# Patient Record
Sex: Female | Born: 1980
Health system: Southern US, Community
[De-identification: ages and names within clinical notes are randomized; demographics above are authoritative.]

## PROBLEM LIST (undated history)

## (undated) DIAGNOSIS — R600 Localized edema: Secondary | ICD-10-CM

## (undated) DIAGNOSIS — Z0000 Encounter for general adult medical examination without abnormal findings: Secondary | ICD-10-CM

## (undated) DIAGNOSIS — Z8759 Personal history of other complications of pregnancy, childbirth and the puerperium: Secondary | ICD-10-CM

## (undated) DIAGNOSIS — E669 Obesity, unspecified: Secondary | ICD-10-CM

## (undated) HISTORY — DX: Localized edema: R60.0

## (undated) HISTORY — DX: Personal history of other complications of pregnancy, childbirth and the puerperium: Z87.59

## (undated) HISTORY — DX: Obesity, unspecified: E66.9

## (undated) HISTORY — PX: DILATION AND CURETTAGE OF UTERUS: SHX78

## (undated) HISTORY — DX: Encounter for general adult medical examination without abnormal findings: Z00.00

---

## 2014-08-12 ENCOUNTER — Emergency Department (HOSPITAL_BASED_OUTPATIENT_CLINIC_OR_DEPARTMENT_OTHER)
Admission: EM | Admit: 2014-08-12 | Discharge: 2014-08-12 | Disposition: A | Discharge status: Home / Self Care | Payer: 59 | Attending: Emergency Medicine | Admitting: Emergency Medicine

## 2014-08-12 ENCOUNTER — Encounter (HOSPITAL_BASED_OUTPATIENT_CLINIC_OR_DEPARTMENT_OTHER): Payer: Self-pay

## 2014-08-12 DIAGNOSIS — Z3202 Encounter for pregnancy test, result negative: Secondary | ICD-10-CM | POA: Insufficient documentation

## 2014-08-12 DIAGNOSIS — R1909 Other intra-abdominal and pelvic swelling, mass and lump: Secondary | ICD-10-CM | POA: Diagnosis not present

## 2014-08-12 LAB — URINALYSIS, ROUTINE W REFLEX MICROSCOPIC
BILIRUBIN URINE: NEGATIVE
GLUCOSE, UA: NEGATIVE mg/dL
HGB URINE DIPSTICK: NEGATIVE
KETONES UR: NEGATIVE mg/dL
Leukocytes, UA: NEGATIVE
Nitrite: NEGATIVE
PROTEIN: NEGATIVE mg/dL
Specific Gravity, Urine: 1.03 (ref 1.005–1.030)
Urobilinogen, UA: 0.2 mg/dL (ref 0.0–1.0)
pH: 6.5 (ref 5.0–8.0)

## 2014-08-12 LAB — WET PREP, GENITAL
Trich, Wet Prep: NONE SEEN
Yeast Wet Prep HPF POC: NONE SEEN

## 2014-08-12 LAB — PREGNANCY, URINE: Preg Test, Ur: NEGATIVE

## 2014-08-12 NOTE — ED Provider Notes (Signed)
CSN: [id number]     Arrival date & time 08/12/14  1406 History   First MD Initiated Contact with Patient 08/12/14 1507     Chief Complaint  Patient presents with  . Groin Swelling     (Consider location/radiation/quality/duration/timing/severity/associated sxs/prior Treatment) HPI  Pt presenting with c/o area of swelling in her vaginal area.  She states that when she was showering she noted a swollen area.  No pain.  No drainage.  Sates she first noted this today.  No vaginal discharge.  No dysuria.  No fever/chills.  No abdominal pain.  Symptoms are continuous.  Discharged with strict return precautions.  Pt agreeable with plan.  History reviewed. No pertinent past medical history. History reviewed. No pertinent past surgical history. No family history on file. History  Substance Use Topics  . Smoking status: Never Smoker   . Smokeless tobacco: Not on file  . Alcohol Use: No   OB History    No data available     Review of Systems  ROS reviewed and all otherwise negative except for mentioned in HPI    Allergies  Review of patient's allergies indicates no known allergies.  Home Medications   Prior to Admission medications   Not on File   BP 152/88 mmHg  Pulse 95  Temp(Src) 97.1 F (36.2 C) (Oral)  Resp 16  Ht 5\' 9"  (1.753 m)  Wt 283 lb (128.368 kg)  BMI 41.77 kg/m2  SpO2 100%  LMP 07/25/2014 (Exact Date)  Vitals reviewed Physical Exam  Physical Examination: General appearance - alert, well appearing, and in no distress Mental status - alert, oriented to person, place, and time Eyes - no conjunctival injection, no scleral icterus Mouth - mucous membranes moist, pharynx normal without lesions Chest - clear to auscultation, no wheezes, rales or rhonchi, symmetric air entry Heart - normal rate, regular rhythm, normal S1, S2, no murmurs, rubs, clicks or gallops Abdomen - soft, nontender, nondistended, no masses or organomegaly Pelvic - left labia minora with  approx 0.5cm are of swelling- no abrasion or break in skin, nontender to palpation, no overlying erythema or rash, normal vagina, cervix, uterus and adnexa Skin - normal coloration and turgor, no rashes  ED Course  Procedures (including critical care time) Labs Review Labs Reviewed  WET PREP, GENITAL - Abnormal; Notable for the following:    Clue Cells Wet Prep HPF POC FEW (*)    WBC, Wet Prep HPF POC FEW (*)    All other components within normal limits  GC/CHLAMYDIA PROBE AMP  URINALYSIS, ROUTINE W REFLEX MICROSCOPIC  PREGNANCY, URINE    Imaging Review No results found.   EKG Interpretation None      MDM   Final diagnoses:  Groin swelling  labia minora swelling  Pt presenting with c/o area of swelling on labia.  On exam the labia minora has a focal area of swelling- nontender, no erythema, no rash or break in skin.  Based on this exam doubt abscess, herpes, no bartholin's cyst.  Pt advised ice pack to area, and given information for gynecology if symptoms persist.  Discharged with strict return precautions.  Pt agreeable with plan.    Ethelda ChickMartha K Linker, MD 08/12/14 [phone]

## 2014-08-12 NOTE — ED Notes (Signed)
Pt c/o woke with swelling to vaginal area-denies pain, vaginal d/c, injury

## 2014-08-12 NOTE — Discharge Instructions (Signed)
Return to the ED with any concerns including fever/chills, drainage from area, increased swelling or pain, or any other alarming symptoms

## 2014-08-13 LAB — GC/CHLAMYDIA PROBE AMP
CT Probe RNA: POSITIVE — AB
GC Probe RNA: NEGATIVE

## 2014-08-16 ENCOUNTER — Telehealth (HOSPITAL_BASED_OUTPATIENT_CLINIC_OR_DEPARTMENT_OTHER): Payer: Self-pay | Admitting: Emergency Medicine

## 2014-08-17 ENCOUNTER — Telehealth (HOSPITAL_BASED_OUTPATIENT_CLINIC_OR_DEPARTMENT_OTHER): Payer: Self-pay | Admitting: Emergency Medicine

## 2014-08-17 ENCOUNTER — Telehealth (HOSPITAL_BASED_OUTPATIENT_CLINIC_OR_DEPARTMENT_OTHER): Payer: Self-pay | Admitting: *Deleted

## 2014-08-17 NOTE — Telephone Encounter (Signed)
Post ED Visit - Positive Culture Follow-up: Successful Patient Follow-Up  Culture assessed and recommendations reviewed by: []  Wes Dulaney, Pharm.D., BCPS []  Celedonio MiyamotoJeremy Frens, Pharm.D., BCPS []  Georgina PillionElizabeth Martin, Pharm.D., BCPS []  LilburnMinh Pham, [address] Rainbow BoulevardPharm.D., BCPS, AAHIVP []  Estella HuskMichelle Turner, Pharm.D., BCPS, AAHIVP []  Red ChristiansSamson Lee, Pharm.D. []  Tennis Mustassie Stewart, Pharm.D.  Positive chlamydia culture  [x]  Patient discharged without antimicrobial prescription and treatment is now indicated []  Organism is resistant to prescribed ED discharge antimicrobial []  Patient with positive blood cultures  Changes discussed with ED provider: Arthor CaptainAbigail Harris PA New antibiotic prescription Azithromycin 1 gram po x 1 [facility] [address]  Contacted patient, date 08/17/14 1400   Berle MullMiller, Bethenny Losee 08/17/2014, 3:18 PM

## 2014-09-13 ENCOUNTER — Emergency Department (HOSPITAL_BASED_OUTPATIENT_CLINIC_OR_DEPARTMENT_OTHER)
Admission: EM | Admit: 2014-09-13 | Discharge: 2014-09-13 | Disposition: A | Discharge status: Home / Self Care | Payer: 59 | Attending: Emergency Medicine | Admitting: Emergency Medicine

## 2014-09-13 ENCOUNTER — Emergency Department (HOSPITAL_BASED_OUTPATIENT_CLINIC_OR_DEPARTMENT_OTHER): Payer: 59

## 2014-09-13 ENCOUNTER — Encounter (HOSPITAL_BASED_OUTPATIENT_CLINIC_OR_DEPARTMENT_OTHER): Payer: Self-pay | Admitting: *Deleted

## 2014-09-13 DIAGNOSIS — Z79899 Other long term (current) drug therapy: Secondary | ICD-10-CM | POA: Diagnosis not present

## 2014-09-13 DIAGNOSIS — Z3A00 Weeks of gestation of pregnancy not specified: Secondary | ICD-10-CM | POA: Insufficient documentation

## 2014-09-13 DIAGNOSIS — Z90710 Acquired absence of both cervix and uterus: Secondary | ICD-10-CM | POA: Insufficient documentation

## 2014-09-13 DIAGNOSIS — Z87891 Personal history of nicotine dependence: Secondary | ICD-10-CM | POA: Diagnosis not present

## 2014-09-13 DIAGNOSIS — O200 Threatened abortion: Secondary | ICD-10-CM | POA: Insufficient documentation

## 2014-09-13 DIAGNOSIS — O209 Hemorrhage in early pregnancy, unspecified: Secondary | ICD-10-CM | POA: Diagnosis present

## 2014-09-13 DIAGNOSIS — O039 Complete or unspecified spontaneous abortion without complication: Secondary | ICD-10-CM

## 2014-09-13 LAB — CBC WITH DIFFERENTIAL/PLATELET
BASOS ABS: 0 10*3/uL (ref 0.0–0.1)
BASOS PCT: 0 % (ref 0–1)
EOS ABS: 0.1 10*3/uL (ref 0.0–0.7)
EOS PCT: 2 % (ref 0–5)
HCT: 36.9 % (ref 36.0–46.0)
HEMOGLOBIN: 12.1 g/dL (ref 12.0–15.0)
LYMPHS ABS: 1.3 10*3/uL (ref 0.7–4.0)
Lymphocytes Relative: 27 % (ref 12–46)
MCH: 28.5 pg (ref 26.0–34.0)
MCHC: 32.8 g/dL (ref 30.0–36.0)
MCV: 87 fL (ref 78.0–100.0)
MONO ABS: 0.5 10*3/uL (ref 0.1–1.0)
MONOS PCT: 10 % (ref 3–12)
NEUTROS ABS: 3 10*3/uL (ref 1.7–7.7)
Neutrophils Relative %: 61 % (ref 43–77)
Platelets: 219 10*3/uL (ref 150–400)
RBC: 4.24 MIL/uL (ref 3.87–5.11)
RDW: 13.8 % (ref 11.5–15.5)
WBC: 4.8 10*3/uL (ref 4.0–10.5)

## 2014-09-13 LAB — RH IG WORKUP (INCLUDES ABO/RH)
ABO/RH(D): O POS
Gestational Age(Wks): 6

## 2014-09-13 LAB — URINALYSIS, ROUTINE W REFLEX MICROSCOPIC
Glucose, UA: NEGATIVE mg/dL
Ketones, ur: >80 mg/dL — AB
LEUKOCYTES UA: NEGATIVE
Nitrite: NEGATIVE
Protein, ur: 30 mg/dL — AB
Specific Gravity, Urine: 1.035 — ABNORMAL HIGH (ref 1.005–1.030)
Urobilinogen, UA: 0.2 mg/dL (ref 0.0–1.0)
pH: 6 (ref 5.0–8.0)

## 2014-09-13 LAB — URINE MICROSCOPIC-ADD ON

## 2014-09-13 LAB — BASIC METABOLIC PANEL
Anion gap: 5 (ref 5–15)
BUN: 10 mg/dL (ref 6–23)
CHLORIDE: 105 mmol/L (ref 96–112)
CO2: 24 mmol/L (ref 19–32)
Calcium: 8.5 mg/dL (ref 8.4–10.5)
Creatinine, Ser: 0.9 mg/dL (ref 0.50–1.10)
GFR, EST NON AFRICAN AMERICAN: 84 mL/min — AB (ref >90)
GLUCOSE: 100 mg/dL — AB (ref 70–99)
Potassium: 3.8 mmol/L (ref 3.5–5.1)
SODIUM: 134 mmol/L — AB (ref 135–145)

## 2014-09-13 LAB — PREGNANCY, URINE: Preg Test, Ur: POSITIVE — AB

## 2014-09-13 LAB — HCG, QUANTITATIVE, PREGNANCY: HCG, BETA CHAIN, QUANT, S: 17648 m[IU]/mL — AB (ref <5)

## 2014-09-13 MED ORDER — OXYMETAZOLINE HCL 0.05 % NA SOLN: 1.0000 | Freq: Once | NASAL | Status: DC

## 2014-09-13 NOTE — ED Notes (Signed)
Pos preg test- vaginal bleeding x 3 days

## 2014-09-13 NOTE — Discharge Instructions (Signed)
As discussed follow up with the ob in 3 days as planned. If you start to bleed more then one pad per hour follow up with your doctor or [facility] Miscarriage A miscarriage is the sudden loss of an unborn baby (fetus) before the 20th week of pregnancy. Most miscarriages happen in the first 3 months of pregnancy. Sometimes, it happens before a woman even knows she is pregnant. A miscarriage is also called a "spontaneous miscarriage" or "early pregnancy loss." Having a miscarriage can be an emotional experience. Talk with your caregiver about any questions you may have about miscarrying, the grieving process, and your future pregnancy plans. CAUSES   Problems with the fetal chromosomes that make it impossible for the baby to develop normally. Problems with the baby's genes or chromosomes are most often the result of errors that occur, by chance, as the embryo divides and grows. The problems are not inherited from the parents.  Infection of the cervix or uterus.   Hormone problems.   Problems with the cervix, such as having an incompetent cervix. This is when the tissue in the cervix is not strong enough to hold the pregnancy.   Problems with the uterus, such as an abnormally shaped uterus, uterine fibroids, or congenital abnormalities.   Certain medical conditions.   Smoking, drinking alcohol, or taking illegal drugs.   Trauma.  Often, the cause of a miscarriage is unknown.  SYMPTOMS   Vaginal bleeding or spotting, with or without cramps or pain.  Pain or cramping in the abdomen or lower back.  Passing fluid, tissue, or blood clots from the vagina. DIAGNOSIS  Your caregiver will perform a physical exam. You may also have an ultrasound to confirm the miscarriage. Blood or urine tests may also be ordered. TREATMENT   Sometimes, treatment is not necessary if you naturally pass all the fetal tissue that was in the uterus. If some of the fetus or placenta remains in the  body (incomplete miscarriage), tissue left behind may become infected and must be removed. Usually, a dilation and curettage (D and C) procedure is performed. During a D and C procedure, the cervix is widened (dilated) and any remaining fetal or placental tissue is gently removed from the uterus.  Antibiotic medicines are prescribed if there is an infection. Other medicines may be given to reduce the size of the uterus (contract) if there is a lot of bleeding.  If you have Rh negative blood and your baby was Rh positive, you will need a Rh immunoglobulin shot. This shot will protect any future baby from having Rh blood problems in future pregnancies. HOME CARE INSTRUCTIONS   Your caregiver may order bed rest or may allow you to continue light activity. Resume activity as directed by your caregiver.  Have someone help with home and family responsibilities during this time.   Keep track of the number of sanitary pads you use each day and how soaked (saturated) they are. Write down this information.   Do not use tampons. Do not douche or have sexual intercourse until approved by your caregiver.   Only take over-the-counter or prescription medicines for pain or discomfort as directed by your caregiver.   Do not take aspirin. Aspirin can cause bleeding.   Keep all follow-up appointments with your caregiver.   If you or your partner have problems with grieving, talk to your caregiver or seek counseling to help cope with the pregnancy loss. Allow enough time to grieve before trying to get  pregnant again.  SEEK IMMEDIATE MEDICAL CARE IF:   You have severe cramps or pain in your back or abdomen.  You have a fever.  You pass large blood clots (walnut-sized or larger) ortissue from your vagina. Save any tissue for your caregiver to inspect.   Your bleeding increases.   You have a thick, bad-smelling vaginal discharge.  You become lightheaded, weak, or you faint.   You have chills.   MAKE SURE YOU:  Understand these instructions.  Will watch your condition.  Will get help right away if you are not doing well or get worse. Document Released: 01/30/2001 Document Revised: 12/01/2012 Document Reviewed: 09/25/2011 [facility] Patient Information 2015 [place], [place]. This information is not intended to replace advice given to you by your health care provider. Make sure you discuss any questions you have with your health care provider.

## 2014-09-13 NOTE — ED Notes (Signed)
D/c home with sig other

## 2014-09-13 NOTE — ED Provider Notes (Signed)
CSN: [id number]     Arrival date & time 09/13/14  4098 History   First MD Initiated Contact with Patient 09/13/14 [id number]     Chief Complaint  Patient presents with  . Vaginal Bleeding     (Consider location/radiation/quality/duration/timing/severity/associated sxs/prior Treatment) HPI Comments: Pt was seen by ob last week and confirmed pregnancy. Then stated bleeding 5 days ago and was seen again had a normal ultrasound and was started on progesterone. No pain. G4p0. Pt is unsure of blood type  Patient is a 34 y.o. female presenting with vaginal bleeding. The history is provided by the patient. No language interpreter was used.  Vaginal Bleeding Quality:  Passed tissue and dark red Severity:  Mild Onset quality:  Gradual Duration:  5 days Timing:  Constant Progression:  Unchanged Chronicity:  New Associated symptoms: no abdominal pain and no dysuria     History reviewed. No pertinent past medical history. Past Surgical History  Procedure Laterality Date  . Dilation and curettage of uterus     No family history on file. History  Substance Use Topics  . Smoking status: Former Smoker    Types: Cigars    Quit date: 07/29/2014  . Smokeless tobacco: Never Used  . Alcohol Use: No   OB History    Gravida Para Term Preterm AB TAB SAB Ectopic Multiple Living   Review of Systems  Gastrointestinal: Negative for abdominal pain.  Genitourinary: Positive for vaginal bleeding. Negative for dysuria.  All other systems reviewed and are negative.     Allergies  Review of patient's allergies indicates no known allergies.  Home Medications   Prior to Admission medications   Medication Sig Start Date End Date Taking? Authorizing Provider  Prenatal Vit-Fe Fumarate-FA (PRENATAL MULTIVITAMIN) TABS tablet Take 1 tablet by mouth daily at 12 noon.   Yes Historical Provider, MD  Progesterone Micronized (PROGESTERONE PO) Take by mouth.   Yes Historical Provider, MD   BP  144/91 mmHg  Pulse 103  Temp(Src) 99.5 F (37.5 C) (Oral)  Resp 18  Ht  (1.753 m)  Wt 293 lb (132.904 kg)  BMI 43.25 kg/m2  SpO2 98%  LMP 07/22/2014 Physical Exam  Constitutional: She is oriented to person, place, and time. She appears well-developed and well-nourished.  Cardiovascular: Normal rate and regular rhythm.   Pulmonary/Chest: Effort normal and breath sounds normal.  Abdominal: Soft. Bowel sounds are normal. There is no tenderness.  Genitourinary:  Small clots noted in vaginal vault os is closed  Musculoskeletal: Normal range of motion.  Neurological: She is alert and oriented to person, place, and time.  Skin: Skin is warm and dry.  Psychiatric: She has a normal mood and affect.  Nursing note and vitals reviewed.   ED Course  Procedures (including critical care time) Labs Review Labs Reviewed  PREGNANCY, URINE - Abnormal; Notable for the following:    Preg Test, Ur POSITIVE (*)    All other components within normal limits  URINALYSIS, ROUTINE W REFLEX MICROSCOPIC - Abnormal; Notable for the following:    Color, Urine AMBER (*)    APPearance CLOUDY (*)    Specific Gravity, Urine 1.035 (*)    Hgb urine dipstick LARGE (*)    Bilirubin Urine SMALL (*)    Ketones, ur >80 (*)    Protein, ur 30 (*)    All other components within normal limits  BASIC METABOLIC PANEL - Abnormal; Notable for  the following:    Sodium 134 (*)    Glucose, Bld 100 (*)    GFR calc non Af Amer 84 (*)    All other components within normal limits  HCG, QUANTITATIVE, PREGNANCY - Abnormal; Notable for the following:    hCG, Beta Chain, Quant, S [id number] (*)    All other components within normal limits  URINE MICROSCOPIC-ADD ON - Abnormal; Notable for the following:    Squamous Epithelial / LPF MANY (*)    Bacteria, UA MANY (*)    All other components within normal limits  CBC WITH DIFFERENTIAL/PLATELET  RH IG WORKUP (INCLUDES ABO/RH)    Imaging Review [place] Ob Comp Less 14  Wks  09/13/2014   CLINICAL DATA:  Vaginal bleeding with clots  EXAM: OBSTETRIC <14 WK US AND TRANSVAGINAL OB US  TECHNIQUE: Both transabdominal and transvaginal ultrasound examinations were performed for complete evaluation of the gestation as well as the maternal uterus, adnexal regions, and pelvic cul-de-sac. Transvaginal technique was performed to assess early pregnancy.  COMPARISON:  None.  FINDINGS: Intrauterine gestational sac: Visualized/normal in shape.  Yolk sac:  Not visualized.  Embryo:  Present.  Cardiac Activity: None.  Heart Rate:   bpm  CRL:   8.2  mm   6 w 5d                  US EDC: 05/04/2015  Maternal uterus/adnexae: 2.2 x 1.8 x 1.6 cm hypoechoic right ovarian mass likely representing a corpus luteum cyst of pregnancy. Left ovary is normal in size measuring 2.9 x 1.9 x 2.4 cm transabdominally. There are 2 hypoechoic uterine masses measuring 1.4 x 1.2 x 1 cm in the posterior body and a 1.2 x 1.3 x 0.9 cm mass in the fundus most consistent with uterine fibroids.  IMPRESSION: 1. Intrauterine gestational sac without fetal cardiac activity. By crown-rump length, of the pregnancy dates 6 weeks 5 days. Findings are suspicious but not yet definitive for failed pregnancy. Recommend follow-up US in 10-14 days for definitive diagnosis. This recommendation follows SRU consensus guidelines: Diagnostic Criteria for Nonviable Pregnancy Early in the First Trimester. Malva Limes Engl J Med 2013; 604:5409-81; 369:1443-51.   Electronically Signed   By: Elige KoHetal  Patel   On: 09/13/2014 10:23   [place] Ob Transvaginal  09/13/2014   CLINICAL DATA:  Vaginal bleeding with clots  EXAM: OBSTETRIC <14 WK US AND TRANSVAGINAL OB US  TECHNIQUE: Both transabdominal and transvaginal ultrasound examinations were performed for complete evaluation of the gestation as well as the maternal uterus, adnexal regions, and pelvic cul-de-sac. Transvaginal technique was performed to assess early pregnancy.  COMPARISON:  None.  FINDINGS: Intrauterine gestational sac:  Visualized/normal in shape.  Yolk sac:  Not visualized.  Embryo:  Present.  Cardiac Activity: None.  Heart Rate:   bpm  CRL:   8.2  mm   6 w 5d                  US EDC: 05/04/2015  Maternal uterus/adnexae: 2.2 x 1.8 x 1.6 cm hypoechoic right ovarian mass likely representing a corpus luteum cyst of pregnancy. Left ovary is normal in size measuring 2.9 x 1.9 x 2.4 cm transabdominally. There are 2 hypoechoic uterine masses measuring 1.4 x 1.2 x 1 cm in the posterior body and a 1.2 x 1.3 x 0.9 cm mass in the fundus most consistent with uterine fibroids.  IMPRESSION: 1. Intrauterine gestational sac without fetal cardiac activity. By crown-rump length, of the pregnancy dates 6 weeks  5 days. Findings are suspicious but not yet definitive for failed pregnancy. Recommend follow-up US in 10-14 days for definitive diagnosis. This recommendation follows SRU consensus guidelines: Diagnostic Criteria for Nonviable Pregnancy Early in the First Trimester. Malva Limes Med 2013; 161:0960-45.   Electronically Signed   By: Elige Ko   On: 09/13/2014 10:23     EKG Interpretation None      MDM   Final diagnoses:  Bleeding in early pregnancy  Miscarriage    Baby had hr last week. Non today. Discussed miscarriage and return precautions with pt . Pt has follow up with ov in 3 days. Pt is o pos so no rhogam needed.    Teressa Lower, NP 09/13/14 1254  Gwyneth Sprout, MD 09/13/14 1319

## 2014-09-13 NOTE — ED Notes (Signed)
NP at bedside.

## 2014-09-13 NOTE — ED Notes (Signed)
Patient transported to Ultrasound 

## 2014-09-20 ENCOUNTER — Encounter: Payer: Self-pay | Admitting: Family Medicine

## 2014-09-20 ENCOUNTER — Ambulatory Visit (INDEPENDENT_AMBULATORY_CARE_PROVIDER_SITE_OTHER): Payer: 59 | Admitting: Family Medicine

## 2014-09-20 ENCOUNTER — Telehealth: Payer: Self-pay

## 2014-09-20 VITALS — BP 120/82 | HR 87 | Temp 98.0°F | Ht 68.5 in | Wt 295.6 lb

## 2014-09-20 DIAGNOSIS — Z8759 Personal history of other complications of pregnancy, childbirth and the puerperium: Secondary | ICD-10-CM

## 2014-09-20 DIAGNOSIS — O039 Complete or unspecified spontaneous abortion without complication: Secondary | ICD-10-CM

## 2014-09-20 DIAGNOSIS — E669 Obesity, unspecified: Secondary | ICD-10-CM

## 2014-09-20 HISTORY — DX: Obesity, unspecified: E66.9

## 2014-09-20 HISTORY — DX: Personal history of other complications of pregnancy, childbirth and the puerperium: Z87.59

## 2014-09-20 LAB — HCG, SERUM, QUALITATIVE: Preg, Serum: POSITIVE

## 2014-09-20 LAB — CBC
HEMATOCRIT: 35.7 % — AB (ref 36.0–46.0)
HEMOGLOBIN: 12 g/dL (ref 12.0–15.0)
MCHC: 33.6 g/dL (ref 30.0–36.0)
MCV: 85.7 fl (ref 78.0–100.0)
Platelets: 223 10*3/uL (ref 150.0–400.0)
RBC: 4.17 Mil/uL (ref 3.87–5.11)
RDW: 15.2 % (ref 11.5–15.5)
WBC: 5.5 10*3/uL (ref 4.0–10.5)

## 2014-09-20 LAB — HCG, QUANTITATIVE, PREGNANCY: Quantitative HCG: 10859 m[IU]/mL

## 2014-09-20 NOTE — Progress Notes (Signed)
Pre visit review using our clinic review tool, if applicable. No additional management support is needed unless otherwise documented below in the visit note. 

## 2014-09-20 NOTE — Progress Notes (Signed)
Sandy Love  [id number] May 18, 1981 09/20/2014      Progress Note-Follow Up  Subjective  Chief Complaint  Chief Complaint  Patient presents with  . Abdominal Pain    started this am and alot of discharge of the tissue    HPI  Patient is a 34 y.o. female in today for routine medical care. Was seen by her GYN for initial pregnancy evaluation on 1/21 was having some spotting but did have a heart beat. Then over next few days had more clotting, cramping. Trip to ED confirmed no heart beat on 1/25. Since then she has felt well except continues to pass some blood and clots intermittently. Had some intense cramping this am then passed more tissue. Feels well now. Not sexually active these 2 weeks. No f/c/malaise/anorexia/GU or GU c/o. No CP/palp/sob. This is her 3 rd miscarriage without any pregnancies carried to term. Otherwise feels well  Past Medical History  Diagnosis Date  . Obesity 09/20/2014    Past Surgical History  Procedure Laterality Date  . Dilation and curettage of uterus      History reviewed. No pertinent family history.  History   Social History  . Marital Status: Single    Spouse Name: N/A    Number of Children: N/A  . Years of Education: N/A   Occupational History  . Not on file.   Social History Main Topics  . Smoking status: Former Smoker    Types: Cigars    Quit date: 07/29/2014  . Smokeless tobacco: Never Used  . Alcohol Use: No  . Drug Use: No  . Sexual Activity: Yes    Birth Control/ Protection: None   Other Topics Concern  . Not on file   Social History Narrative    Current Outpatient Prescriptions on File Prior to Visit  Medication Sig Dispense Refill  . Prenatal Vit-Fe Fumarate-FA (PRENATAL MULTIVITAMIN) TABS tablet Take 1 tablet by mouth daily at 12 noon.    . Progesterone Micronized (PROGESTERONE PO) Take by mouth.     No current facility-administered medications on file prior to visit.    No Known Allergies  Review of  Systems  Review of Systems  Constitutional: Negative for fever and malaise/fatigue.  HENT: Negative for congestion.   Eyes: Negative for discharge.  Respiratory: Negative for shortness of breath.   Cardiovascular: Negative for chest pain, palpitations and leg swelling.  Gastrointestinal: Positive for abdominal pain. Negative for nausea and diarrhea.  Genitourinary: Negative for dysuria.  Musculoskeletal: Negative for falls.  Skin: Negative for rash.  Neurological: Negative for loss of consciousness and headaches.  Endo/Heme/Allergies: Negative for polydipsia.  Psychiatric/Behavioral: Negative for depression and suicidal ideas. The patient is not nervous/anxious and does not have insomnia.     Objective  BP 120/82 mmHg  Pulse 87  Temp(Src) 98 F (36.7 C) (Oral)  Ht 5' 8.5" (1.74 m)  Wt 295 lb 9.6 oz (134.083 kg)  BMI 44.29 kg/m2  SpO2 100%  LMP 07/22/2014  Breastfeeding? Unknown  Physical Exam  Physical Exam  Constitutional: She is oriented to person, place, and time and well-developed, well-nourished, and in no distress. No distress.  HENT:  Head: Normocephalic and atraumatic.  Eyes: Conjunctivae are normal.  Neck: Neck supple. No thyromegaly present.  Cardiovascular: Normal rate, regular rhythm and normal heart sounds.   No murmur heard. Pulmonary/Chest: Effort normal and breath sounds normal. She has no wheezes.  Abdominal: Soft. Bowel sounds are normal. She exhibits no distension and no mass. There is  no tenderness. There is no rebound and no guarding.  Musculoskeletal: She exhibits no edema.  Lymphadenopathy:    She has no cervical adenopathy.  Neurological: She is alert and oriented to person, place, and time.  Skin: Skin is warm and dry. No rash noted. She is not diaphoretic.  Psychiatric: Memory, affect and judgment normal.    No results found for: TSH Lab Results  Component Value Date   WBC 4.8 09/13/2014   HGB 12.1 09/13/2014   HCT 36.9 09/13/2014    MCV 87.0 09/13/2014   PLT 219 09/13/2014   Lab Results  Component Value Date   CREATININE 0.90 09/13/2014   BUN 10 09/13/2014   NA 134* 09/13/2014   K 3.8 09/13/2014   CL 105 09/13/2014   CO2 24 09/13/2014   No results found for: ALT, AST, GGT, ALKPHOS, BILITOT No results found for: CHOL No results found for: HDL No results found for: LDLCALC No results found for: TRIG No results found for: CHOLHDL   Assessment & Plan  Miscarriage Has been spotting then passing tissue and clots off and on x 11 days. Not bleeding profusely but did have some intense cramps this am and then passed a significant amount of tissue. Feels well at this time. Has an appt with her GYN Dr Cherly Hensenousins later today. Offered reassurance that exam and vitals all reassuring. Ordered stat CBC and quant and Qual pregnancy tests for evaluation by GYN this afternoon. Seek further care as clinically indicated   Obesity Encouraged DASH diet, decrease po intake and increase exercise as tolerated. Eat small, frequent meals every 4-5 hours with lean proteins, complex carbs and healthy fats. Minimize simple carbs

## 2014-09-20 NOTE — Assessment & Plan Note (Signed)
Has been spotting then passing tissue and clots off and on x 11 days. Not bleeding profusely but did have some intense cramps this am and then passed a significant amount of tissue. Feels well at this time. Has an appt with her GYN Dr Cherly Hensenousins later today. Offered reassurance that exam and vitals all reassuring. Ordered stat CBC and quant and Qual pregnancy tests for evaluation by GYN this afternoon. Seek further care as clinically indicated

## 2014-09-20 NOTE — Telephone Encounter (Signed)
Robin from [facility] called with STAT pregnancy results.  Results:  Pregnancy test-positive.

## 2014-09-20 NOTE — Patient Instructions (Signed)
Miscarriage °A miscarriage is the loss of an unborn baby (fetus) before the 20th week of pregnancy. The cause is often unknown.  °HOME CARE °· You may need to stay in bed (bed rest), or you may be able to do light activity. Go about activity as told by your doctor. °· Have help at home. °· Write down how many pads you use each day. Write down how soaked they are. °· Do not use tampons. Do not wash out your vagina (douche) or have sex (intercourse) until your doctor approves. °· Only take medicine as told by your doctor. °· Do not take aspirin. °· Keep all doctor visits as told. °· If you or your partner have problems with grieving, talk to your doctor. You can also try counseling. Give yourself time to grieve before trying to get pregnant again. °GET HELP RIGHT AWAY IF: °· You have bad cramps or pain in your back or belly (abdomen). °· You have a fever. °· You pass large clumps of blood (clots) from your vagina that are walnut-sized or larger. Save the clumps for your doctor to see. °· You pass large amounts of tissue from your vagina. Save the tissue for your doctor to see. °· You have more bleeding. °· You have thick, bad-smelling fluid (discharge) coming from the vagina. °· You get lightheaded, weak, or you pass out (faint). °· You have chills. °MAKE SURE YOU: °· Understand these instructions. °· Will watch your condition. °· Will get help right away if you are not doing well or get worse. °Document Released: 10/29/2011 Document Reviewed: 10/29/2011 °[employer]® Patient Information ©2015 [employer]. This information is not intended to replace advice given to you by your health care provider. Make sure you discuss any questions you have with your health care provider. ° °

## 2014-09-20 NOTE — Assessment & Plan Note (Addendum)
Encouraged DASH diet, decrease po intake and increase exercise as tolerated. Eat small, frequent meals every 4-5 hours with lean proteins, complex carbs and healthy fats. Minimize simple carbs

## 2014-09-20 NOTE — Telephone Encounter (Signed)
Notify very mild anemia keep taking th PNV

## 2014-09-21 NOTE — Telephone Encounter (Signed)
Pt notified and made aware.  She agrees to comply with recommendation.

## 2014-10-19 LAB — HM PAP SMEAR: HM Pap smear: NORMAL

## 2014-11-02 ENCOUNTER — Telehealth: Payer: Self-pay | Admitting: Family Medicine

## 2014-11-02 NOTE — Telephone Encounter (Signed)
Lab test to be ordered along with diagnosis are on PCP's desk.

## 2014-11-02 NOTE — Telephone Encounter (Signed)
Patient informed and will provide order/diagnosis for PCP to enter.

## 2014-11-02 NOTE — Telephone Encounter (Signed)
Caller name:Wickes, Almeda Relation to ZO:XWRUpt:self Call back [phone] Pharmacy:  Reason for call: pt has an appt to est new pt with Dr. Abner GreenspanBlyth, appt is not until 04/26/15, however pt needs labs done per her OBGYN, pt would like to know if she can have the labs done here and the OBGYN doctor will fax over the rx for dr. Abner GreenspanBlyth.

## 2014-11-02 NOTE — Telephone Encounter (Signed)
I have seen her urgently already so she is in the system. I am fine to order her labs. Just have her GYN fax us the labs they want and for what diagnosis and I can enter.

## 2014-11-03 NOTE — Telephone Encounter (Signed)
Pt is following up on the lab orders

## 2014-11-04 ENCOUNTER — Other Ambulatory Visit: Payer: Self-pay | Admitting: *Deleted

## 2014-11-04 DIAGNOSIS — Z130 Encounter for screening for diseases of the blood and blood-forming organs and certain disorders involving the immune mechanism: Secondary | ICD-10-CM

## 2014-11-04 NOTE — Telephone Encounter (Signed)
Labs have been orderd by [facility]

## 2014-11-05 ENCOUNTER — Ambulatory Visit (INDEPENDENT_AMBULATORY_CARE_PROVIDER_SITE_OTHER): Payer: 59 | Admitting: Family Medicine

## 2014-11-05 ENCOUNTER — Encounter: Payer: Self-pay | Admitting: Family Medicine

## 2014-11-05 VITALS — BP 122/82 | HR 94 | Temp 98.4°F | Ht 69.0 in | Wt 297.4 lb

## 2014-11-05 DIAGNOSIS — Z8742 Personal history of other diseases of the female genital tract: Secondary | ICD-10-CM | POA: Diagnosis not present

## 2014-11-05 DIAGNOSIS — E669 Obesity, unspecified: Secondary | ICD-10-CM

## 2014-11-05 DIAGNOSIS — Z8759 Personal history of other complications of pregnancy, childbirth and the puerperium: Secondary | ICD-10-CM

## 2014-11-05 DIAGNOSIS — Z0000 Encounter for general adult medical examination without abnormal findings: Secondary | ICD-10-CM | POA: Diagnosis not present

## 2014-11-05 NOTE — Patient Instructions (Signed)
Preventive Care for Adults A healthy lifestyle and preventive care can promote health and wellness. Preventive health guidelines for women include the following key practices.  A routine yearly physical is a good way to check with your health care provider about your health and preventive screening. It is a chance to share any concerns and updates on your health and to receive a thorough exam.  Visit your dentist for a routine exam and preventive care every 6 months. Brush your teeth twice a day and floss once a day. Good oral hygiene prevents tooth decay and gum disease.  The frequency of eye exams is based on your age, health, family medical history, use of contact lenses, and other factors. Follow your health care provider's recommendations for frequency of eye exams.  Eat a healthy diet. Foods like vegetables, fruits, whole grains, low-fat dairy products, and lean protein foods contain the nutrients you need without too many calories. Decrease your intake of foods high in solid fats, added sugars, and salt. Eat the right amount of calories for you.Get information about a proper diet from your health care provider, if necessary.  Regular physical exercise is one of the most important things you can do for your health. Most adults should get at least 150 minutes of moderate-intensity exercise (any activity that increases your heart rate and causes you to sweat) each week. In addition, most adults need muscle-strengthening exercises on 2 or more days a week.  Maintain a healthy weight. The body mass index (BMI) is a screening tool to identify possible weight problems. It provides an estimate of body fat based on height and weight. Your health care provider can find your BMI and can help you achieve or maintain a healthy weight.For adults 20 years and older:  A BMI below 18.5 is considered underweight.  A BMI of 18.5 to 24.9 is normal.  A BMI of 25 to 29.9 is considered overweight.  A BMI of  30 and above is considered obese.  Maintain normal blood lipids and cholesterol levels by exercising and minimizing your intake of saturated fat. Eat a balanced diet with plenty of fruit and vegetables. Blood tests for lipids and cholesterol should begin at age 76 and be repeated every 5 years. If your lipid or cholesterol levels are high, you are over 50, or you are at high risk for heart disease, you may need your cholesterol levels checked more frequently.Ongoing high lipid and cholesterol levels should be treated with medicines if diet and exercise are not working.  If you smoke, find out from your health care provider how to quit. If you do not use tobacco, do not start.  Lung cancer screening is recommended for adults aged 22-80 years who are at high risk for developing lung cancer because of a history of smoking. A yearly low-dose CT scan of the lungs is recommended for people who have at least a 30-pack-year history of smoking and are a current smoker or have quit within the past 15 years. A pack year of smoking is smoking an average of 1 pack of cigarettes a day for 1 year (for example: 1 pack a day for 30 years or 2 packs a day for 15 years). Yearly screening should continue until the smoker has stopped smoking for at least 15 years. Yearly screening should be stopped for people who develop a health problem that would prevent them from having lung cancer treatment.  If you are pregnant, do not drink alcohol. If you are breastfeeding,  be very cautious about drinking alcohol. If you are not pregnant and choose to drink alcohol, do not have more than 1 drink per day. One drink is considered to be 12 ounces (355 mL) of beer, 5 ounces (148 mL) of wine, or 1.5 ounces (44 mL) of liquor.  Avoid use of street drugs. Do not share needles with anyone. Ask for help if you need support or instructions about stopping the use of drugs.  High blood pressure causes heart disease and increases the risk of  stroke. Your blood pressure should be checked at least every 1 to 2 years. Ongoing high blood pressure should be treated with medicines if weight loss and exercise do not work.  If you are 75-52 years old, ask your health care provider if you should take aspirin to prevent strokes.  Diabetes screening involves taking a blood sample to check your fasting blood sugar level. This should be done once every 3 years, after age 15, if you are within normal weight and without risk factors for diabetes. Testing should be considered at a younger age or be carried out more frequently if you are overweight and have at least 1 risk factor for diabetes.  Breast cancer screening is essential preventive care for women. You should practice "breast self-awareness." This means understanding the normal appearance and feel of your breasts and may include breast self-examination. Any changes detected, no matter how small, should be reported to a health care provider. Women in their 58s and 30s should have a clinical breast exam (CBE) by a health care provider as part of a regular health exam every 1 to 3 years. After age 16, women should have a CBE every year. Starting at age 53, women should consider having a mammogram (breast X-ray test) every year. Women who have a family history of breast cancer should talk to their health care provider about genetic screening. Women at a high risk of breast cancer should talk to their health care providers about having an MRI and a mammogram every year.  Breast cancer gene (BRCA)-related cancer risk assessment is recommended for women who have family members with BRCA-related cancers. BRCA-related cancers include breast, ovarian, tubal, and peritoneal cancers. Having family members with these cancers may be associated with an increased risk for harmful changes (mutations) in the breast cancer genes BRCA1 and BRCA2. Results of the assessment will determine the need for genetic counseling and  BRCA1 and BRCA2 testing.  Routine pelvic exams to screen for cancer are no longer recommended for nonpregnant women who are considered low risk for cancer of the pelvic organs (ovaries, uterus, and vagina) and who do not have symptoms. Ask your health care provider if a screening pelvic exam is right for you.  If you have had past treatment for cervical cancer or a condition that could lead to cancer, you need Pap tests and screening for cancer for at least 20 years after your treatment. If Pap tests have been discontinued, your risk factors (such as having a new sexual partner) need to be reassessed to determine if screening should be resumed. Some women have medical problems that increase the chance of getting cervical cancer. In these cases, your health care provider may recommend more frequent screening and Pap tests.  The HPV test is an additional test that may be used for cervical cancer screening. The HPV test looks for the virus that can cause the cell changes on the cervix. The cells collected during the Pap test can be  tested for HPV. The HPV test could be used to screen women aged 30 years and older, and should be used in women of any age who have unclear Pap test results. After the age of 30, women should have HPV testing at the same frequency as a Pap test.  Colorectal cancer can be detected and often prevented. Most routine colorectal cancer screening begins at the age of 50 years and continues through age 75 years. However, your health care provider may recommend screening at an earlier age if you have risk factors for colon cancer. On a yearly basis, your health care provider may provide home test kits to check for hidden blood in the stool. Use of a small camera at the end of a tube, to directly examine the colon (sigmoidoscopy or colonoscopy), can detect the earliest forms of colorectal cancer. Talk to your health care provider about this at age 50, when routine screening begins. Direct  exam of the colon should be repeated every 5-10 years through age 75 years, unless early forms of pre-cancerous polyps or small growths are found.  People who are at an increased risk for hepatitis B should be screened for this virus. You are considered at high risk for hepatitis B if:  You were born in a country where hepatitis B occurs often. Talk with your health care provider about which countries are considered high risk.  Your parents were born in a high-risk country and you have not received a shot to protect against hepatitis B (hepatitis B vaccine).  You have HIV or AIDS.  You use needles to inject street drugs.  You live with, or have sex with, someone who has hepatitis B.  You get hemodialysis treatment.  You take certain medicines for conditions like cancer, organ transplantation, and autoimmune conditions.  Hepatitis C blood testing is recommended for all people born from 1945 through 1965 and any individual with known risks for hepatitis C.  Practice safe sex. Use condoms and avoid high-risk sexual practices to reduce the spread of sexually transmitted infections (STIs). STIs include gonorrhea, chlamydia, syphilis, trichomonas, herpes, HPV, and human immunodeficiency virus (HIV). Herpes, HIV, and HPV are viral illnesses that have no cure. They can result in disability, cancer, and death.  You should be screened for sexually transmitted illnesses (STIs) including gonorrhea and chlamydia if:  You are sexually active and are younger than 24 years.  You are older than 24 years and your health care provider tells you that you are at risk for this type of infection.  Your sexual activity has changed since you were last screened and you are at an increased risk for chlamydia or gonorrhea. Ask your health care provider if you are at risk.  If you are at risk of being infected with HIV, it is recommended that you take a prescription medicine daily to prevent HIV infection. This is  called preexposure prophylaxis (PrEP). You are considered at risk if:  You are a heterosexual woman, are sexually active, and are at increased risk for HIV infection.  You take drugs by injection.  You are sexually active with a partner who has HIV.  Talk with your health care provider about whether you are at high risk of being infected with HIV. If you choose to begin PrEP, you should first be tested for HIV. You should then be tested every 3 months for as long as you are taking PrEP.  Osteoporosis is a disease in which the bones lose minerals and strength   with aging. This can result in serious bone fractures or breaks. The risk of osteoporosis can be identified using a bone density scan. Women ages 65 years and over and women at risk for fractures or osteoporosis should discuss screening with their health care providers. Ask your health care provider whether you should take a calcium supplement or vitamin D to reduce the rate of osteoporosis.  Menopause can be associated with physical symptoms and risks. Hormone replacement therapy is available to decrease symptoms and risks. You should talk to your health care provider about whether hormone replacement therapy is right for you.  Use sunscreen. Apply sunscreen liberally and repeatedly throughout the day. You should seek shade when your shadow is shorter than you. Protect yourself by wearing long sleeves, pants, a wide-brimmed hat, and sunglasses year round, whenever you are outdoors.  Once a month, do a whole body skin exam, using a mirror to look at the skin on your back. Tell your health care provider of new moles, moles that have irregular borders, moles that are larger than a pencil eraser, or moles that have changed in shape or color.  Stay current with required vaccines (immunizations).  Influenza vaccine. All adults should be immunized every year.  Tetanus, diphtheria, and acellular pertussis (Td, Tdap) vaccine. Pregnant women should  receive 1 dose of Tdap vaccine during each pregnancy. The dose should be obtained regardless of the length of time since the last dose. Immunization is preferred during the 27th-36th week of gestation. An adult who has not previously received Tdap or who does not know her vaccine status should receive 1 dose of Tdap. This initial dose should be followed by tetanus and diphtheria toxoids (Td) booster doses every 10 years. Adults with an unknown or incomplete history of completing a 3-dose immunization series with Td-containing vaccines should begin or complete a primary immunization series including a Tdap dose. Adults should receive a Td booster every 10 years.  Varicella vaccine. An adult without evidence of immunity to varicella should receive 2 doses or a second dose if she has previously received 1 dose. Pregnant females who do not have evidence of immunity should receive the first dose after pregnancy. This first dose should be obtained before leaving the health care facility. The second dose should be obtained 4-8 weeks after the first dose.  Human papillomavirus (HPV) vaccine. Females aged 13-26 years who have not received the vaccine previously should obtain the 3-dose series. The vaccine is not recommended for use in pregnant females. However, pregnancy testing is not needed before receiving a dose. If a female is found to be pregnant after receiving a dose, no treatment is needed. In that case, the remaining doses should be delayed until after the pregnancy. Immunization is recommended for any person with an immunocompromised condition through the age of 26 years if she did not get any or all doses earlier. During the 3-dose series, the second dose should be obtained 4-8 weeks after the first dose. The third dose should be obtained 24 weeks after the first dose and 16 weeks after the second dose.  Zoster vaccine. One dose is recommended for adults aged 60 years or older unless certain conditions are  present.  Measles, mumps, and rubella (MMR) vaccine. Adults born before 1957 generally are considered immune to measles and mumps. Adults born in 1957 or later should have 1 or more doses of MMR vaccine unless there is a contraindication to the vaccine or there is laboratory evidence of immunity to   each of the three diseases. A routine second dose of MMR vaccine should be obtained at least 28 days after the first dose for students attending postsecondary schools, health care workers, or international travelers. People who received inactivated measles vaccine or an unknown type of measles vaccine during 1963-1967 should receive 2 doses of MMR vaccine. People who received inactivated mumps vaccine or an unknown type of mumps vaccine before 1979 and are at high risk for mumps infection should consider immunization with 2 doses of MMR vaccine. For females of childbearing age, rubella immunity should be determined. If there is no evidence of immunity, females who are not pregnant should be vaccinated. If there is no evidence of immunity, females who are pregnant should delay immunization until after pregnancy. Unvaccinated health care workers born before 1957 who lack laboratory evidence of measles, mumps, or rubella immunity or laboratory confirmation of disease should consider measles and mumps immunization with 2 doses of MMR vaccine or rubella immunization with 1 dose of MMR vaccine.  Pneumococcal 13-valent conjugate (PCV13) vaccine. When indicated, a person who is uncertain of her immunization history and has no record of immunization should receive the PCV13 vaccine. An adult aged 19 years or older who has certain medical conditions and has not been previously immunized should receive 1 dose of PCV13 vaccine. This PCV13 should be followed with a dose of pneumococcal polysaccharide (PPSV23) vaccine. The PPSV23 vaccine dose should be obtained at least 8 weeks after the dose of PCV13 vaccine. An adult aged 19  years or older who has certain medical conditions and previously received 1 or more doses of PPSV23 vaccine should receive 1 dose of PCV13. The PCV13 vaccine dose should be obtained 1 or more years after the last PPSV23 vaccine dose.  Pneumococcal polysaccharide (PPSV23) vaccine. When PCV13 is also indicated, PCV13 should be obtained first. All adults aged 65 years and older should be immunized. An adult younger than age 65 years who has certain medical conditions should be immunized. Any person who resides in a nursing home or long-term care facility should be immunized. An adult smoker should be immunized. People with an immunocompromised condition and certain other conditions should receive both PCV13 and PPSV23 vaccines. People with human immunodeficiency virus (HIV) infection should be immunized as soon as possible after diagnosis. Immunization during chemotherapy or radiation therapy should be avoided. Routine use of PPSV23 vaccine is not recommended for American Indians, [place] Natives, or people younger than 65 years unless there are medical conditions that require PPSV23 vaccine. When indicated, people who have unknown immunization and have no record of immunization should receive PPSV23 vaccine. One-time revaccination 5 years after the first dose of PPSV23 is recommended for people aged 19-64 years who have chronic kidney failure, nephrotic syndrome, asplenia, or immunocompromised conditions. People who received 1-2 doses of PPSV23 before age 65 years should receive another dose of PPSV23 vaccine at age 65 years or later if at least 5 years have passed since the previous dose. Doses of PPSV23 are not needed for people immunized with PPSV23 at or after age 65 years.  Meningococcal vaccine. Adults with asplenia or persistent complement component deficiencies should receive 2 doses of quadrivalent meningococcal conjugate (MenACWY-D) vaccine. The doses should be obtained at least 2 months apart.  Microbiologists working with certain meningococcal bacteria, military recruits, people at risk during an outbreak, and people who travel to or live in countries with a high rate of meningitis should be immunized. A first-year college student up through age   21 years who is living in a residence hall should receive a dose if she did not receive a dose on or after her 16th birthday. Adults who have certain high-risk conditions should receive one or more doses of vaccine.  Hepatitis A vaccine. Adults who wish to be protected from this disease, have certain high-risk conditions, work with hepatitis A-infected animals, work in hepatitis A research labs, or travel to or work in countries with a high rate of hepatitis A should be immunized. Adults who were previously unvaccinated and who anticipate close contact with an international adoptee during the first 60 days after arrival in the [place] from a country with a high rate of hepatitis A should be immunized.  Hepatitis B vaccine. Adults who wish to be protected from this disease, have certain high-risk conditions, may be exposed to blood or other infectious body fluids, are household contacts or sex partners of hepatitis B positive people, are clients or workers in certain care facilities, or travel to or work in countries with a high rate of hepatitis B should be immunized.  Haemophilus influenzae type b (Hib) vaccine. A previously unvaccinated person with asplenia or sickle cell disease or having a scheduled splenectomy should receive 1 dose of Hib vaccine. Regardless of previous immunization, a recipient of a hematopoietic stem cell transplant should receive a 3-dose series 6-12 months after her successful transplant. Hib vaccine is not recommended for adults with HIV infection. Preventive Services / Frequency Ages 64 to 68 years  Blood pressure check.** / Every 1 to 2 years.  Lipid and cholesterol check.** / Every 5 years beginning at age  22.  Clinical breast exam.** / Every 3 years for women in their 88s and 53s.  BRCA-related cancer risk assessment.** / For women who have family members with a BRCA-related cancer (breast, ovarian, tubal, or peritoneal cancers).  Pap test.** / Every 2 years from ages 90 through 51. Every 3 years starting at age 21 through age 56 or 3 with a history of 3 consecutive normal Pap tests.  HPV screening.** / Every 3 years from ages 24 through ages 1 to 46 with a history of 3 consecutive normal Pap tests.  Hepatitis C blood test.** / For any individual with known risks for hepatitis C.  Skin self-exam. / Monthly.  Influenza vaccine. / Every year.  Tetanus, diphtheria, and acellular pertussis (Tdap, Td) vaccine.** / Consult your health care provider. Pregnant women should receive 1 dose of Tdap vaccine during each pregnancy. 1 dose of Td every 10 years.  Varicella vaccine.** / Consult your health care provider. Pregnant females who do not have evidence of immunity should receive the first dose after pregnancy.  HPV vaccine. / 3 doses over 6 months, if 72 and younger. The vaccine is not recommended for use in pregnant females. However, pregnancy testing is not needed before receiving a dose.  Measles, mumps, rubella (MMR) vaccine.** / You need at least 1 dose of MMR if you were born in 1957 or later. You may also need a 2nd dose. For females of childbearing age, rubella immunity should be determined. If there is no evidence of immunity, females who are not pregnant should be vaccinated. If there is no evidence of immunity, females who are pregnant should delay immunization until after pregnancy.  Pneumococcal 13-valent conjugate (PCV13) vaccine.** / Consult your health care provider.  Pneumococcal polysaccharide (PPSV23) vaccine.** / 1 to 2 doses if you smoke cigarettes or if you have certain conditions.  Meningococcal vaccine.** /  1 dose if you are age 19 to 21 years and a first-year college  student living in a residence hall, or have one of several medical conditions, you need to get vaccinated against meningococcal disease. You may also need additional booster doses.  Hepatitis A vaccine.** / Consult your health care provider.  Hepatitis B vaccine.** / Consult your health care provider.  Haemophilus influenzae type b (Hib) vaccine.** / Consult your health care provider. Ages 40 to 64 years  Blood pressure check.** / Every 1 to 2 years.  Lipid and cholesterol check.** / Every 5 years beginning at age 20 years.  Lung cancer screening. / Every year if you are aged 55-80 years and have a 30-pack-year history of smoking and currently smoke or have quit within the past 15 years. Yearly screening is stopped once you have quit smoking for at least 15 years or develop a health problem that would prevent you from having lung cancer treatment.  Clinical breast exam.** / Every year after age 40 years.  BRCA-related cancer risk assessment.** / For women who have family members with a BRCA-related cancer (breast, ovarian, tubal, or peritoneal cancers).  Mammogram.** / Every year beginning at age 40 years and continuing for as long as you are in good health. Consult with your health care provider.  Pap test.** / Every 3 years starting at age 30 years through age 65 or 70 years with a history of 3 consecutive normal Pap tests.  HPV screening.** / Every 3 years from ages 30 years through ages 65 to 70 years with a history of 3 consecutive normal Pap tests.  Fecal occult blood test (FOBT) of stool. / Every year beginning at age 50 years and continuing until age 75 years. You may not need to do this test if you get a colonoscopy every 10 years.  Flexible sigmoidoscopy or colonoscopy.** / Every 5 years for a flexible sigmoidoscopy or every 10 years for a colonoscopy beginning at age 50 years and continuing until age 75 years.  Hepatitis C blood test.** / For all people born from 1945 through  1965 and any individual with known risks for hepatitis C.  Skin self-exam. / Monthly.  Influenza vaccine. / Every year.  Tetanus, diphtheria, and acellular pertussis (Tdap/Td) vaccine.** / Consult your health care provider. Pregnant women should receive 1 dose of Tdap vaccine during each pregnancy. 1 dose of Td every 10 years.  Varicella vaccine.** / Consult your health care provider. Pregnant females who do not have evidence of immunity should receive the first dose after pregnancy.  Zoster vaccine.** / 1 dose for adults aged 60 years or older.  Measles, mumps, rubella (MMR) vaccine.** / You need at least 1 dose of MMR if you were born in 1957 or later. You may also need a 2nd dose. For females of childbearing age, rubella immunity should be determined. If there is no evidence of immunity, females who are not pregnant should be vaccinated. If there is no evidence of immunity, females who are pregnant should delay immunization until after pregnancy.  Pneumococcal 13-valent conjugate (PCV13) vaccine.** / Consult your health care provider.  Pneumococcal polysaccharide (PPSV23) vaccine.** / 1 to 2 doses if you smoke cigarettes or if you have certain conditions.  Meningococcal vaccine.** / Consult your health care provider.  Hepatitis A vaccine.** / Consult your health care provider.  Hepatitis B vaccine.** / Consult your health care provider.  Haemophilus influenzae type b (Hib) vaccine.** / Consult your health care provider. Ages 65   years and over  Blood pressure check.** / Every 1 to 2 years.  Lipid and cholesterol check.** / Every 5 years beginning at age 22 years.  Lung cancer screening. / Every year if you are aged 73-80 years and have a 30-pack-year history of smoking and currently smoke or have quit within the past 15 years. Yearly screening is stopped once you have quit smoking for at least 15 years or develop a health problem that would prevent you from having lung cancer  treatment.  Clinical breast exam.** / Every year after age 4 years.  BRCA-related cancer risk assessment.** / For women who have family members with a BRCA-related cancer (breast, ovarian, tubal, or peritoneal cancers).  Mammogram.** / Every year beginning at age 40 years and continuing for as long as you are in good health. Consult with your health care provider.  Pap test.** / Every 3 years starting at age 9 years through age 34 or 91 years with 3 consecutive normal Pap tests. Testing can be stopped between 65 and 70 years with 3 consecutive normal Pap tests and no abnormal Pap or HPV tests in the past 10 years.  HPV screening.** / Every 3 years from ages 57 years through ages 64 or 45 years with a history of 3 consecutive normal Pap tests. Testing can be stopped between 65 and 70 years with 3 consecutive normal Pap tests and no abnormal Pap or HPV tests in the past 10 years.  Fecal occult blood test (FOBT) of stool. / Every year beginning at age 15 years and continuing until age 17 years. You may not need to do this test if you get a colonoscopy every 10 years.  Flexible sigmoidoscopy or colonoscopy.** / Every 5 years for a flexible sigmoidoscopy or every 10 years for a colonoscopy beginning at age 86 years and continuing until age 71 years.  Hepatitis C blood test.** / For all people born from [birth year] through 1964 and any individual with known risks for hepatitis C.  Osteoporosis screening.** / A one-time screening for women ages 83 years and over and women at risk for fractures or osteoporosis.  Skin self-exam. / Monthly.  Influenza vaccine. / Every year.  Tetanus, diphtheria, and acellular pertussis (Tdap/Td) vaccine.** / 1 dose of Td every 10 years.  Varicella vaccine.** / Consult your health care provider.  Zoster vaccine.** / 1 dose for adults aged 61 years or older.  Pneumococcal 13-valent conjugate (PCV13) vaccine.** / Consult your health care provider.  Pneumococcal  polysaccharide (PPSV23) vaccine.** / 1 dose for all adults aged 28 years and older.  Meningococcal vaccine.** / Consult your health care provider.  Hepatitis A vaccine.** / Consult your health care provider.  Hepatitis B vaccine.** / Consult your health care provider.  Haemophilus influenzae type b (Hib) vaccine.** / Consult your health care provider. ** Family history and personal history of risk and conditions may change your health care provider's recommendations. Document Released: 10/02/2001 Document Revised: 12/21/2013 Document Reviewed: 01/01/2011 [facility] Patient Information 2015 [place], [place]. This information is not intended to replace advice given to you by your health care provider. Make sure you discuss any questions you have with your health care provider.

## 2014-11-18 ENCOUNTER — Encounter: Payer: Self-pay | Admitting: Family Medicine

## 2014-11-18 DIAGNOSIS — Z0000 Encounter for general adult medical examination without abnormal findings: Secondary | ICD-10-CM

## 2014-11-18 HISTORY — DX: Encounter for general adult medical examination without abnormal findings: Z00.00

## 2014-11-18 NOTE — Progress Notes (Signed)
Sandy Love  [id number] 01/11/1981 11/18/2014      Progress Note-Follow Up  Subjective  Chief Complaint  Chief Complaint  Patient presents with  . Establish Care    HPI  Patient is a 34 y.o. female in today for routine medical care. Patient is in today to establish care. Is in her usual state of health. 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 She feels well. No complaints of abdominal pain, fevers, malaise or other concerns. Denies CP/palp/SOB/HA/congestion/fevers/GI or GU c/o. Taking meds as prescribed  Past Medical History  Diagnosis Date  . Obesity 09/20/2014  . Preventative health care 11/18/2014  . History of miscarriage 09/20/2014    X 3      Past Surgical History  Procedure Laterality Date  . Dilation and curettage of uterus      Family History  Problem Relation Age of Onset  . Diabetes Maternal Grandmother   . Diabetes Paternal Grandmother   . Obesity Sister   . Renal Disease Brother     single kidney  . Dementia Paternal Grandfather   . Obesity Brother     History   Social History  . Marital Status: Single    Spouse Name: N/A  . Number of Children: N/A  . Years of Education: N/A   Occupational History  . Not on file.   Social History Main Topics  . Smoking status: Former Smoker    Types: Cigars    Quit date: 07/29/2014  . Smokeless tobacco: Never Used  . Alcohol Use: No  . Drug Use: No  . Sexual Activity: Yes    Birth Control/ Protection: None     Comment: lives with boyfriend, works with [employer], no dietary restrictions   Other Topics Concern  . Not on file   Social History Narrative    Current Outpatient Prescriptions on File Prior to Visit  Medication Sig Dispense Refill  . Prenatal Vit-Fe Fumarate-FA (PRENATAL MULTIVITAMIN) TABS tablet Take 1 tablet by mouth daily at 12 noon.    . Progesterone Micronized (PROGESTERONE PO) Take by mouth.     No current  facility-administered medications on file prior to visit.    No Known Allergies  Review of Systems  Review of Systems  Constitutional: Negative for fever, chills and malaise/fatigue.  HENT: Negative for congestion, hearing loss and nosebleeds.   Eyes: Negative for discharge.  Respiratory: Negative for cough, sputum production, shortness of breath and wheezing.   Cardiovascular: Negative for chest pain, palpitations and leg swelling.  Gastrointestinal: Negative for heartburn, nausea, vomiting, abdominal pain, diarrhea, constipation and blood in stool.  Genitourinary: Negative for dysuria, urgency, frequency and hematuria.  Musculoskeletal: Negative for myalgias, back pain and falls.  Skin: Negative for rash.  Neurological: Negative for dizziness, tremors, sensory change, focal weakness, loss of consciousness, weakness and headaches.  Endo/Heme/Allergies: Negative for polydipsia. Does not bruise/bleed easily.  Psychiatric/Behavioral: Negative for depression and suicidal ideas. The patient is nervous/anxious. The patient does not have insomnia.     Objective  BP 122/82 mmHg  Pulse 94  Temp(Src) 98.4 F (36.9 C) (Oral)  Ht 5\' 9"  (1.753 m)  Wt 297 lb 6 oz (134.888 kg)  BMI 43.89 kg/m2  SpO2 99%  LMP 10/28/2014  Physical Exam  Physical Exam  Constitutional: She is oriented to person, place, and time and well-developed, well-nourished, and in no distress. No distress.  HENT:  Head: Normocephalic and atraumatic.  Right Ear: External ear normal.  Left Ear: External ear normal.  Nose: Nose normal.  Mouth/Throat: Oropharynx is clear and moist. No oropharyngeal exudate.  Eyes: Conjunctivae are normal. Pupils are equal, round, and reactive to light. Right eye exhibits no discharge. Left eye exhibits no discharge. No scleral icterus.  Neck: Normal range of motion. Neck supple. No thyromegaly present.  Cardiovascular: Normal rate, regular rhythm, normal heart sounds and intact distal  pulses.   No murmur heard. Pulmonary/Chest: Breath sounds normal. She is in respiratory distress. She has no wheezes. She has no rales.  Abdominal: Soft. Bowel sounds are normal. She exhibits no distension and no mass. There is no tenderness.  Musculoskeletal: Normal range of motion. She exhibits no edema or tenderness.  Lymphadenopathy:    She has no cervical adenopathy.  Neurological: She is alert and oriented to person, place, and time. She has normal reflexes. No cranial nerve deficit. Coordination normal.  Skin: Skin is warm and dry. No rash noted. She is not diaphoretic.  Psychiatric: Mood, memory and affect normal.    No results found for: TSH Lab Results  Component Value Date   WBC 5.5 09/20/2014   HGB 12.0 09/20/2014   HCT 35.7* 09/20/2014   MCV 85.7 09/20/2014   PLT 223.0 09/20/2014   Lab Results  Component Value Date   CREATININE 0.90 09/13/2014   BUN 10 09/13/2014   NA 134* 09/13/2014   K 3.8 09/13/2014   CL 105 09/13/2014   CO2 24 09/13/2014   No results found for: ALT, AST, GGT, ALKPHOS, BILITOT No results found for: CHOL No results found for: HDL No results found for: LDLCALC No results found for: TRIG No results found for: CHOLHDL   Assessment & Plan  History of miscarriage Recently completed, is presently working with gynecology on work up for infertility and recurrent miscarriage   Obesity Encouraged DASH diet, decrease po intake and increase exercise as tolerated. Needs 7-8 hours of sleep nightly. Avoid trans fats, eat small, frequent meals every 4-5 hours with lean proteins, complex carbs and healthy fats. Minimize simple carbs, GMO foods.   Preventative health care Patient encouraged to maintain heart healthy diet, regular exercise, adequate sleep. Consider daily probiotics. Return annually or as needed

## 2014-11-18 NOTE — Assessment & Plan Note (Signed)
Recently completed, is presently working with gynecology on work up for infertility and recurrent miscarriage

## 2014-11-18 NOTE — Assessment & Plan Note (Signed)
Encouraged DASH diet, decrease po intake and increase exercise as tolerated. Needs 7-8 hours of sleep nightly. Avoid trans fats, eat small, frequent meals every 4-5 hours with lean proteins, complex carbs and healthy fats. Minimize simple carbs, GMO foods. 

## 2014-11-18 NOTE — Assessment & Plan Note (Signed)
Patient encouraged to maintain heart healthy diet, regular exercise, adequate sleep. Consider daily probiotics. Return annually or as needed

## 2014-12-16 ENCOUNTER — Telehealth: Payer: Self-pay | Admitting: Family Medicine

## 2014-12-16 NOTE — Telephone Encounter (Signed)
Opened in error

## 2015-01-03 ENCOUNTER — Telehealth: Payer: Self-pay | Admitting: Family Medicine

## 2015-01-03 MED ORDER — NITROFURANTOIN MONOHYD MACRO 100 MG PO CAPS: 100.0000 mg | ORAL_CAPSULE | Freq: Two times a day (BID) | ORAL | Status: DC

## 2015-01-03 NOTE — Telephone Encounter (Signed)
Discussed symptoms with patient -- seem consistent with uncomplicated cystitis.  She will give urine sample ASAP. Could not void at time of discussion here in office.  Will start Macrobid while waiting on sample. Will send for UA and culture.

## 2015-01-03 NOTE — Telephone Encounter (Signed)
This patient called to inform she is urinating more frequently and having a lot of pressure.. Advise if appt/or check urine.

## 2015-01-04 ENCOUNTER — Other Ambulatory Visit (INDEPENDENT_AMBULATORY_CARE_PROVIDER_SITE_OTHER): Payer: 59

## 2015-01-04 DIAGNOSIS — R350 Frequency of micturition: Secondary | ICD-10-CM | POA: Diagnosis not present

## 2015-01-04 LAB — URINALYSIS, ROUTINE W REFLEX MICROSCOPIC
BILIRUBIN URINE: NEGATIVE
KETONES UR: NEGATIVE
Nitrite: POSITIVE — AB
TOTAL PROTEIN, URINE-UPE24: 100 — AB
URINE GLUCOSE: NEGATIVE
Urobilinogen, UA: 0.2 (ref 0.0–1.0)
pH: 6 (ref 5.0–8.0)

## 2015-01-06 ENCOUNTER — Other Ambulatory Visit: Payer: Self-pay | Admitting: Physician Assistant

## 2015-01-06 LAB — URINE CULTURE

## 2015-01-06 MED ORDER — CIPROFLOXACIN HCL 500 MG PO TABS: 500.0000 mg | ORAL_TABLET | Freq: Two times a day (BID) | ORAL | Status: DC

## 2015-02-17 ENCOUNTER — Encounter: Payer: Self-pay | Admitting: Family Medicine

## 2015-02-17 ENCOUNTER — Ambulatory Visit (INDEPENDENT_AMBULATORY_CARE_PROVIDER_SITE_OTHER): Payer: 59 | Admitting: Family Medicine

## 2015-02-17 VITALS — BP 110/76 | HR 94 | Temp 98.2°F | Ht 67.5 in | Wt 284.4 lb

## 2015-02-17 DIAGNOSIS — E669 Obesity, unspecified: Secondary | ICD-10-CM

## 2015-02-17 DIAGNOSIS — R609 Edema, unspecified: Secondary | ICD-10-CM

## 2015-02-17 DIAGNOSIS — R600 Localized edema: Secondary | ICD-10-CM | POA: Diagnosis not present

## 2015-02-17 MED ORDER — FUROSEMIDE 20 MG PO TABS: 40.0000 mg | ORAL_TABLET | Freq: Every day | ORAL | Status: DC | PRN

## 2015-02-17 NOTE — Patient Instructions (Signed)
Lasix 20 mg tabs 2 tabs by mouth daily x 3 days then as needed afternoon   Edema Edema is an abnormal buildup of fluids in your bodytissues. Edema is somewhatdependent on gravity to pull the fluid to the lowest place in your body. That makes the condition more common in the legs and thighs (lower extremities). Painless swelling of the feet and ankles is common and becomes more likely as you get older. It is also common in looser tissues, like around your eyes.  When the affected area is squeezed, the fluid may move out of that spot and leave a dent for a few moments. This dent is called pitting.  CAUSES  There are many possible causes of edema. Eating too much salt and being on your feet or sitting for a long time can cause edema in your legs and ankles. Hot weather may make edema worse. Common medical causes of edema include:  Heart failure.  Liver disease.  Kidney disease.  Weak blood vessels in your legs.  Cancer.  An injury.  Pregnancy.  Some medications.  Obesity. SYMPTOMS  Edema is usually painless.Your skin may look swollen or shiny.  DIAGNOSIS  Your health care provider may be able to diagnose edema by asking about your medical history and doing a physical exam. You may need to have tests such as X-rays, an electrocardiogram, or blood tests to check for medical conditions that may cause edema.  TREATMENT  Edema treatment depends on the cause. If you have heart, liver, or kidney disease, you need the treatment appropriate for these conditions. General treatment may include:  Elevation of the affected body part above the level of your heart.  Compression of the affected body part. Pressure from elastic bandages or support stockings squeezes the tissues and forces fluid back into the blood vessels. This keeps fluid from entering the tissues.  Restriction of fluid and salt intake.  Use of a water pill (diuretic). These medications are appropriate only for some types of  edema. They pull fluid out of your body and make you urinate more often. This gets rid of fluid and reduces swelling, but diuretics can have side effects. Only use diuretics as directed by your health care provider. HOME CARE INSTRUCTIONS   Keep the affected body part above the level of your heart when you are lying down.   Do not sit still or stand for prolonged periods.   Do not put anything directly under your knees when lying down.  Do not wear constricting clothing or garters on your upper legs.   Exercise your legs to work the fluid back into your blood vessels. This may help the swelling go down.   Wear elastic bandages or support stockings to reduce ankle swelling as directed by your health care provider.   Eat a low-salt diet to reduce fluid if your health care provider recommends it.   Only take medicines as directed by your health care provider. SEEK MEDICAL CARE IF:   Your edema is not responding to treatment.  You have heart, liver, or kidney disease and notice symptoms of edema.  You have edema in your legs that does not improve after elevating them.   You have sudden and unexplained weight gain. SEEK IMMEDIATE MEDICAL CARE IF:   You develop shortness of breath or chest pain.   You cannot breathe when you lie down.  You develop pain, redness, or warmth in the swollen areas.   You have heart, liver, or kidney disease  and suddenly get edema.  You have a fever and your symptoms suddenly get worse. MAKE SURE YOU:   Understand these instructions.  Will watch your condition.  Will get help right away if you are not doing well or get worse. Document Released: 08/06/2005 Document Revised: 12/21/2013 Document Reviewed: 05/29/2013 [facility] Patient Information 2015 [place], [place]. This information is not intended to replace advice given to you by your health care provider. Make sure you discuss any questions you have with your health care provider.

## 2015-02-17 NOTE — Progress Notes (Signed)
Pre visit review using our clinic review tool, if applicable. No additional management support is needed unless otherwise documented below in the visit note. 

## 2015-02-27 ENCOUNTER — Encounter: Payer: Self-pay | Admitting: Family Medicine

## 2015-02-27 DIAGNOSIS — R600 Localized edema: Secondary | ICD-10-CM

## 2015-02-27 HISTORY — DX: Localized edema: R60.0

## 2015-02-27 NOTE — Assessment & Plan Note (Signed)
Lasix 20 mg tabs 2 tabs by mouth daily x 3 days then as needed afternoon. Encouraged weight loss, DASH diet, elevate feet and compression hose as needed

## 2015-02-27 NOTE — Progress Notes (Signed)
Sandy Love  [id number] 04/25/1981 02/27/2015      Progress Note-Follow Up  Subjective  Chief Complaint  Chief Complaint  Patient presents with  . Leg Swelling    HPI  Patient is a 34 y.o. female in today for routine medical care. Patient is in today to discuss peel edema. Is been worsening over this past week. She denies any significant change in diet, any recent illness or trauma. The swelling is bilateral. She denies chest pain or shortness of breath associated. She acknowledges minimal exercise. Denies HA/congestion/fevers/GI or GU c/o. Taking meds as prescribed  Past Medical History  Diagnosis Date  . Obesity 09/20/2014  . Preventative health care 11/18/2014  . History of miscarriage 09/20/2014    X 3    . Pedal edema 02/27/2015    Past Surgical History  Procedure Laterality Date  . Dilation and curettage of uterus      Family History  Problem Relation Age of Onset  . Diabetes Maternal Grandmother   . Diabetes Paternal Grandmother   . Obesity Sister   . Renal Disease Brother     single kidney  . Dementia Paternal Grandfather   . Obesity Brother   . Cancer Maternal Aunt [id number]    breast    History   Social History  . Marital Status: Single    Spouse Name: N/A  . Number of Children: N/A  . Years of Education: N/A   Occupational History  . Not on file.   Social History Main Topics  . Smoking status: Former Smoker    Types: Cigars    Quit date: 07/29/2014  . Smokeless tobacco: Never Used  . Alcohol Use: No  . Drug Use: No  . Sexual Activity: Yes    Birth Control/ Protection: None     Comment: lives with boyfriend, works with [employer], no dietary restrictions   Other Topics Concern  . Not on file   Social History Narrative    No current outpatient prescriptions on file prior to visit.   No current facility-administered medications on file prior to visit.    No Known Allergies  Review of Systems  Review of Systems  Constitutional: Negative for  fever and malaise/fatigue.  HENT: Negative for congestion.   Eyes: Negative for discharge.  Respiratory: Negative for shortness of breath.   Cardiovascular: Positive for leg swelling. Negative for chest pain and palpitations.  Gastrointestinal: Negative for nausea, abdominal pain and diarrhea.  Genitourinary: Negative for dysuria.  Musculoskeletal: Negative for falls.  Skin: Negative for rash.  Neurological: Negative for loss of consciousness and headaches.  Endo/Heme/Allergies: Negative for polydipsia.  Psychiatric/Behavioral: Negative for depression and suicidal ideas. The patient is not nervous/anxious and does not have insomnia.     Objective  BP 110/76 mmHg  Pulse 94  Temp(Src) 98.2 F (36.8 C) (Oral)  Ht 5' 7.5" (1.715 m)  Wt 284 lb 6 oz (128.992 kg)  BMI 43.86 kg/m2  SpO2 98%  LMP 01/25/2015  Physical Exam  Physical Exam  Constitutional: She is oriented to person, place, and time and well-developed, well-nourished, and in no distress. No distress.  HENT:  Head: Normocephalic and atraumatic.  Eyes: Conjunctivae are normal.  Neck: Neck supple. No thyromegaly present.  Cardiovascular: Normal rate, regular rhythm and normal heart sounds.   No murmur heard. Pulmonary/Chest: Effort normal and breath sounds normal. She has no wheezes.  Abdominal: Soft. Bowel sounds are normal. She exhibits no distension and no mass.  Musculoskeletal: She exhibits edema.  Lymphadenopathy:    She has no cervical adenopathy.  Neurological: She is alert and oriented to person, place, and time.  Skin: Skin is warm and dry. No rash noted. She is not diaphoretic.  Psychiatric: Memory, affect and judgment normal.    No results found for: TSH Lab Results  Component Value Date   WBC 5.5 09/20/2014   HGB 12.0 09/20/2014   HCT 35.7* 09/20/2014   MCV 85.7 09/20/2014   PLT 223.0 09/20/2014   Lab Results  Component Value Date   CREATININE 0.90 09/13/2014   BUN 10 09/13/2014   NA 134*  09/13/2014   K 3.8 09/13/2014   CL 105 09/13/2014   CO2 24 09/13/2014   No results found for: ALT, AST, GGT, ALKPHOS, BILITOT No results found for: CHOL No results found for: HDL No results found for: LDLCALC No results found for: TRIG No results found for: CHOLHDL   Assessment & Plan  Obesity Encouraged DASH diet, decrease po intake and increase exercise as tolerated. Needs 7-8 hours of sleep nightly. Avoid trans fats, eat small, frequent meals every 4-5 hours with lean proteins, complex carbs and healthy fats. Minimize simple carbs, GMO foods.  Pedal edema Lasix 20 mg tabs 2 tabs by mouth daily x 3 days then as needed afternoon. Encouraged weight loss, DASH diet, elevate feet and compression hose as needed

## 2015-02-27 NOTE — Assessment & Plan Note (Signed)
Encouraged DASH diet, decrease po intake and increase exercise as tolerated. Needs 7-8 hours of sleep nightly. Avoid trans fats, eat small, frequent meals every 4-5 hours with lean proteins, complex carbs and healthy fats. Minimize simple carbs, GMO foods. 

## 2015-04-26 ENCOUNTER — Ambulatory Visit: Payer: 59 | Admitting: Family Medicine

## 2015-07-28 ENCOUNTER — Ambulatory Visit: Payer: 59

## 2015-07-28 ENCOUNTER — Telehealth: Payer: Self-pay | Admitting: Family Medicine

## 2015-07-28 DIAGNOSIS — Z3200 Encounter for pregnancy test, result unknown: Secondary | ICD-10-CM

## 2015-07-28 LAB — POCT URINE PREGNANCY: Preg Test, Ur: NEGATIVE

## 2015-07-28 NOTE — Telephone Encounter (Signed)
RN informed and did schedule nurse visit.

## 2015-07-28 NOTE — Telephone Encounter (Signed)
OK to have her perform requested test with RN visit

## 2015-07-28 NOTE — Progress Notes (Signed)
RN visit note reviewed. Agree with documention and plan.

## 2015-07-28 NOTE — Progress Notes (Unsigned)
Pre visit review using our clinic review tool, if applicable. No additional management support is needed unless otherwise documented below in the visit note. 

## 2015-07-28 NOTE — Telephone Encounter (Signed)
Patient states she is a couple weeks late on hour menstural cycle.  She would like to schedule a nurse visit to have pregnancy urine test done Bridgett Larsson(Ashlee Garlon HatchetLangston has stated she can do it), she does not want blood drawn.  Ext. [phone]

## 2015-07-31 NOTE — Progress Notes (Unsigned)
Patient not seen by MD. Patient noting missed menses. Pregnancy test ordered and was negative

## 2015-09-30 ENCOUNTER — Telehealth: Payer: Self-pay | Admitting: Family Medicine

## 2015-09-30 NOTE — Telephone Encounter (Signed)
Good Morning Dr. Abner Greenspan,   Would you be able to prescribe me progesterone 200 mg? When I had my m/c in [place] my gyn advised that the next time I got pregnant she would want me to try this and then when I did get pregnant Dr. Beatrix Shipper prescribed progesterone  but before I could try I had another m/c. If you would like me to schedule an appointment i will do so please advise

## 2015-09-30 NOTE — Telephone Encounter (Signed)
Good afternoon Not something I generally do and I would like to read the latest data so give me the weekend to read up then I will let you know.

## 2015-10-02 NOTE — Telephone Encounter (Signed)
Did some reading over the weekend and it appears there are numerous options and not always which option is best for whom so I think she should see a gynecologist to discuss options. Could refer across the hall or elsewhere if she is interested

## 2015-10-03 NOTE — Telephone Encounter (Signed)
Spoke to the patient this morning.  She has already her own OBGYN and she can schedule an appointment with her.  She states she prescribed before.  She did want to thank PCP for looking into for her.

## 2015-10-13 DIAGNOSIS — H35413 Lattice degeneration of retina, bilateral: Secondary | ICD-10-CM | POA: Diagnosis not present

## 2015-10-13 DIAGNOSIS — H5201 Hypermetropia, right eye: Secondary | ICD-10-CM | POA: Diagnosis not present

## 2015-10-13 DIAGNOSIS — H52221 Regular astigmatism, right eye: Secondary | ICD-10-CM | POA: Diagnosis not present

## 2015-10-13 DIAGNOSIS — H5211 Myopia, right eye: Secondary | ICD-10-CM | POA: Diagnosis not present

## 2015-10-13 DIAGNOSIS — H33322 Round hole, left eye: Secondary | ICD-10-CM | POA: Diagnosis not present

## 2015-11-15 ENCOUNTER — Telehealth: Payer: Self-pay | Admitting: Family Medicine

## 2015-11-15 ENCOUNTER — Other Ambulatory Visit: Payer: 59

## 2015-11-15 DIAGNOSIS — Z0000 Encounter for general adult medical examination without abnormal findings: Secondary | ICD-10-CM

## 2015-11-15 NOTE — Telephone Encounter (Signed)
I am happy to do her labs but since she does have a significant PMH I recommend she does the labs on the morning of the visit as a preventative visit. Lipid, cmp, cbc, tsh

## 2015-11-15 NOTE — Telephone Encounter (Signed)
Labs entered, patient informed.

## 2015-11-15 NOTE — Telephone Encounter (Signed)
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 Let me know (I will order for her) and I can let Michaelyn know and have done then.

## 2015-11-16 ENCOUNTER — Telehealth: Payer: Self-pay | Admitting: *Deleted

## 2015-11-16 ENCOUNTER — Encounter: Payer: Self-pay | Admitting: *Deleted

## 2015-11-16 NOTE — Telephone Encounter (Signed)
Pre-Visit Call completed with patient and chart updated.   Pre-Visit Info documented in Specialty Comments under [employer].    

## 2015-11-17 ENCOUNTER — Ambulatory Visit (INDEPENDENT_AMBULATORY_CARE_PROVIDER_SITE_OTHER): Payer: 59 | Admitting: Family Medicine

## 2015-11-17 ENCOUNTER — Encounter: Payer: Self-pay | Admitting: Family Medicine

## 2015-11-17 VITALS — BP 120/84 | HR 92 | Temp 98.6°F | Ht 69.5 in | Wt 279.2 lb

## 2015-11-17 DIAGNOSIS — Z0000 Encounter for general adult medical examination without abnormal findings: Secondary | ICD-10-CM | POA: Diagnosis not present

## 2015-11-17 NOTE — Patient Instructions (Signed)
Preventive Care for Adults, Female A healthy lifestyle and preventive care can promote health and wellness. Preventive health guidelines for women include the following key practices.  A routine yearly physical is a good way to check with your health care provider about your health and preventive screening. It is a chance to share any concerns and updates on your health and to receive a thorough exam.  Visit your dentist for a routine exam and preventive care every 6 months. Brush your teeth twice a day and floss once a day. Good oral hygiene prevents tooth decay and gum disease.  The frequency of eye exams is based on your age, health, family medical history, use of contact lenses, and other factors. Follow your health care provider's recommendations for frequency of eye exams.  Eat a healthy diet. Foods like vegetables, fruits, whole grains, low-fat dairy products, and lean protein foods contain the nutrients you need without too many calories. Decrease your intake of foods high in solid fats, added sugars, and salt. Eat the right amount of calories for you.Get information about a proper diet from your health care provider, if necessary.  Regular physical exercise is one of the most important things you can do for your health. Most adults should get at least 150 minutes of moderate-intensity exercise (any activity that increases your heart rate and causes you to sweat) each week. In addition, most adults need muscle-strengthening exercises on 2 or more days a week.  Maintain a healthy weight. The body mass index (BMI) is a screening tool to identify possible weight problems. It provides an estimate of body fat based on height and weight. Your health care provider can find your BMI and can help you achieve or maintain a healthy weight.For adults 20 years and older:  A BMI below 18.5 is considered underweight.  A BMI of 18.5 to 24.9 is normal.  A BMI of 25 to 29.9 is considered overweight.  A  BMI of 30 and above is considered obese.  Maintain normal blood lipids and cholesterol levels by exercising and minimizing your intake of saturated fat. Eat a balanced diet with plenty of fruit and vegetables. Blood tests for lipids and cholesterol should begin at age 45 and be repeated every 5 years. If your lipid or cholesterol levels are high, you are over 50, or you are at high risk for heart disease, you may need your cholesterol levels checked more frequently.Ongoing high lipid and cholesterol levels should be treated with medicines if diet and exercise are not working.  If you smoke, find out from your health care provider how to quit. If you do not use tobacco, do not start.  Lung cancer screening is recommended for adults aged 45-80 years who are at high risk for developing lung cancer because of a history of smoking. A yearly low-dose CT scan of the lungs is recommended for people who have at least a 30-pack-year history of smoking and are a current smoker or have quit within the past 15 years. A pack year of smoking is smoking an average of 1 pack of cigarettes a day for 1 year (for example: 1 pack a day for 30 years or 2 packs a day for 15 years). Yearly screening should continue until the smoker has stopped smoking for at least 15 years. Yearly screening should be stopped for people who develop a health problem that would prevent them from having lung cancer treatment.  If you are pregnant, do not drink alcohol. If you are  breastfeeding, be very cautious about drinking alcohol. If you are not pregnant and choose to drink alcohol, do not have more than 1 drink per day. One drink is considered to be 12 ounces (355 mL) of beer, 5 ounces (148 mL) of wine, or 1.5 ounces (44 mL) of liquor.  Avoid use of street drugs. Do not share needles with anyone. Ask for help if you need support or instructions about stopping the use of drugs.  High blood pressure causes heart disease and increases the risk  of stroke. Your blood pressure should be checked at least every 1 to 2 years. Ongoing high blood pressure should be treated with medicines if weight loss and exercise do not work.  If you are 55-79 years old, ask your health care provider if you should take aspirin to prevent strokes.  Diabetes screening is done by taking a blood sample to check your blood glucose level after you have not eaten for a certain period of time (fasting). If you are not overweight and you do not have risk factors for diabetes, you should be screened once every 3 years starting at age 45. If you are overweight or obese and you are 40-70 years of age, you should be screened for diabetes every year as part of your cardiovascular risk assessment.  Breast cancer screening is essential preventive care for women. You should practice "breast self-awareness." This means understanding the normal appearance and feel of your breasts and may include breast self-examination. Any changes detected, no matter how small, should be reported to a health care provider. Women in their 20s and 30s should have a clinical breast exam (CBE) by a health care provider as part of a regular health exam every 1 to 3 years. After age 40, women should have a CBE every year. Starting at age 40, women should consider having a mammogram (breast X-ray test) every year. Women who have a family history of breast cancer should talk to their health care provider about genetic screening. Women at a high risk of breast cancer should talk to their health care providers about having an MRI and a mammogram every year.  Breast cancer gene (BRCA)-related cancer risk assessment is recommended for women who have family members with BRCA-related cancers. BRCA-related cancers include breast, ovarian, tubal, and peritoneal cancers. Having family members with these cancers may be associated with an increased risk for harmful changes (mutations) in the breast cancer genes BRCA1 and  BRCA2. Results of the assessment will determine the need for genetic counseling and BRCA1 and BRCA2 testing.  Your health care provider may recommend that you be screened regularly for cancer of the pelvic organs (ovaries, uterus, and vagina). This screening involves a pelvic examination, including checking for microscopic changes to the surface of your cervix (Pap test). You may be encouraged to have this screening done every 3 years, beginning at age 21.  For women ages 30-65, health care providers may recommend pelvic exams and Pap testing every 3 years, or they may recommend the Pap and pelvic exam, combined with testing for human papilloma virus (HPV), every 5 years. Some types of HPV increase your risk of cervical cancer. Testing for HPV may also be done on women of any age with unclear Pap test results.  Other health care providers may not recommend any screening for nonpregnant women who are considered low risk for pelvic cancer and who do not have symptoms. Ask your health care provider if a screening pelvic exam is right for   you.  If you have had past treatment for cervical cancer or a condition that could lead to cancer, you need Pap tests and screening for cancer for at least 20 years after your treatment. If Pap tests have been discontinued, your risk factors (such as having a new sexual partner) need to be reassessed to determine if screening should resume. Some women have medical problems that increase the chance of getting cervical cancer. In these cases, your health care provider may recommend more frequent screening and Pap tests.  Colorectal cancer can be detected and often prevented. Most routine colorectal cancer screening begins at the age of 50 years and continues through age 75 years. However, your health care provider may recommend screening at an earlier age if you have risk factors for colon cancer. On a yearly basis, your health care provider may provide home test kits to check  for hidden blood in the stool. Use of a small camera at the end of a tube, to directly examine the colon (sigmoidoscopy or colonoscopy), can detect the earliest forms of colorectal cancer. Talk to your health care provider about this at age 50, when routine screening begins. Direct exam of the colon should be repeated every 5-10 years through age 75 years, unless early forms of precancerous polyps or small growths are found.  People who are at an increased risk for hepatitis B should be screened for this virus. You are considered at high risk for hepatitis B if:  You were born in a country where hepatitis B occurs often. Talk with your health care provider about which countries are considered high risk.  Your parents were born in a high-risk country and you have not received a shot to protect against hepatitis B (hepatitis B vaccine).  You have HIV or AIDS.  You use needles to inject street drugs.  You live with, or have sex with, someone who has hepatitis B.  You get hemodialysis treatment.  You take certain medicines for conditions like cancer, organ transplantation, and autoimmune conditions.  Hepatitis C blood testing is recommended for all people born from 1945 through 1965 and any individual with known risks for hepatitis C.  Practice safe sex. Use condoms and avoid high-risk sexual practices to reduce the spread of sexually transmitted infections (STIs). STIs include gonorrhea, chlamydia, syphilis, trichomonas, herpes, HPV, and human immunodeficiency virus (HIV). Herpes, HIV, and HPV are viral illnesses that have no cure. They can result in disability, cancer, and death.  You should be screened for sexually transmitted illnesses (STIs) including gonorrhea and chlamydia if:  You are sexually active and are younger than 24 years.  You are older than 24 years and your health care provider tells you that you are at risk for this type of infection.  Your sexual activity has changed  since you were last screened and you are at an increased risk for chlamydia or gonorrhea. Ask your health care provider if you are at risk.  If you are at risk of being infected with HIV, it is recommended that you take a prescription medicine daily to prevent HIV infection. This is called preexposure prophylaxis (PrEP). You are considered at risk if:  You are sexually active and do not regularly use condoms or know the HIV status of your partner(s).  You take drugs by injection.  You are sexually active with a partner who has HIV.  Talk with your health care provider about whether you are at high risk of being infected with HIV. If   you choose to begin PrEP, you should first be tested for HIV. You should then be tested every 3 months for as long as you are taking PrEP.  Osteoporosis is a disease in which the bones lose minerals and strength with aging. This can result in serious bone fractures or breaks. The risk of osteoporosis can be identified using a bone density scan. Women ages 67 years and over and women at risk for fractures or osteoporosis should discuss screening with their health care providers. Ask your health care provider whether you should take a calcium supplement or vitamin D to reduce the rate of osteoporosis.  Menopause can be associated with physical symptoms and risks. Hormone replacement therapy is available to decrease symptoms and risks. You should talk to your health care provider about whether hormone replacement therapy is right for you.  Use sunscreen. Apply sunscreen liberally and repeatedly throughout the day. You should seek shade when your shadow is shorter than you. Protect yourself by wearing long sleeves, pants, a wide-brimmed hat, and sunglasses year round, whenever you are outdoors.  Once a month, do a whole body skin exam, using a mirror to look at the skin on your back. Tell your health care provider of new moles, moles that have irregular borders, moles that  are larger than a pencil eraser, or moles that have changed in shape or color.  Stay current with required vaccines (immunizations).  Influenza vaccine. All adults should be immunized every year.  Tetanus, diphtheria, and acellular pertussis (Td, Tdap) vaccine. Pregnant women should receive 1 dose of Tdap vaccine during each pregnancy. The dose should be obtained regardless of the length of time since the last dose. Immunization is preferred during the 27th-36th week of gestation. An adult who has not previously received Tdap or who does not know her vaccine status should receive 1 dose of Tdap. This initial dose should be followed by tetanus and diphtheria toxoids (Td) booster doses every 10 years. Adults with an unknown or incomplete history of completing a 3-dose immunization series with Td-containing vaccines should begin or complete a primary immunization series including a Tdap dose. Adults should receive a Td booster every 10 years.  Varicella vaccine. An adult without evidence of immunity to varicella should receive 2 doses or a second dose if she has previously received 1 dose. Pregnant females who do not have evidence of immunity should receive the first dose after pregnancy. This first dose should be obtained before leaving the health care facility. The second dose should be obtained 4-8 weeks after the first dose.  Human papillomavirus (HPV) vaccine. Females aged 13-26 years who have not received the vaccine previously should obtain the 3-dose series. The vaccine is not recommended for use in pregnant females. However, pregnancy testing is not needed before receiving a dose. If a female is found to be pregnant after receiving a dose, no treatment is needed. In that case, the remaining doses should be delayed until after the pregnancy. Immunization is recommended for any person with an immunocompromised condition through the age of 61 years if she did not get any or all doses earlier. During the  3-dose series, the second dose should be obtained 4-8 weeks after the first dose. The third dose should be obtained 24 weeks after the first dose and 16 weeks after the second dose.  Zoster vaccine. One dose is recommended for adults aged 30 years or older unless certain conditions are present.  Measles, mumps, and rubella (MMR) vaccine. Adults born  before 1957 generally are considered immune to measles and mumps. Adults born in 1957 or later should have 1 or more doses of MMR vaccine unless there is a contraindication to the vaccine or there is laboratory evidence of immunity to each of the three diseases. A routine second dose of MMR vaccine should be obtained at least 28 days after the first dose for students attending postsecondary schools, health care workers, or international travelers. People who received inactivated measles vaccine or an unknown type of measles vaccine during 1963-1967 should receive 2 doses of MMR vaccine. People who received inactivated mumps vaccine or an unknown type of mumps vaccine before 1979 and are at high risk for mumps infection should consider immunization with 2 doses of MMR vaccine. For females of childbearing age, rubella immunity should be determined. If there is no evidence of immunity, females who are not pregnant should be vaccinated. If there is no evidence of immunity, females who are pregnant should delay immunization until after pregnancy. Unvaccinated health care workers born before 1957 who lack laboratory evidence of measles, mumps, or rubella immunity or laboratory confirmation of disease should consider measles and mumps immunization with 2 doses of MMR vaccine or rubella immunization with 1 dose of MMR vaccine.  Pneumococcal 13-valent conjugate (PCV13) vaccine. When indicated, a person who is uncertain of his immunization history and has no record of immunization should receive the PCV13 vaccine. All adults 65 years of age and older should receive this  vaccine. An adult aged 19 years or older who has certain medical conditions and has not been previously immunized should receive 1 dose of PCV13 vaccine. This PCV13 should be followed with a dose of pneumococcal polysaccharide (PPSV23) vaccine. Adults who are at high risk for pneumococcal disease should obtain the PPSV23 vaccine at least 8 weeks after the dose of PCV13 vaccine. Adults older than 34 years of age who have normal immune system function should obtain the PPSV23 vaccine dose at least 1 year after the dose of PCV13 vaccine.  Pneumococcal polysaccharide (PPSV23) vaccine. When PCV13 is also indicated, PCV13 should be obtained first. All adults aged 65 years and older should be immunized. An adult younger than age 65 years who has certain medical conditions should be immunized. Any person who resides in a nursing home or long-term care facility should be immunized. An adult smoker should be immunized. People with an immunocompromised condition and certain other conditions should receive both PCV13 and PPSV23 vaccines. People with human immunodeficiency virus (HIV) infection should be immunized as soon as possible after diagnosis. Immunization during chemotherapy or radiation therapy should be avoided. Routine use of PPSV23 vaccine is not recommended for American Indians, [place] Natives, or people younger than 65 years unless there are medical conditions that require PPSV23 vaccine. When indicated, people who have unknown immunization and have no record of immunization should receive PPSV23 vaccine. One-time revaccination 5 years after the first dose of PPSV23 is recommended for people aged 19-64 years who have chronic kidney failure, nephrotic syndrome, asplenia, or immunocompromised conditions. People who received 1-2 doses of PPSV23 before age 65 years should receive another dose of PPSV23 vaccine at age 65 years or later if at least 5 years have passed since the previous dose. Doses of PPSV23 are not  needed for people immunized with PPSV23 at or after age 65 years.  Meningococcal vaccine. Adults with asplenia or persistent complement component deficiencies should receive 2 doses of quadrivalent meningococcal conjugate (MenACWY-D) vaccine. The doses should be obtained   at least 2 months apart. Microbiologists working with certain meningococcal bacteria, [place] recruits, people at risk during an outbreak, and people who travel to or live in countries with a high rate of meningitis should be immunized. A first-year college student up through age 34 years who is living in a residence hall should receive a dose if she did not receive a dose on or after her 16th birthday. Adults who have certain high-risk conditions should receive one or more doses of vaccine.  Hepatitis A vaccine. Adults who wish to be protected from this disease, have certain high-risk conditions, work with hepatitis A-infected animals, work in hepatitis A research labs, or travel to or work in countries with a high rate of hepatitis A should be immunized. Adults who were previously unvaccinated and who anticipate close contact with an international adoptee during the first 60 days after arrival in the [place] from a country with a high rate of hepatitis A should be immunized.  Hepatitis B vaccine. Adults who wish to be protected from this disease, have certain high-risk conditions, may be exposed to blood or other infectious body fluids, are household contacts or sex partners of hepatitis B positive people, are clients or workers in certain care facilities, or travel to or work in countries with a high rate of hepatitis B should be immunized.  Haemophilus influenzae type b (Hib) vaccine. A previously unvaccinated person with asplenia or sickle cell disease or having a scheduled splenectomy should receive 1 dose of Hib vaccine. Regardless of previous immunization, a recipient of a hematopoietic stem cell transplant should receive a  3-dose series 6-12 months after her successful transplant. Hib vaccine is not recommended for adults with HIV infection. Preventive Services / Frequency Ages 35 to 4 years  Blood pressure check.** / Every 3-5 years.  Lipid and cholesterol check.** / Every 5 years beginning at age 60.  Clinical breast exam.** / Every 3 years for women in their 71s and 10s.  BRCA-related cancer risk assessment.** / For women who have family members with a BRCA-related cancer (breast, ovarian, tubal, or peritoneal cancers).  Pap test.** / Every 2 years from ages 76 through 26. Every 3 years starting at age 61 through age 76 or 93 with a history of 3 consecutive normal Pap tests.  HPV screening.** / Every 3 years from ages 37 through ages 60 to 51 with a history of 3 consecutive normal Pap tests.  Hepatitis C blood test.** / For any individual with known risks for hepatitis C.  Skin self-exam. / Monthly.  Influenza vaccine. / Every year.  Tetanus, diphtheria, and acellular pertussis (Tdap, Td) vaccine.** / Consult your health care provider. Pregnant women should receive 1 dose of Tdap vaccine during each pregnancy. 1 dose of Td every 10 years.  Varicella vaccine.** / Consult your health care provider. Pregnant females who do not have evidence of immunity should receive the first dose after pregnancy.  HPV vaccine. / 3 doses over 6 months, if 93 and younger. The vaccine is not recommended for use in pregnant females. However, pregnancy testing is not needed before receiving a dose.  Measles, mumps, rubella (MMR) vaccine.** / You need at least 1 dose of MMR if you were born in 1957 or later. You may also need a 2nd dose. For females of childbearing age, rubella immunity should be determined. If there is no evidence of immunity, females who are not pregnant should be vaccinated. If there is no evidence of immunity, females who are  pregnant should delay immunization until after pregnancy.  Pneumococcal  13-valent conjugate (PCV13) vaccine.** / Consult your health care provider.  Pneumococcal polysaccharide (PPSV23) vaccine.** / 1 to 2 doses if you smoke cigarettes or if you have certain conditions.  Meningococcal vaccine.** / 1 dose if you are age 68 to 8 years and a Market researcher living in a residence hall, or have one of several medical conditions, you need to get vaccinated against meningococcal disease. You may also need additional booster doses.  Hepatitis A vaccine.** / Consult your health care provider.  Hepatitis B vaccine.** / Consult your health care provider.  Haemophilus influenzae type b (Hib) vaccine.** / Consult your health care provider. Ages 7 to 53 years  Blood pressure check.** / Every year.  Lipid and cholesterol check.** / Every 5 years beginning at age 25 years.  Lung cancer screening. / Every year if you are aged 11-80 years and have a 30-pack-year history of smoking and currently smoke or have quit within the past 15 years. Yearly screening is stopped once you have quit smoking for at least 15 years or develop a health problem that would prevent you from having lung cancer treatment.  Clinical breast exam.** / Every year after age 48 years.  BRCA-related cancer risk assessment.** / For women who have family members with a BRCA-related cancer (breast, ovarian, tubal, or peritoneal cancers).  Mammogram.** / Every year beginning at age 41 years and continuing for as long as you are in good health. Consult with your health care provider.  Pap test.** / Every 3 years starting at age 65 years through age 37 or 70 years with a history of 3 consecutive normal Pap tests.  HPV screening.** / Every 3 years from ages 72 years through ages 60 to 40 years with a history of 3 consecutive normal Pap tests.  Fecal occult blood test (FOBT) of stool. / Every year beginning at age 21 years and continuing until age 5 years. You may not need to do this test if you get  a colonoscopy every 10 years.  Flexible sigmoidoscopy or colonoscopy.** / Every 5 years for a flexible sigmoidoscopy or every 10 years for a colonoscopy beginning at age 35 years and continuing until age 48 years.  Hepatitis C blood test.** / For all people born from [birth year] through 1965 and any individual with known risks for hepatitis C.  Skin self-exam. / Monthly.  Influenza vaccine. / Every year.  Tetanus, diphtheria, and acellular pertussis (Tdap/Td) vaccine.** / Consult your health care provider. Pregnant women should receive 1 dose of Tdap vaccine during each pregnancy. 1 dose of Td every 10 years.  Varicella vaccine.** / Consult your health care provider. Pregnant females who do not have evidence of immunity should receive the first dose after pregnancy.  Zoster vaccine.** / 1 dose for adults aged 30 years or older.  Measles, mumps, rubella (MMR) vaccine.** / You need at least 1 dose of MMR if you were born in 1957 or later. You may also need a second dose. For females of childbearing age, rubella immunity should be determined. If there is no evidence of immunity, females who are not pregnant should be vaccinated. If there is no evidence of immunity, females who are pregnant should delay immunization until after pregnancy.  Pneumococcal 13-valent conjugate (PCV13) vaccine.** / Consult your health care provider.  Pneumococcal polysaccharide (PPSV23) vaccine.** / 1 to 2 doses if you smoke cigarettes or if you have certain conditions.  Meningococcal vaccine.** /  Consult your health care provider.  Hepatitis A vaccine.** / Consult your health care provider.  Hepatitis B vaccine.** / Consult your health care provider.  Haemophilus influenzae type b (Hib) vaccine.** / Consult your health care provider. Ages 64 years and over  Blood pressure check.** / Every year.  Lipid and cholesterol check.** / Every 5 years beginning at age 23 years.  Lung cancer screening. / Every year if you  are aged 16-80 years and have a 30-pack-year history of smoking and currently smoke or have quit within the past 15 years. Yearly screening is stopped once you have quit smoking for at least 15 years or develop a health problem that would prevent you from having lung cancer treatment.  Clinical breast exam.** / Every year after age 74 years.  BRCA-related cancer risk assessment.** / For women who have family members with a BRCA-related cancer (breast, ovarian, tubal, or peritoneal cancers).  Mammogram.** / Every year beginning at age 44 years and continuing for as long as you are in good health. Consult with your health care provider.  Pap test.** / Every 3 years starting at age 58 years through age 22 or 39 years with 3 consecutive normal Pap tests. Testing can be stopped between 65 and 70 years with 3 consecutive normal Pap tests and no abnormal Pap or HPV tests in the past 10 years.  HPV screening.** / Every 3 years from ages 64 years through ages 70 or 61 years with a history of 3 consecutive normal Pap tests. Testing can be stopped between 65 and 70 years with 3 consecutive normal Pap tests and no abnormal Pap or HPV tests in the past 10 years.  Fecal occult blood test (FOBT) of stool. / Every year beginning at age 40 years and continuing until age 27 years. You may not need to do this test if you get a colonoscopy every 10 years.  Flexible sigmoidoscopy or colonoscopy.** / Every 5 years for a flexible sigmoidoscopy or every 10 years for a colonoscopy beginning at age 7 years and continuing until age 32 years.  Hepatitis C blood test.** / For all people born from [birth year] through 1964 and any individual with known risks for hepatitis C.  Osteoporosis screening.** / A one-time screening for women ages 30 years and over and women at risk for fractures or osteoporosis.  Skin self-exam. / Monthly.  Influenza vaccine. / Every year.  Tetanus, diphtheria, and acellular pertussis (Tdap/Td)  vaccine.** / 1 dose of Td every 10 years.  Varicella vaccine.** / Consult your health care provider.  Zoster vaccine.** / 1 dose for adults aged 35 years or older.  Pneumococcal 13-valent conjugate (PCV13) vaccine.** / Consult your health care provider.  Pneumococcal polysaccharide (PPSV23) vaccine.** / 1 dose for all adults aged 46 years and older.  Meningococcal vaccine.** / Consult your health care provider.  Hepatitis A vaccine.** / Consult your health care provider.  Hepatitis B vaccine.** / Consult your health care provider.  Haemophilus influenzae type b (Hib) vaccine.** / Consult your health care provider. ** Family history and personal history of risk and conditions may change your health care provider's recommendations.   This information is not intended to replace advice given to you by your health care provider. Make sure you discuss any questions you have with your health care provider.   Document Released: 10/02/2001 Document Revised: 08/27/2014 Document Reviewed: 01/01/2011 Elsevier Interactive Patient Education [employer].

## 2015-11-17 NOTE — Assessment & Plan Note (Signed)
Patient encouraged to maintain heart healthy diet, regular exercise, adequate sleep. Consider daily probiotics. Take medications as prescribed. Doing well follows with GYN for paps

## 2015-11-17 NOTE — Progress Notes (Signed)
Pre visit review using our clinic review tool, if applicable. No additional management support is needed unless otherwise documented below in the visit note. 

## 2015-11-17 NOTE — Progress Notes (Signed)
Subjective:    Patient ID: Sandy Love, female    DOB: 03/10/1981, 35 y.o.   MRN: [id number]  Chief Complaint  Patient presents with  . Annual Exam    HPI Patient is in today for Annual Physical Exam.  Patient still has been trying to get pregnant, patient reports has been exercising and been trying to loose some weight.  Doing well, is going to make an appt with GYN for follow up soon. No recent illness or acute complaints. Denies CP/palp/SOB/HA/congestion/fevers/GI or GU c/o. Taking meds as prescribed  Past Medical History  Diagnosis Date  . Obesity 09/20/2014  . Preventative health care 11/18/2014  . History of miscarriage 09/20/2014    X 3    . Pedal edema 02/27/2015    Past Surgical History  Procedure Laterality Date  . Dilation and curettage of uterus      Family History  Problem Relation Age of Onset  . Diabetes Maternal Grandmother   . Diabetes Paternal Grandmother   . Obesity Sister   . Renal Disease Brother     single kidney  . Dementia Paternal Grandfather   . Obesity Brother   . Cancer Maternal Aunt [id number]    breast    Social History   Social History  . Marital Status: Single    Spouse Name: N/A  . Number of Children: N/A  . Years of Education: N/A   Occupational History  . Not on file.   Social History Main Topics  . Smoking status: Former Smoker    Types: Cigars    Quit date: 07/29/2014  . Smokeless tobacco: Never Used  . Alcohol Use: No  . Drug Use: No  . Sexual Activity: Yes    Birth Control/ Protection: None     Comment: lives with boyfriend, works with [employer], no dietary restrictions   Other Topics Concern  . Not on file   Social History Narrative    No outpatient prescriptions prior to visit.   No facility-administered medications prior to visit.    No Known Allergies  Review of Systems  Constitutional: Negative for fever and malaise/fatigue.  HENT: Negative for congestion.   Eyes: Negative for blurred vision.  Respiratory:  Negative for shortness of breath.   Cardiovascular: Negative for chest pain, palpitations and leg swelling.  Gastrointestinal: Negative for nausea, abdominal pain and blood in stool.  Genitourinary: Negative for dysuria and frequency.  Musculoskeletal: Negative for falls.  Skin: Negative for rash.  Neurological: Negative for dizziness, loss of consciousness and headaches.  Endo/Heme/Allergies: Negative for environmental allergies.  Psychiatric/Behavioral: Negative for depression. The patient is not nervous/anxious.        Objective:    Physical Exam  Constitutional: She is oriented to person, place, and time. She appears well-developed and well-nourished. No distress.  HENT:  Head: Normocephalic and atraumatic.  Eyes: Conjunctivae are normal.  Neck: Neck supple. No thyromegaly present.  Cardiovascular: Normal rate, regular rhythm and normal heart sounds.   No murmur heard. Pulmonary/Chest: Effort normal and breath sounds normal. No respiratory distress.  Abdominal: Soft. Bowel sounds are normal. She exhibits no distension and no mass. There is no tenderness.  Musculoskeletal: She exhibits no edema.  Lymphadenopathy:    She has no cervical adenopathy.  Neurological: She is alert and oriented to person, place, and time.  Skin: Skin is warm and dry.  Psychiatric: She has a normal mood and affect. Her behavior is normal.    BP 120/84 mmHg  Pulse 92  Temp(Src) 98.6 F (37 C) (Oral)  Ht 5' 9.5" (1.765 m)  Wt 279 lb 4 oz (126.667 kg)  BMI 40.66 kg/m2  SpO2 96% Wt Readings from Last 3 Encounters:  11/17/15 279 lb 4 oz (126.667 kg)  02/17/15 284 lb 6 oz (128.992 kg)  11/05/14 297 lb 6 oz (134.888 kg)     Lab Results  Component Value Date   WBC 5.5 09/20/2014   HGB 12.0 09/20/2014   HCT 35.7* 09/20/2014   PLT 223.0 09/20/2014   GLUCOSE 100* 09/13/2014   NA 134* 09/13/2014   K 3.8 09/13/2014   CL 105 09/13/2014   CREATININE 0.90 09/13/2014   BUN 10 09/13/2014   CO2 24  09/13/2014    No results found for: TSH Lab Results  Component Value Date   WBC 5.5 09/20/2014   HGB 12.0 09/20/2014   HCT 35.7* 09/20/2014   MCV 85.7 09/20/2014   PLT 223.0 09/20/2014   Lab Results  Component Value Date   NA 134* 09/13/2014   K 3.8 09/13/2014   CO2 24 09/13/2014   GLUCOSE 100* 09/13/2014   BUN 10 09/13/2014   CREATININE 0.90 09/13/2014   CALCIUM 8.5 09/13/2014   ANIONGAP 5 09/13/2014   No results found for: CHOL No results found for: HDL No results found for: LDLCALC No results found for: TRIG No results found for: CHOLHDL No results found for: ZOXW9U     Assessment & Plan:   Problem List Items Addressed This Visit    Preventative health care - Primary    Patient encouraged to maintain heart healthy diet, regular exercise, adequate sleep. Consider daily probiotics. Take medications as prescribed. Doing well follows with GYN for paps         Ms. Alexie does not currently have medications on file.  No orders of the defined types were placed in this encounter.     Pre-Visit Info 11/16/2015 3:05 PM  Medication: No medications, OTC, or supplements per pt.   Preferred Pharmacy and which med where: [facility] - [place], [place] - [address]  Allergies verified: NKA  Immunization Status: Flu vaccine: 05/19/15 Tdap: 03/20/14 PNA: N/A Shingles: N/A  A/P:  Changes to FH, PSH or Personal Hx: None Pap: 10/19/14 normal per pt MMG: N/A Bone Density: N/A CCS: N/A  Care Teams Updated:  Dr. Nena Jordan [facility]  ED/Hospital/Urgent Care Visits: None  To Discuss with Provider: No concerns at time of call.   Danise Edge, MD

## 2016-06-07 ENCOUNTER — Encounter: Payer: Self-pay | Admitting: Family Medicine

## 2016-06-07 ENCOUNTER — Ambulatory Visit (INDEPENDENT_AMBULATORY_CARE_PROVIDER_SITE_OTHER): Payer: 59 | Admitting: Family Medicine

## 2016-06-07 ENCOUNTER — Ambulatory Visit: Payer: 59 | Admitting: Family Medicine

## 2016-06-07 VITALS — BP 130/78 | HR 101 | Temp 98.2°F | Ht 69.5 in | Wt 279.6 lb

## 2016-06-07 DIAGNOSIS — R112 Nausea with vomiting, unspecified: Secondary | ICD-10-CM

## 2016-06-07 DIAGNOSIS — M546 Pain in thoracic spine: Secondary | ICD-10-CM | POA: Diagnosis not present

## 2016-06-07 LAB — POC URINALSYSI DIPSTICK (AUTOMATED)
Bilirubin, UA: NEGATIVE
Blood, UA: NEGATIVE
Glucose, UA: NEGATIVE
KETONES UA: NEGATIVE
LEUKOCYTES UA: NEGATIVE
Nitrite, UA: NEGATIVE
Spec Grav, UA: 1.025
Urobilinogen, UA: 0.2
pH, UA: 6.5

## 2016-06-07 LAB — POCT URINE PREGNANCY: PREG TEST UR: NEGATIVE

## 2016-06-07 MED ORDER — NAPROXEN 125 MG/5ML PO SUSP: 500.0000 mg | Freq: Two times a day (BID) | ORAL | 0 refills | Status: DC | PRN

## 2016-06-07 MED ORDER — ONDANSETRON 4 MG PO TBDP: 4.0000 mg | ORAL_TABLET | Freq: Three times a day (TID) | ORAL | 0 refills | Status: DC | PRN

## 2016-06-07 NOTE — Progress Notes (Signed)
Pre visit review using our clinic review tool, if applicable. No additional management support is needed unless otherwise documented below in the visit note. 

## 2016-06-07 NOTE — Progress Notes (Signed)
Musculoskeletal Exam  Patient: Sandy Love DOB: 10/05/1980  DOS: 06/07/2016  SUBJECTIVE:  Chief Complaint:   Chief Complaint  Patient presents with  . Back Pain    (R) side-started last week-along with vomiting x 2 days ago    Sandy Love is a 35 y.o.  female for evaluation and treatment of her back pain.   Onset:  1 week ago. Sudden- not related to any injury or change in activity.  Location: middle Character:  sharp and intermittent- had 3 episodes since last week. Lasts for less than 1 second at a time. Progression of issue:  is unchanged Associated symptoms: Had 1 episode of N/V 2 days ago with some cramping which she said is similar to menstrual cramps. She does not currently have N/V or abdominal discomfort. Denies bowel changes, sick contacts, fevers, urinary pain/blood/frequency Treatment: to date has been: none.   Neurovascular symptoms: no  ROS: Gastrointestinal: +N/V, no bowel changes Genitourinary: No bladder incontinence, frequency, bleeding, pain Musculoskeletal/Extremities: +back pain Neurologic: no numbness, tingling no weakness   Past Medical History:  Diagnosis Date  . History of miscarriage 09/20/2014   X 3    . Obesity 09/20/2014  . Pedal edema 02/27/2015  . Preventative health care 11/18/2014   Past Surgical History:  Procedure Laterality Date  . DILATION AND CURETTAGE OF UTERUS     Family History  Problem Relation Age of Onset  . Diabetes Maternal Grandmother   . Diabetes Paternal Grandmother   . Obesity Sister   . Renal Disease Brother     single kidney  . Dementia Paternal Grandfather   . Obesity Brother   . Cancer Maternal Aunt [id number]    breast   Takes no medications routinely.  No Known Allergies Social History   Social History  . Marital status: Single   Social History Main Topics  . Smoking status: Former Smoker    Types: Cigars    Quit date: 07/29/2014  . Smokeless tobacco: Never Used  . Alcohol use No  . Drug use: No  .  Sexual activity: Yes    Birth control/ protection: None     Comment: lives with boyfriend, works with [employer], no dietary restrictions   Objective:  VITAL SIGNS: BP 130/78 (BP Location: Right Arm, Patient Position: Sitting, Cuff Size: Large)   Pulse (!) 101   Temp 98.2 F (36.8 C) (Oral)   Ht 5' 9.5" (1.765 m)   Wt 279 lb 9.6 oz (126.8 kg)   LMP 05/12/2016 (Approximate)   SpO2 97%   BMI 40.70 kg/m  Constitutional: Well formed, well developed. No acute distress. HENT: Normocephalic, atraumatic.  Moist mucous membranes.  Eyes:  EOM grossly intact. Conjunctiva normal.  Neck:  Full range of motion.   Cardiovascular: RRR, no murmurs Thorax & Lungs:  CTAB, no wheezing or rales, no accessory muscle use Abdomen: BS+, soft, nontender, nondistended, no guarding, no masses or organomegaly, neg Carnett's, McBurney's, Murphy's, Rovsing's Musculoskeletal: mid back.   Normal active range of motion: yes.   Normal passive range of motion: yes Tenderness to palpation: no Deformity: no Ecchymosis: no Straight leg test: negative for Neurologic: Normal sensory function. No focal deficits noted. No clonus. Psychiatric: Normal mood. Age appropriate judgment and insight. Alert & oriented x 3.    Assessment:  Acute right-sided thoracic back pain - Plan: POCT Urinalysis Dipstick (Automated), Urine Culture, naproxen (NAPROSYN) 125 MG/5ML suspension  Non-intractable vomiting with nausea, unspecified vomiting type - Plan: POCT Urinalysis Dipstick (Automated), POCT urine  pregnancy, Urine Culture, ondansetron (ZOFRAN-ODT) 4 MG disintegrating tablet  Plan: Orders as above. Pt has difficulty with pills. UA and preg test unremarkable. Will culture for piece of mind. If she develops constant pain, fevers, or worsening N/V, let us know. Likelihood of pyelonephritis vs nephrolithiasis low given her presentation and PE. F/u prn otherwise. The patient voiced understanding and agreement to the plan.   Jilda Roche  Wilson, DO 06/07/16  7:49 AM

## 2016-06-08 LAB — URINE CULTURE: ORGANISM ID, BACTERIA: NO GROWTH

## 2016-08-07 ENCOUNTER — Ambulatory Visit (INDEPENDENT_AMBULATORY_CARE_PROVIDER_SITE_OTHER): Payer: 59 | Admitting: Internal Medicine

## 2016-08-07 ENCOUNTER — Encounter: Payer: Self-pay | Admitting: Internal Medicine

## 2016-08-07 ENCOUNTER — Telehealth: Payer: Self-pay | Admitting: Family Medicine

## 2016-08-07 VITALS — BP 124/76 | HR 88 | Temp 100.1°F | Resp 14 | Ht 69.5 in | Wt 279.0 lb

## 2016-08-07 DIAGNOSIS — R05 Cough: Secondary | ICD-10-CM | POA: Diagnosis not present

## 2016-08-07 DIAGNOSIS — R059 Cough, unspecified: Secondary | ICD-10-CM

## 2016-08-07 DIAGNOSIS — R509 Fever, unspecified: Secondary | ICD-10-CM

## 2016-08-07 LAB — POCT INFLUENZA A/B
INFLUENZA B, POC: NEGATIVE
Influenza A, POC: NEGATIVE

## 2016-08-07 LAB — POCT RAPID STREP A (OFFICE): RAPID STREP A SCREEN: NEGATIVE

## 2016-08-07 MED ORDER — AMOXICILLIN 500 MG PO CAPS
1000.0000 mg | ORAL_CAPSULE | Freq: Two times a day (BID) | ORAL | 0 refills | Status: DC
Start: 2016-08-07 — End: 2016-08-08

## 2016-08-07 NOTE — Progress Notes (Signed)
Pre visit review using our clinic review tool, if applicable. No additional management support is needed unless otherwise documented below in the visit note. 

## 2016-08-07 NOTE — Progress Notes (Signed)
Subjective:    Patient ID: Sandy Love, female    DOB: 01/22/1981, 35 y.o.   MRN: [id number]  DOS:  08/07/2016 Type of visit - description : Acute Interval history: Symptoms started yesterday with mainly cough associated with severe chest burning at the tracheal area. Mild yellow sputum production. 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  Review of Systems  Some chills this morning. Denies any sinus congestion, nasal discharge, sore throat. Had vomited once due to severe cough otherwise no nausea or diarrhea No myalgias Past Medical History:  Diagnosis Date  . History of miscarriage 09/20/2014   X 3    . Obesity 09/20/2014  . Pedal edema 02/27/2015  . Preventative health care 11/18/2014    Past Surgical History:  Procedure Laterality Date  . DILATION AND CURETTAGE OF UTERUS      Social History   Social History  . Marital status: Single    Spouse name: N/A  . Number of children: N/A  . Years of education: N/A   Occupational History  . Not on file.   Social History Main Topics  . Smoking status: Former Smoker    Types: Cigars    Quit date: 07/29/2014  . Smokeless tobacco: Never Used  . Alcohol use No  . Drug use: No  . Sexual activity: Yes    Birth control/ protection: None     Comment: lives with boyfriend, works with [employer], no dietary restrictions   Other Topics Concern  . Not on file   Social History Narrative  . No narrative on file      Allergies as of 08/07/2016   No Known Allergies     Medication List       Accurate as of 08/07/16 11:59 PM. Always use your most recent med list.          amoxicillin 500 MG capsule Commonly known as:  AMOXIL Take 2 capsules (1,000 mg total) by mouth 2 (two) times daily.          Objective:   Physical Exam BP 124/76 (BP Location: Left Arm, Patient Position: Sitting, Cuff Size: Normal)   Pulse 88    Temp 100.1 F (37.8 C) (Oral)   Resp 14   Ht 5' 9.5" (1.765 m)   Wt 279 lb (126.6 kg)   LMP 07/13/2016 (Approximate)   SpO2 97%   Breastfeeding? No   BMI 40.61 kg/m  General:   Well developed, well nourished . NAD.  HEENT:  Normocephalic . Face symmetric, atraumatic. TMs slightly bulge, minimal redness. No discharge. Throat symmetric, no red. Sinuses no TTP, nose is slightly congested Lungs:  Clear except for a few rhonchi with cough Normal respiratory effort, no intercostal retractions, no accessory muscle use. Heart: RRR,  no murmur.  No pretibial edema bilaterally  Skin: Not pale. Not jaundice Neurologic:  alert & oriented X3.  Speech normal, gait appropriate for age and unassisted Psych--  Cognition and judgment appear intact.  Cooperative with normal attention span and concentration.  Behavior appropriate. No anxious or depressed appearing.      Assessment & Plan:   35 y/o female ,history of obesity. Not on birth control. Cough: Presents with cough and fever, strep test and flu  test negative. Symptoms consistent with tracheitis. Recommend conservative treatment, if not better start amoxicillin in 4-5 days, not before. She  has fever and is probably contagious, needs to go home. If the cough increases we could rx hydrocodone prn See AVS.

## 2016-08-07 NOTE — Patient Instructions (Addendum)
Rest, fluids , tylenol  For cough:  Take Mucinex DM twice a day as needed until better  if nasal congestion: Use OTC Nasocort or Flonase : 2 nasal sprays on each side of the nose in the morning until you feel better   Take the antibiotic as prescribed  (Amoxicillin) IF NO BETTER IN 4-5 DAYS   Call if not gradually better over the next  10 days  Call anytime if the symptoms are severe

## 2016-08-08 ENCOUNTER — Telehealth: Payer: Self-pay | Admitting: Family Medicine

## 2016-08-08 MED ORDER — AMOXICILLIN 500 MG PO CAPS: 1000.0000 mg | ORAL_CAPSULE | Freq: Two times a day (BID) | ORAL | 0 refills | Status: DC

## 2016-08-08 MED FILL — AMOXICILLIN 500 MG CAPSULE: 500 | 7 days supply | Qty: 28 | Fill #0

## 2016-08-08 NOTE — Telephone Encounter (Signed)
Rx sent 

## 2016-08-08 NOTE — Telephone Encounter (Signed)
Pt left written Rx at home. Wondering if Rx can be sent electronically to pharmacy and she will bring in written tomorrow when she comes to work.

## 2016-08-08 NOTE — Telephone Encounter (Signed)
Was given a prescription for amoxicillin, recommend to use it.

## 2016-08-08 NOTE — Telephone Encounter (Signed)
Pt saw Dr. Drue NovelPaz on yesterday, pt states she has developed a ear ache, coughing and congestion has improved some but not 100%, pt would like to know if Dr. Drue NovelPaz and call in the antibiotic that was given to her so she can pick it up on her way home today, pt states she left the handwritten rx at home and will bring it back tomorrow.

## 2016-08-08 NOTE — Telephone Encounter (Signed)
Yes

## 2016-08-21 NOTE — Telephone Encounter (Signed)
Error/gd °

## 2016-09-21 ENCOUNTER — Telehealth: Payer: Self-pay | Admitting: Family Medicine

## 2016-09-21 NOTE — Telephone Encounter (Signed)
Error

## 2016-11-14 DIAGNOSIS — H52221 Regular astigmatism, right eye: Secondary | ICD-10-CM | POA: Diagnosis not present

## 2016-11-14 DIAGNOSIS — H5211 Myopia, right eye: Secondary | ICD-10-CM | POA: Diagnosis not present

## 2016-11-14 DIAGNOSIS — H5201 Hypermetropia, right eye: Secondary | ICD-10-CM | POA: Diagnosis not present

## 2016-11-20 ENCOUNTER — Encounter: Payer: Self-pay | Admitting: Family Medicine

## 2016-11-20 NOTE — Progress Notes (Signed)
[facility]  Glasses Rx;   OD; sphere -4.00 , Cylinder +1.75, Axis 7  OS sphere +1.25. Cylinder +1.00, Axis 36

## 2016-11-27 ENCOUNTER — Encounter: Payer: 59 | Admitting: Family Medicine

## 2016-11-30 DIAGNOSIS — H35413 Lattice degeneration of retina, bilateral: Secondary | ICD-10-CM | POA: Diagnosis not present

## 2016-11-30 DIAGNOSIS — H35421 Microcystoid degeneration of retina, right eye: Secondary | ICD-10-CM | POA: Diagnosis not present

## 2016-11-30 DIAGNOSIS — H33322 Round hole, left eye: Secondary | ICD-10-CM | POA: Diagnosis not present

## 2016-12-13 ENCOUNTER — Encounter: Payer: Self-pay | Admitting: Family Medicine

## 2016-12-13 ENCOUNTER — Ambulatory Visit (INDEPENDENT_AMBULATORY_CARE_PROVIDER_SITE_OTHER): Payer: 59 | Admitting: Family Medicine

## 2016-12-13 VITALS — BP 123/99 | HR 69 | Temp 98.2°F | Ht 69.5 in | Wt 292.8 lb

## 2016-12-13 DIAGNOSIS — Z114 Encounter for screening for human immunodeficiency virus [HIV]: Secondary | ICD-10-CM

## 2016-12-13 DIAGNOSIS — Z0000 Encounter for general adult medical examination without abnormal findings: Secondary | ICD-10-CM

## 2016-12-13 DIAGNOSIS — E6609 Other obesity due to excess calories: Secondary | ICD-10-CM

## 2016-12-13 LAB — COMPREHENSIVE METABOLIC PANEL
ALBUMIN: 3.7 g/dL (ref 3.5–5.2)
ALK PHOS: 42 U/L (ref 39–117)
ALT: 17 U/L (ref 0–35)
AST: 15 U/L (ref 0–37)
BILIRUBIN TOTAL: 0.4 mg/dL (ref 0.2–1.2)
BUN: 13 mg/dL (ref 6–23)
CO2: 28 mEq/L (ref 19–32)
CREATININE: 0.85 mg/dL (ref 0.40–1.20)
Calcium: 9 mg/dL (ref 8.4–10.5)
Chloride: 104 mEq/L (ref 96–112)
GFR: 97.85 mL/min (ref >60.00)
Glucose, Bld: 83 mg/dL (ref 70–99)
Potassium: 4.1 mEq/L (ref 3.5–5.1)
SODIUM: 135 meq/L (ref 135–145)
TOTAL PROTEIN: 6.7 g/dL (ref 6.0–8.3)

## 2016-12-13 LAB — LIPID PANEL
CHOLESTEROL: 200 mg/dL (ref 0–200)
HDL: 59.8 mg/dL (ref >39.00)
LDL CALC: 131 mg/dL — AB (ref 0–99)
NonHDL: 140.65
Total CHOL/HDL Ratio: 3
Triglycerides: 48 mg/dL (ref 0.0–149.0)
VLDL: 9.6 mg/dL (ref 0.0–40.0)

## 2016-12-13 LAB — CBC
HCT: 40.7 % (ref 36.0–46.0)
Hemoglobin: 13.8 g/dL (ref 12.0–15.0)
MCHC: 33.9 g/dL (ref 30.0–36.0)
MCV: 90.5 fl (ref 78.0–100.0)
Platelets: 184 10*3/uL (ref 150.0–400.0)
RBC: 4.49 Mil/uL (ref 3.87–5.11)
RDW: 14.2 % (ref 11.5–15.5)
WBC: 4.8 10*3/uL (ref 4.0–10.5)

## 2016-12-13 LAB — HIV ANTIBODY (ROUTINE TESTING W REFLEX): HIV: NONREACTIVE

## 2016-12-13 LAB — TSH: TSH: 2.54 u[IU]/mL (ref 0.35–4.50)

## 2016-12-13 MED ORDER — PRENATAL VITAMINS 0.8 MG PO TABS: 1.0000 | ORAL_TABLET | Freq: Every day | ORAL | 5 refills | Status: DC

## 2016-12-13 MED ORDER — FOLIC ACID 1 MG PO TABS: 1.0000 mg | ORAL_TABLET | Freq: Every day | ORAL | 5 refills | Status: DC

## 2016-12-13 MED FILL — FOLIC ACID 1 MG TABLET: 1 | 30 days supply | Qty: 30 | Fill #0

## 2016-12-13 MED FILL — PRENATAL VITAMIN PLUS LOW I: 27-1 | 30 days supply | Qty: 30 | Fill #0

## 2016-12-13 NOTE — Progress Notes (Signed)
Patient ID: Sandy Love, female   DOB: [birth year], 36 y.o.   MRN: [id number]   Subjective:  I acted as a Neurosurgeon for Danise Edge, MD. [place], [place]   Patient ID: Sandy Love, female    DOB: [birth year], 36 y.o.   MRN: [id number]  Chief Complaint  Patient presents with  . Annual Exam    HPI  Patient is in today for an annual examination. Patient states that she would like a prescription for prenatal vitamins and folic acid. Also wants to discuss her recent weight gain. Patient has a Hx of obesity, pedal edema. Patient has no acute concerns noted at this time. She is not actively avoiding pregnancy but has not become pregnant yet. She is seeing her GYN soon to discuss options. No recent febrile illness or hospitalizations. Denies CP/palp/SOB/HA/congestion/fevers/GI or GU c/o. Taking meds as prescribed  Patient Care Team: Bradd Canary, MD as PCP - General (Family Medicine) Maxie Better, MD as Consulting Physician (Obstetrics and Gynecology)   Past Medical History:  Diagnosis Date  . History of miscarriage 09/20/2014   X 3    . Obesity 09/20/2014  . Pedal edema 02/27/2015  . Preventative health care 11/18/2014    Past Surgical History:  Procedure Laterality Date  . DILATION AND CURETTAGE OF UTERUS      Family History  Problem Relation Age of Onset  . Diabetes Maternal Grandmother   . Diabetes Paternal Grandmother   . Obesity Sister   . Renal Disease Brother     single kidney  . Dementia Paternal Grandfather   . Obesity Brother   . Cancer Maternal Aunt 61    breast    Social History   Social History  . Marital status: Single    Spouse name: N/A  . Number of children: N/A  . Years of education: N/A   Occupational History  . Not on file.   Social History Main Topics  . Smoking status: Former Smoker    Types: Cigars    Quit date: 07/29/2014  . Smokeless tobacco: Never Used  . Alcohol use No  . Drug use: No  . Sexual activity: Yes    Birth control/  protection: None     Comment: lives with boyfriend, works with [employer], no dietary restrictions   Other Topics Concern  . Not on file   Social History Narrative  . No narrative on file    Outpatient Medications Prior to Visit  Medication Sig Dispense Refill  . amoxicillin (AMOXIL) 500 MG capsule Take 2 capsules (1,000 mg total) by mouth 2 (two) times daily. (Patient not taking: Reported on 12/13/2016) 28 capsule 0   No facility-administered medications prior to visit.     No Known Allergies  Review of Systems  Constitutional: Positive for malaise/fatigue. Negative for fever.  HENT: Negative for congestion.   Eyes: Negative for blurred vision.  Respiratory: Negative for cough and shortness of breath.   Cardiovascular: Negative for chest pain, palpitations and leg swelling.  Gastrointestinal: Negative for vomiting.  Musculoskeletal: Negative for back pain.  Skin: Negative for rash.  Neurological: Negative for loss of consciousness and headaches.  Psychiatric/Behavioral: Negative for substance abuse and suicidal ideas.       Objective:    Physical Exam  Constitutional: She is oriented to person, place, and time. She appears well-developed and well-nourished. No distress.  HENT:  Head: Normocephalic and atraumatic.  Eyes: Conjunctivae are normal.  Neck: Normal range of motion. No thyromegaly present.  Cardiovascular:  Normal rate and regular rhythm.   Pulmonary/Chest: Effort normal and breath sounds normal. She has no wheezes.  Abdominal: Soft. Bowel sounds are normal. There is no tenderness.  Musculoskeletal: She exhibits no edema or deformity.  Neurological: She is alert and oriented to person, place, and time.  Skin: Skin is warm and dry. She is not diaphoretic.  Psychiatric: She has a normal mood and affect.    BP (!) 123/99 (BP Location: Left Wrist, Patient Position: Sitting, Cuff Size: Normal)   Pulse 69   Temp 98.2 F (36.8 C) (Oral)   Ht 5' 9.5" (1.765 m)   Wt 292  lb 12.8 oz (132.8 kg)   LMP 11/29/2016   SpO2 100% Comment: RA  BMI 42.62 kg/m  Wt Readings from Last 3 Encounters:  12/13/16 292 lb 12.8 oz (132.8 kg)  08/07/16 279 lb (126.6 kg)  06/07/16 279 lb 9.6 oz (126.8 kg)   BP Readings from Last 3 Encounters:  12/13/16 (!) 123/99  08/07/16 124/76  06/07/16 130/78     Immunization History  Administered Date(s) Administered  . Influenza,inj,Quad PF,36+ Mos 05/20/2014  . Influenza-Unspecified 05/19/2015, 05/20/2016  . Tdap 03/20/2014    Health Maintenance  Topic Date Due  . INFLUENZA VACCINE  03/20/2017  . PAP SMEAR  10/18/2017  . TETANUS/TDAP  03/20/2024  . HIV Screening  Completed    Lab Results  Component Value Date   WBC 4.8 12/13/2016   HGB 13.8 12/13/2016   HCT 40.7 12/13/2016   PLT 184.0 12/13/2016   GLUCOSE 83 12/13/2016   CHOL 200 12/13/2016   TRIG 48.0 12/13/2016   HDL 59.80 12/13/2016   LDLCALC 131 (H) 12/13/2016   ALT 17 12/13/2016   AST 15 12/13/2016   NA 135 12/13/2016   K 4.1 12/13/2016   CL 104 12/13/2016   CREATININE 0.85 12/13/2016   BUN 13 12/13/2016   CO2 28 12/13/2016   TSH 2.54 12/13/2016    Lab Results  Component Value Date   TSH 2.54 12/13/2016   Lab Results  Component Value Date   WBC 4.8 12/13/2016   HGB 13.8 12/13/2016   HCT 40.7 12/13/2016   MCV 90.5 12/13/2016   PLT 184.0 12/13/2016   Lab Results  Component Value Date   NA 135 12/13/2016   K 4.1 12/13/2016   CO2 28 12/13/2016   GLUCOSE 83 12/13/2016   BUN 13 12/13/2016   CREATININE 0.85 12/13/2016   BILITOT 0.4 12/13/2016   ALKPHOS 42 12/13/2016   AST 15 12/13/2016   ALT 17 12/13/2016   PROT 6.7 12/13/2016   ALBUMIN 3.7 12/13/2016   CALCIUM 9.0 12/13/2016   ANIONGAP 5 09/13/2014   GFR 97.85 12/13/2016   Lab Results  Component Value Date   CHOL 200 12/13/2016   Lab Results  Component Value Date   HDL 59.80 12/13/2016   Lab Results  Component Value Date   LDLCALC 131 (H) 12/13/2016   Lab Results  Component  Value Date   TRIG 48.0 12/13/2016   Lab Results  Component Value Date   CHOLHDL 3 12/13/2016   No results found for: HGBA1C       Assessment & Plan:   Problem List Items Addressed This Visit    Obesity    Encouraged DASH diet, decrease po intake and increase exercise as tolerated. Needs 7-8 hours of sleep nightly. Avoid trans fats, eat small, frequent meals every 4-5 hours with lean proteins, complex carbs and healthy fats. Minimize simple carbs, bariatric referral  Preventative health care - Primary    Patient encouraged to maintain heart healthy diet, regular exercise, adequate sleep. Consider daily probiotics. Take medications as prescribed. Is actively considering pregnancy and has not been actively trying to avoid it. She has an appt with her GYN soon, Dr Beatrix Shipper to discuss her options. She is given a prescription for Folic acid and PNV, HIV testing is negative. Maintain adequate hydration.       Relevant Orders   CBC (Completed)   Comprehensive metabolic panel (Completed)   Lipid panel (Completed)   TSH (Completed)   HIV antibody (Completed)    Other Visit Diagnoses    Encounter for screening for HIV       Relevant Orders   HIV antibody (Completed)      I have discontinued Ms. Budlong's amoxicillin. I am also having her start on folic acid and Prenatal Vitamins.  Meds ordered this encounter  Medications  . folic acid (FOLVITE) 1 MG tablet    Sig: Take 1 tablet (1 mg total) by mouth daily.    Dispense:  30 tablet    Refill:  5  . Prenatal Multivit-Min-Fe-FA (PRENATAL VITAMINS) 0.8 MG tablet    Sig: Take 1 tablet by mouth daily.    Dispense:  30 tablet    Refill:  5    CMA served as scribe during this visit. History, Physical and Plan performed by medical provider. Documentation and orders reviewed and attested to.  Danise Edge, MD

## 2016-12-13 NOTE — Progress Notes (Signed)
Pre visit review using our clinic review tool, if applicable. No additional management support is needed unless otherwise documented below in the visit note. 

## 2016-12-13 NOTE — Patient Instructions (Signed)
Preventive Care 18-39 Years, Female Preventive care refers to lifestyle choices and visits with your health care provider that can promote health and wellness. What does preventive care include?  A yearly physical exam. This is also called an annual well check.  Dental exams once or twice a year.  Routine eye exams. Ask your health care provider how often you should have your eyes checked.  Personal lifestyle choices, including:  Daily care of your teeth and gums.  Regular physical activity.  Eating a healthy diet.  Avoiding tobacco and drug use.  Limiting alcohol use.  Practicing safe sex.  Taking vitamin and mineral supplements as recommended by your health care provider. What happens during an annual well check? The services and screenings done by your health care provider during your annual well check will depend on your age, overall health, lifestyle risk factors, and family history of disease. Counseling  Your health care provider may ask you questions about your:  Alcohol use.  Tobacco use.  Drug use.  Emotional well-being.  Home and relationship well-being.  Sexual activity.  Eating habits.  Work and work environment.  Method of birth control.  Menstrual cycle.  Pregnancy history. Screening  You may have the following tests or measurements:  Height, weight, and BMI.  Diabetes screening. This is done by checking your blood sugar (glucose) after you have not eaten for a while (fasting).  Blood pressure.  Lipid and cholesterol levels. These may be checked every 5 years starting at age 35.  Skin check.  Hepatitis C blood test.  Hepatitis B blood test.  Sexually transmitted disease (STD) testing.  BRCA-related cancer screening. This may be done if you have a family history of breast, ovarian, tubal, or peritoneal cancers.  Pelvic exam and Pap test. This may be done every 3 years starting at age 35. Starting at age 35, this may be done every 5  years if you have a Pap test in combination with an HPV test. Discuss your test results, treatment options, and if necessary, the need for more tests with your health care provider. Vaccines  Your health care provider may recommend certain vaccines, such as:  Influenza vaccine. This is recommended every year.  Tetanus, diphtheria, and acellular pertussis (Tdap, Td) vaccine. You may need a Td booster every 10 years.  Varicella vaccine. You may need this if you have not been vaccinated.  HPV vaccine. If you are 35 or younger, you may need three doses over 6 months.  Measles, mumps, and rubella (MMR) vaccine. You may need at least one dose of MMR. You may also need a second dose.  Pneumococcal 13-valent conjugate (PCV13) vaccine. You may need this if you have certain conditions and were not previously vaccinated.  Pneumococcal polysaccharide (PPSV23) vaccine. You may need one or two doses if you smoke cigarettes or if you have certain conditions.  Meningococcal vaccine. One dose is recommended if you are age 35-37 years and a first-year college student living in a residence hall, or if you have one of several medical conditions. You may also need additional booster doses.  Hepatitis A vaccine. You may need this if you have certain conditions or if you travel or work in places where you may be exposed to hepatitis A.  Hepatitis B vaccine. You may need this if you have certain conditions or if you travel or work in places where you may be exposed to hepatitis B.  Haemophilus influenzae type b (Hib) vaccine. You may need this   if you have certain risk factors. Talk to your health care provider about which screenings and vaccines you need and how often you need them. This information is not intended to replace advice given to you by your health care provider. Make sure you discuss any questions you have with your health care provider. Document Released: 10/02/2001 Document Revised: 04/25/2016  Document Reviewed: 06/07/2015 Elsevier Interactive Patient Education  2017 [employer].

## 2016-12-16 NOTE — Assessment & Plan Note (Signed)
Patient encouraged to maintain heart healthy diet, regular exercise, adequate sleep. Consider daily probiotics. Take medications as prescribed. Is actively considering pregnancy and has not been actively trying to avoid it. She has an appt with her GYN soon, Dr Beatrix Shipper to discuss her options. She is given a prescription for Folic acid and PNV, HIV testing is negative. Maintain adequate hydration.

## 2016-12-16 NOTE — Assessment & Plan Note (Signed)
Encouraged DASH diet, decrease po intake and increase exercise as tolerated. Needs 7-8 hours of sleep nightly. Avoid trans fats, eat small, frequent meals every 4-5 hours with lean proteins, complex carbs and healthy fats. Minimize simple carbs, bariatric referral 

## 2017-01-08 ENCOUNTER — Encounter: Payer: Self-pay | Admitting: Family Medicine

## 2017-01-08 NOTE — Progress Notes (Signed)
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 Given she is asymptomatic and there is no subretinal fluid she was advised no treatment at this time.     PC

## 2017-01-10 DIAGNOSIS — Z1159 Encounter for screening for other viral diseases: Secondary | ICD-10-CM | POA: Diagnosis not present

## 2017-01-10 DIAGNOSIS — Z118 Encounter for screening for other infectious and parasitic diseases: Secondary | ICD-10-CM | POA: Diagnosis not present

## 2017-01-10 DIAGNOSIS — Z114 Encounter for screening for human immunodeficiency virus [HIV]: Secondary | ICD-10-CM | POA: Diagnosis not present

## 2017-01-10 DIAGNOSIS — Z113 Encounter for screening for infections with a predominantly sexual mode of transmission: Secondary | ICD-10-CM | POA: Diagnosis not present

## 2017-01-23 DIAGNOSIS — Z1151 Encounter for screening for human papillomavirus (HPV): Secondary | ICD-10-CM | POA: Diagnosis not present

## 2017-01-23 DIAGNOSIS — N96 Recurrent pregnancy loss: Secondary | ICD-10-CM | POA: Diagnosis not present

## 2017-01-23 DIAGNOSIS — Z6841 Body Mass Index (BMI) 40.0 and over, adult: Secondary | ICD-10-CM | POA: Diagnosis not present

## 2017-01-23 DIAGNOSIS — Z01419 Encounter for gynecological examination (general) (routine) without abnormal findings: Secondary | ICD-10-CM | POA: Diagnosis not present

## 2017-01-23 DIAGNOSIS — Z131 Encounter for screening for diabetes mellitus: Secondary | ICD-10-CM | POA: Diagnosis not present

## 2017-01-23 DIAGNOSIS — E282 Polycystic ovarian syndrome: Secondary | ICD-10-CM | POA: Diagnosis not present

## 2017-01-25 MED FILL — HYDROCODON-APAP 5-325: 5-325 | 5 days supply | Qty: 20 | Fill #0

## 2017-01-25 MED FILL — IBUPROFEN 600 MG TABLET: 600 | 6 days supply | Qty: 30 | Fill #0

## 2017-01-25 MED FILL — CHLORHEXIDINE 0.12% RINSE: 0.12 | 16 days supply | Qty: 473 | Fill #0

## 2017-03-05 ENCOUNTER — Encounter: Payer: 59 | Admitting: Family Medicine

## 2017-08-28 DIAGNOSIS — N9089 Other specified noninflammatory disorders of vulva and perineum: Secondary | ICD-10-CM | POA: Diagnosis not present

## 2017-08-28 DIAGNOSIS — A630 Anogenital (venereal) warts: Secondary | ICD-10-CM | POA: Diagnosis not present

## 2017-09-06 DIAGNOSIS — N9089 Other specified noninflammatory disorders of vulva and perineum: Secondary | ICD-10-CM | POA: Diagnosis not present

## 2017-11-14 DIAGNOSIS — H52222 Regular astigmatism, left eye: Secondary | ICD-10-CM | POA: Diagnosis not present

## 2017-11-14 DIAGNOSIS — H52221 Regular astigmatism, right eye: Secondary | ICD-10-CM | POA: Diagnosis not present

## 2017-11-14 DIAGNOSIS — H5211 Myopia, right eye: Secondary | ICD-10-CM | POA: Diagnosis not present

## 2017-11-14 DIAGNOSIS — H5202 Hypermetropia, left eye: Secondary | ICD-10-CM | POA: Diagnosis not present

## 2017-12-17 ENCOUNTER — Ambulatory Visit (INDEPENDENT_AMBULATORY_CARE_PROVIDER_SITE_OTHER): Payer: 59 | Admitting: Family Medicine

## 2017-12-17 ENCOUNTER — Encounter: Payer: Self-pay | Admitting: Family Medicine

## 2017-12-17 DIAGNOSIS — E6609 Other obesity due to excess calories: Secondary | ICD-10-CM | POA: Diagnosis not present

## 2017-12-17 DIAGNOSIS — Z0000 Encounter for general adult medical examination without abnormal findings: Secondary | ICD-10-CM

## 2017-12-17 DIAGNOSIS — Z720 Tobacco use: Secondary | ICD-10-CM | POA: Diagnosis not present

## 2017-12-17 LAB — CBC
HCT: 39.4 % (ref 36.0–46.0)
Hemoglobin: 13.2 g/dL (ref 12.0–15.0)
MCHC: 33.4 g/dL (ref 30.0–36.0)
MCV: 90.4 fl (ref 78.0–100.0)
PLATELETS: 228 10*3/uL (ref 150.0–400.0)
RBC: 4.36 Mil/uL (ref 3.87–5.11)
RDW: 14.5 % (ref 11.5–15.5)
WBC: 5.3 10*3/uL (ref 4.0–10.5)

## 2017-12-17 LAB — LIPID PANEL
CHOL/HDL RATIO: 4
CHOLESTEROL: 181 mg/dL (ref 0–200)
HDL: 47.7 mg/dL (ref >39.00)
LDL CALC: 117 mg/dL — AB (ref 0–99)
NonHDL: 133.06
TRIGLYCERIDES: 81 mg/dL (ref 0.0–149.0)
VLDL: 16.2 mg/dL (ref 0.0–40.0)

## 2017-12-17 LAB — TSH: TSH: 4.4 u[IU]/mL (ref 0.35–4.50)

## 2017-12-17 LAB — COMPREHENSIVE METABOLIC PANEL
ALBUMIN: 3.6 g/dL (ref 3.5–5.2)
ALT: 10 U/L (ref 0–35)
AST: 11 U/L (ref 0–37)
Alkaline Phosphatase: 45 U/L (ref 39–117)
BILIRUBIN TOTAL: 0.3 mg/dL (ref 0.2–1.2)
BUN: 11 mg/dL (ref 6–23)
CALCIUM: 9 mg/dL (ref 8.4–10.5)
CHLORIDE: 105 meq/L (ref 96–112)
CO2: 26 meq/L (ref 19–32)
CREATININE: 0.9 mg/dL (ref 0.40–1.20)
GFR: 91.07 mL/min (ref >60.00)
Glucose, Bld: 96 mg/dL (ref 70–99)
Potassium: 4.1 mEq/L (ref 3.5–5.1)
SODIUM: 138 meq/L (ref 135–145)
Total Protein: 6.5 g/dL (ref 6.0–8.3)

## 2017-12-17 NOTE — Assessment & Plan Note (Signed)
Encouraged DASH diet, decrease po intake and increase exercise as tolerated. Needs 7-8 hours of sleep nightly. Avoid trans fats, eat small, frequent meals every 4-5 hours with lean proteins, complex carbs and healthy fats. Minimize simple carbs, bariatric referral 

## 2017-12-17 NOTE — Progress Notes (Signed)
Subjective:  .pac  Patient ID: Sandy Love, female    DOB: Aug 18, 1981, 37 y.o.   MRN: [id number]  No chief complaint on file.   HPI  Patient is in today for an annual exam and she is frustrated with her weight. She is working full time jobs and gets off of her second job at [employer] not getting home til 2 am and only sleeping 4 hours. She is stress eating and considering going back to school for nursing. No recent febrile illness or hospitalizations. Is noting some swelling over right leg from an old injury, not painful or red at site. Is not eating well or exercising. Denies CP/palp/SOB/HA/congestion/fevers/GI or GU c/o. Taking meds as prescribed. Does well with ADLs  Patient Care Team: Bradd Canary, MD as PCP - General (Family Medicine) Maxie Better, MD as Consulting Physician (Obstetrics and Gynecology)   Past Medical History:  Diagnosis Date  . History of miscarriage 09/20/2014   X 3    . Obesity 09/20/2014  . Pedal edema 02/27/2015  . Preventative health care 11/18/2014    Past Surgical History:  Procedure Laterality Date  . DILATION AND CURETTAGE OF UTERUS      Family History  Problem Relation Age of Onset  . Diabetes Maternal Grandmother   . Diabetes Paternal Grandmother   . Obesity Sister   . Renal Disease Brother        single kidney  . Dementia Paternal Grandfather   . Obesity Brother   . Cancer Maternal Aunt 65       breast    Social History   Socioeconomic History  . Marital status: Single    Spouse name: Not on file  . Number of children: Not on file  . Years of education: Not on file  . Highest education level: Not on file  Occupational History  . Not on file  Social Needs  . Financial resource strain: Not on file  . Food insecurity:    Worry: Not on file    Inability: Not on file  . Transportation needs:    Medical: Not on file    Non-medical: Not on file  Tobacco Use  . Smoking status: Former Smoker    Types: Cigars    Last attempt to  quit: 07/29/2014    Years since quitting: 3.3  . Smokeless tobacco: Never Used  Substance and Sexual Activity  . Alcohol use: No  . Drug use: No  . Sexual activity: Yes    Birth control/protection: None    Comment: lives with boyfriend, works with [employer], no dietary restrictions  Lifestyle  . Physical activity:    Days per week: Not on file    Minutes per session: Not on file  . Stress: Not on file  Relationships  . Social connections:    Talks on phone: Not on file    Gets together: Not on file    Attends religious service: Not on file    Active member of club or organization: Not on file    Attends meetings of clubs or organizations: Not on file    Relationship status: Not on file  . Intimate partner violence:    Fear of current or ex partner: Not on file    Emotionally abused: Not on file    Physically abused: Not on file    Forced sexual activity: Not on file  Other Topics Concern  . Not on file  Social History Narrative  . Not on file  Outpatient Medications Prior to Visit  Medication Sig Dispense Refill  . folic acid (FOLVITE) 1 MG tablet Take 1 tablet (1 mg total) by mouth daily. 30 tablet 5  . Prenatal Multivit-Min-Fe-FA (PRENATAL VITAMINS) 0.8 MG tablet Take 1 tablet by mouth daily. 30 tablet 5   No facility-administered medications prior to visit.     No Known Allergies  Review of Systems  Constitutional: Negative for fever and malaise/fatigue.  HENT: Negative for congestion.   Eyes: Negative for blurred vision.  Respiratory: Negative for shortness of breath.   Cardiovascular: Negative for chest pain, palpitations and leg swelling.  Gastrointestinal: Negative for abdominal pain, blood in stool and nausea.  Genitourinary: Negative for dysuria and frequency.  Musculoskeletal: Negative for falls.  Skin: Negative for rash.  Neurological: Negative for dizziness, loss of consciousness and headaches.  Endo/Heme/Allergies: Negative for environmental allergies.    Psychiatric/Behavioral: Negative for depression. The patient is not nervous/anxious.        Objective:    Physical Exam  Constitutional: She is oriented to person, place, and time. No distress.  HENT:  Head: Normocephalic and atraumatic.  Right Ear: External ear normal.  Left Ear: External ear normal.  Nose: Nose normal.  Mouth/Throat: Oropharynx is clear and moist. No oropharyngeal exudate.  Eyes: Pupils are equal, round, and reactive to light. Conjunctivae are normal. Right eye exhibits no discharge. Left eye exhibits no discharge. No scleral icterus.  Neck: Normal range of motion. Neck supple. No thyromegaly present.  Cardiovascular: Normal rate, regular rhythm, normal heart sounds and intact distal pulses.  No murmur heard. Pulmonary/Chest: Effort normal and breath sounds normal. No respiratory distress. She has no wheezes. She has no rales.  Abdominal: Soft. Bowel sounds are normal. She exhibits no distension and no mass. There is no tenderness.  Musculoskeletal: Normal range of motion. She exhibits no edema or tenderness.  Lymphadenopathy:    She has no cervical adenopathy.  Neurological: She is alert and oriented to person, place, and time. She has normal reflexes. She displays normal reflexes. No cranial nerve deficit. Coordination normal.  Skin: Skin is warm and dry. No rash noted. She is not diaphoretic.    BP 118/80 (BP Location: Left Arm, Patient Position: Sitting, Cuff Size: Normal)   Pulse 86   Temp 98 F (36.7 C) (Oral)   Resp 18   Ht  (1.778 m)   Wt (!) 303 lb 3.2 oz (137.5 kg)   SpO2 98%   BMI 43.50 kg/m  Wt Readings from Last 3 Encounters:  12/17/17 (!) 303 lb 3.2 oz (137.5 kg)  12/13/16 292 lb 12.8 oz (132.8 kg)  08/07/16 279 lb (126.6 kg)   BP Readings from Last 3 Encounters:  12/17/17 118/80  12/13/16 (!) 123/99  08/07/16 124/76     Immunization History  Administered Date(s) Administered  . Influenza,inj,Quad PF,6+ Mos 05/20/2014  .  Influenza-Unspecified 05/19/2015, 05/20/2016  . Tdap 03/20/2014    Health Maintenance  Topic Date Due  . PAP SMEAR  10/18/2017  . INFLUENZA VACCINE  03/20/2018  . TETANUS/TDAP  03/20/2024  . HIV Screening  Completed    Lab Results  Component Value Date   WBC 4.8 12/13/2016   HGB 13.8 12/13/2016   HCT 40.7 12/13/2016   PLT 184.0 12/13/2016   GLUCOSE 83 12/13/2016   CHOL 200 12/13/2016   TRIG 48.0 12/13/2016   HDL 59.80 12/13/2016   LDLCALC 131 (H) 12/13/2016   ALT 17 12/13/2016   AST 15 12/13/2016  NA 135 12/13/2016   K 4.1 12/13/2016   CL 104 12/13/2016   CREATININE 0.85 12/13/2016   BUN 13 12/13/2016   CO2 28 12/13/2016   TSH 2.54 12/13/2016    Lab Results  Component Value Date   TSH 2.54 12/13/2016   Lab Results  Component Value Date   WBC 4.8 12/13/2016   HGB 13.8 12/13/2016   HCT 40.7 12/13/2016   MCV 90.5 12/13/2016   PLT 184.0 12/13/2016   Lab Results  Component Value Date   NA 135 12/13/2016   K 4.1 12/13/2016   CO2 28 12/13/2016   GLUCOSE 83 12/13/2016   BUN 13 12/13/2016   CREATININE 0.85 12/13/2016   BILITOT 0.4 12/13/2016   ALKPHOS 42 12/13/2016   AST 15 12/13/2016   ALT 17 12/13/2016   PROT 6.7 12/13/2016   ALBUMIN 3.7 12/13/2016   CALCIUM 9.0 12/13/2016   ANIONGAP 5 09/13/2014   GFR 97.85 12/13/2016   Lab Results  Component Value Date   CHOL 200 12/13/2016   Lab Results  Component Value Date   HDL 59.80 12/13/2016   Lab Results  Component Value Date   LDLCALC 131 (H) 12/13/2016   Lab Results  Component Value Date   TRIG 48.0 12/13/2016   Lab Results  Component Value Date   CHOLHDL 3 12/13/2016   No results found for: HGBA1C       Assessment & Plan:   Problem List Items Addressed This Visit    Obesity    Encouraged DASH diet, decrease po intake and increase exercise as tolerated. Needs 7-8 hours of sleep nightly. Avoid trans fats, eat small, frequent meals every 4-5 hours with lean proteins, complex carbs and  healthy fats. Minimize simple carbs, bariatric referral      Preventative health care    Patient encouraged to maintain heart healthy diet, regular exercise, adequate sleep. Consider daily probiotics. Take medications as prescribed. Labs ordered today. Follows with Dr Beatrix Shipper of GYN will request pap      Relevant Orders   CBC   Comprehensive metabolic panel   Lipid panel   TSH   Tobacco use    Smokes 2.5 cigars daily. Encouraged complete cessation. Discussed need to quit as relates to risk of numerous cancers, cardiac and pulmonary disease as well as neurologic complications. Counseled for greater than 3 minutes         I have discontinued Nashay M. Bartee's folic acid and Prenatal Vitamins.  No orders of the defined types were placed in this encounter.   CMA served as Neurosurgeon during this visit. History, Physical and Plan performed by medical provider. Documentation and orders reviewed and attested to.  Danise Edge, MD

## 2017-12-17 NOTE — Patient Instructions (Addendum)
MIND or DASH diets Start walking 5 minutes every day then can increase after 10 minutes and so on Preventive Care 18-39 Years, Female Preventive care refers to lifestyle choices and visits with your health care provider that can promote health and wellness. What does preventive care include?  A yearly physical exam. This is also called an annual well check.  Dental exams once or twice a year.  Routine eye exams. Ask your health care provider how often you should have your eyes checked.  Personal lifestyle choices, including: ? Daily care of your teeth and gums. ? Regular physical activity. ? Eating a healthy diet. ? Avoiding tobacco and drug use. ? Limiting alcohol use. ? Practicing safe sex. ? Taking vitamin and mineral supplements as recommended by your health care provider. What happens during an annual well check? The services and screenings done by your health care provider during your annual well check will depend on your age, overall health, lifestyle risk factors, and family history of disease. Counseling Your health care provider may ask you questions about your:  Alcohol use.  Tobacco use.  Drug use.  Emotional well-being.  Home and relationship well-being.  Sexual activity.  Eating habits.  Work and work Statistician.  Method of birth control.  Menstrual cycle.  Pregnancy history.  Screening You may have the following tests or measurements:  Height, weight, and BMI.  Diabetes screening. This is done by checking your blood sugar (glucose) after you have not eaten for a while (fasting).  Blood pressure.  Lipid and cholesterol levels. These may be checked every 5 years starting at age 20.  Skin check.  Hepatitis C blood test.  Hepatitis B blood test.  Sexually transmitted disease (STD) testing.  BRCA-related cancer screening. This may be done if you have a family history of breast, ovarian, tubal, or peritoneal cancers.  Pelvic exam and Pap  test. This may be done every 3 years starting at age 59. Starting at age 77, this may be done every 5 years if you have a Pap test in combination with an HPV test.  Discuss your test results, treatment options, and if necessary, the need for more tests with your health care provider. Vaccines Your health care provider may recommend certain vaccines, such as:  Influenza vaccine. This is recommended every year.  Tetanus, diphtheria, and acellular pertussis (Tdap, Td) vaccine. You may need a Td booster every 10 years.  Varicella vaccine. You may need this if you have not been vaccinated.  HPV vaccine. If you are 76 or younger, you may need three doses over 6 months.  Measles, mumps, and rubella (MMR) vaccine. You may need at least one dose of MMR. You may also need a second dose.  Pneumococcal 13-valent conjugate (PCV13) vaccine. You may need this if you have certain conditions and were not previously vaccinated.  Pneumococcal polysaccharide (PPSV23) vaccine. You may need one or two doses if you smoke cigarettes or if you have certain conditions.  Meningococcal vaccine. One dose is recommended if you are age 77-21 years and a first-year college student living in a residence hall, or if you have one of several medical conditions. You may also need additional booster doses.  Hepatitis A vaccine. You may need this if you have certain conditions or if you travel or work in places where you may be exposed to hepatitis A.  Hepatitis B vaccine. You may need this if you have certain conditions or if you travel or work in places where  you may be exposed to hepatitis B.  Haemophilus influenzae type b (Hib) vaccine. You may need this if you have certain risk factors.  Talk to your health care provider about which screenings and vaccines you need and how often you need them. This information is not intended to replace advice given to you by your health care provider. Make sure you discuss any questions  you have with your health care provider. Document Released: 10/02/2001 Document Revised: 04/25/2016 Document Reviewed: 06/07/2015 Elsevier Interactive Patient Education  [employer].

## 2017-12-17 NOTE — Assessment & Plan Note (Signed)
Smokes 2.5 cigars daily. Encouraged complete cessation. Discussed need to quit as relates to risk of numerous cancers, cardiac and pulmonary disease as well as neurologic complications. Counseled for greater than 3 minutes

## 2017-12-17 NOTE — Assessment & Plan Note (Addendum)
Patient encouraged to maintain heart healthy diet, regular exercise, adequate sleep. Consider daily probiotics. Take medications as prescribed. Labs ordered today. Follows with Dr Beatrix Shipper of GYN will request pap

## 2018-02-17 ENCOUNTER — Encounter: Payer: Self-pay | Admitting: Family Medicine

## 2018-02-17 ENCOUNTER — Other Ambulatory Visit: Payer: Self-pay

## 2018-02-17 MED ORDER — NITROFURANTOIN MONOHYD MACRO 100 MG PO CAPS: 100.0000 mg | ORAL_CAPSULE | Freq: Two times a day (BID) | ORAL | 0 refills | Status: DC

## 2018-02-17 MED ORDER — FLUCONAZOLE 150 MG PO TABS: 150.0000 mg | ORAL_TABLET | Freq: Once | ORAL | 0 refills | Status: AC

## 2018-03-10 ENCOUNTER — Other Ambulatory Visit (HOSPITAL_COMMUNITY)
Admission: RE | Admit: 2018-03-10 | Discharge: 2018-03-10 | Disposition: A | Discharge status: Home / Self Care | Payer: 59 | Source: Ambulatory Visit | Attending: Family Medicine | Admitting: Family Medicine

## 2018-03-10 ENCOUNTER — Other Ambulatory Visit (INDEPENDENT_AMBULATORY_CARE_PROVIDER_SITE_OTHER): Payer: 59

## 2018-03-10 ENCOUNTER — Encounter: Payer: Self-pay | Admitting: Family Medicine

## 2018-03-10 DIAGNOSIS — Z7251 High risk heterosexual behavior: Secondary | ICD-10-CM | POA: Diagnosis not present

## 2018-03-11 NOTE — Telephone Encounter (Signed)
Completed.

## 2018-03-12 LAB — HIV ANTIBODY (ROUTINE TESTING W REFLEX): HIV 1&2 Ab, 4th Generation: NONREACTIVE

## 2018-03-12 LAB — URINE CYTOLOGY ANCILLARY ONLY
CHLAMYDIA, DNA PROBE: NEGATIVE
NEISSERIA GONORRHEA: NEGATIVE
TRICH (WINDOWPATH): NEGATIVE

## 2018-03-12 LAB — HSV TYPE I/II IGG, IGMW/ REFLEX
HSV 1 Glycoprotein G Ab, IgG: <0.91 index (ref 0.00–0.90)
HSV 1 IgM: <1:10 {titer}
HSV 2 IgG, Type Spec: 6.16 index — ABNORMAL HIGH (ref 0.00–0.90)
HSV 2 IgM: <1:10 {titer}

## 2018-03-13 LAB — URINE CYTOLOGY ANCILLARY ONLY: Candida vaginitis: NEGATIVE

## 2018-03-16 ENCOUNTER — Encounter: Payer: Self-pay | Admitting: Family Medicine

## 2018-03-17 ENCOUNTER — Other Ambulatory Visit: Payer: Self-pay | Admitting: Family Medicine

## 2018-03-17 DIAGNOSIS — R1031 Right lower quadrant pain: Secondary | ICD-10-CM

## 2018-08-07 ENCOUNTER — Encounter: Payer: Self-pay | Admitting: Family Medicine

## 2018-08-07 ENCOUNTER — Telehealth: Payer: 59 | Admitting: Physician Assistant

## 2018-08-07 DIAGNOSIS — H60399 Other infective otitis externa, unspecified ear: Secondary | ICD-10-CM | POA: Diagnosis not present

## 2018-08-07 MED ORDER — NEOMYCIN-POLYMYXIN-HC 3.5-10000-1 OT SOLN: OTIC | 0 refills | Status: DC

## 2018-08-07 NOTE — Progress Notes (Signed)
E Visit for Swimmer's Ear  We are sorry that you are not feeling well. Here is how we plan to help!  Based on what you have shared with me it looks like you have swimmers ear. Swimmer's ear is a redness or swelling, irritation, or infection of your outer ear canal.  These symptoms usually occur within a few days of swimming or getting water lodged in the ear (showering, etc).  Your ear canal is a tube that goes from the opening of the ear to the eardrum.  When water stays in your ear canal, germs can grow.  This is a painful condition that often happens to children and swimmers of all ages.  It is not contagious and oral antibiotics are not required to treat uncomplicated swimmer's ear.  The usual symptoms include: Itching inside the ear, Redness or a sense of swelling in the ear, Pain when the ear is tugged on when pressure is placed on the ear, Pus draining from the infected ear. and I have prescribed: Neomycin 0.35%, polymyxin B 10,000 units/mL, and hydrocortisone 0,5% otic solution 4 drops in affected ears four times a day for 7 days  In certain cases swimmer's ear may progress to a more serious bacterial infection of the middle or inner ear.  If you have a fever 102 and up and significantly worsening symptoms, this could indicate a more serious infection moving to the middle/inner and needs face to face evaluation in an office by a provider.  Your symptoms should improve over the next 3 days and should resolve in about 7 days.  HOME CARE:   Wash your hands frequently.  Do not place the tip of the bottle on your ear or touch it with your fingers.  You can take Acetominophen 650 mg every 4-6 hours as needed for pain.  If pain is severe or moderate, you can apply a heating pad (set on low) or hot water bottle (wrapped in a towel) to outer ear for 20 minutes.  This will also increase drainage.  Avoid ear plugs  Do not use Q-tips  After showers, help the water run out by tilting your head to  one side.  GET HELP RIGHT AWAY IF:   Fever is over 102.2 degrees.  You develop progressive ear pain or hearing loss.  Ear symptoms persist longer than 3 days after treatment.  MAKE SURE YOU:   Understand these instructions.  Will watch your condition.  Will get help right away if you are not doing well or get worse.  TO PREVENT SWIMMER'S EAR:  Use a bathing cap or custom fitted swim molds to keep your ears dry.  Towel off after swimming to dry your ears.  Tilt your head or pull your earlobes to allow the water to escape your ear canal.  If there is still water in your ears, consider using a hairdryer on the lowest setting.  Thank you for choosing an e-visit. Your e-visit answers were reviewed by a board certified advanced clinical practitioner to complete your personal care plan. Depending upon the condition, your plan could have included both over the counter or prescription medications. Please review your pharmacy choice. Be sure that the pharmacy you have chosen is open so that you can pick up your prescription now.  If there is a problem you may message your provider in MyChart to have the prescription routed to another pharmacy. Your safety is important to us. If you have drug allergies check your prescription carefully.  For the next 24 hours, you can use MyChart to ask questions about today's visit, request a non-urgent call back, or ask for a work or school excuse from your e-visit provider. You will get an email in the next two days asking about your experience. I hope that your e-visit has been valuable and will speed your recovery.

## 2018-08-08 MED FILL — NEO/POLYMYXIN/HC EAR SOLN: 3.5-10000-1 | 7 days supply | Qty: 10 | Fill #0

## 2018-11-22 ENCOUNTER — Telehealth: Payer: 59 | Admitting: Physician Assistant

## 2018-11-22 DIAGNOSIS — J029 Acute pharyngitis, unspecified: Secondary | ICD-10-CM | POA: Diagnosis not present

## 2018-11-22 MED ORDER — AMOXICILLIN 875 MG PO TABS: 875.0000 mg | ORAL_TABLET | Freq: Two times a day (BID) | ORAL | 0 refills | Status: DC

## 2018-11-22 NOTE — Progress Notes (Signed)
I have spent 5 minutes in review of e-visit questionnaire, review and updating patient chart, medical decision making and response to patient.   Jenefer Woerner Cody Nathyn Luiz, PA-C    

## 2018-11-22 NOTE — Progress Notes (Signed)
We are sorry that you are not feeling well.  Here is how we plan to help!  Based on what you have shared with me it is likely that you have bacterial pharyngitis.  pharyngitis is inflammation and infection in the back of the throat.  This is an infection cause by bacteria and is treated with antibiotics.  I have prescribed Amoxicillin 875 mg one tablet twice daily for 10 days. For throat pain, we recommend over the counter oral pain relief medications such as acetaminophen or aspirin, or anti-inflammatory medications such as ibuprofen or naproxen sodium. Topical treatments such as oral throat lozenges or sprays may be used as needed. Strep infections are not as easily transmitted as other respiratory infections, however we still recommend that you avoid close contact with loved ones, especially the very young and elderly.  Remember to wash your hands thoroughly throughout the day as this is the number one way to prevent the spread of infection and wipe down door knobs and counters with disinfectant.   Home Care:  Only take medications as instructed by your medical team.  Complete the entire course of an antibiotic.  Do not take these medications with alcohol.  A steam or ultrasonic humidifier can help congestion.  You can place a towel over your head and breathe in the steam from hot water coming from a faucet.  Avoid close contacts especially the very young and the elderly.  Cover your mouth when you cough or sneeze.  Always remember to wash your hands.  Get Help Right Away If:  You develop worsening fever or sinus pain.  You develop a severe head ache or visual changes.  Your symptoms persist after you have completed your treatment plan.  Make sure you  Understand these instructions.  Will watch your condition.  Will get help right away if you are not doing well or get worse.  Your e-visit answers were reviewed by a board certified advanced clinical practitioner to complete  your personal care plan.  Depending on the condition, your plan could have included both over the counter or prescription medications.  If there is a problem please reply  once you have received a response from your provider.  Your safety is important to [place].  If you have drug allergies check your prescription carefully.    You can use MyChart to ask questions about today's visit, request a non-urgent call back, or ask for a work or school excuse for 24 hours related to this e-Visit. If it has been greater than 24 hours you will need to follow up with your provider, or enter a new e-Visit to address those concerns.  You will get an e-mail in the next two days asking about your experience.  I hope that your e-visit has been valuable and will speed your recovery. Thank you for using e-visits.

## 2018-12-02 ENCOUNTER — Emergency Department (HOSPITAL_BASED_OUTPATIENT_CLINIC_OR_DEPARTMENT_OTHER)
Admission: EM | Admit: 2018-12-02 | Discharge: 2018-12-02 | Disposition: A | Discharge status: Home / Self Care | Payer: 59 | Attending: Emergency Medicine | Admitting: Emergency Medicine

## 2018-12-02 ENCOUNTER — Encounter (HOSPITAL_BASED_OUTPATIENT_CLINIC_OR_DEPARTMENT_OTHER): Payer: Self-pay

## 2018-12-02 ENCOUNTER — Other Ambulatory Visit: Payer: Self-pay

## 2018-12-02 DIAGNOSIS — B3731 Acute candidiasis of vulva and vagina: Secondary | ICD-10-CM

## 2018-12-02 DIAGNOSIS — F17210 Nicotine dependence, cigarettes, uncomplicated: Secondary | ICD-10-CM | POA: Insufficient documentation

## 2018-12-02 DIAGNOSIS — B9689 Other specified bacterial agents as the cause of diseases classified elsewhere: Secondary | ICD-10-CM | POA: Diagnosis not present

## 2018-12-02 DIAGNOSIS — N760 Acute vaginitis: Secondary | ICD-10-CM | POA: Diagnosis not present

## 2018-12-02 DIAGNOSIS — R1031 Right lower quadrant pain: Secondary | ICD-10-CM | POA: Diagnosis present

## 2018-12-02 DIAGNOSIS — B373 Candidiasis of vulva and vagina: Secondary | ICD-10-CM | POA: Insufficient documentation

## 2018-12-02 DIAGNOSIS — N898 Other specified noninflammatory disorders of vagina: Secondary | ICD-10-CM

## 2018-12-02 LAB — URINALYSIS, ROUTINE W REFLEX MICROSCOPIC
Bilirubin Urine: NEGATIVE
Glucose, UA: NEGATIVE mg/dL
Ketones, ur: NEGATIVE mg/dL
Nitrite: NEGATIVE
Protein, ur: NEGATIVE mg/dL
Specific Gravity, Urine: 1.025 (ref 1.005–1.030)
pH: 6 (ref 5.0–8.0)

## 2018-12-02 LAB — COMPREHENSIVE METABOLIC PANEL
ALT: 17 U/L (ref 0–44)
AST: 21 U/L (ref 15–41)
Albumin: 3.5 g/dL (ref 3.5–5.0)
Alkaline Phosphatase: 47 U/L (ref 38–126)
Anion gap: 7 (ref 5–15)
BUN: 11 mg/dL (ref 6–20)
CO2: 23 mmol/L (ref 22–32)
Calcium: 8.4 mg/dL — ABNORMAL LOW (ref 8.9–10.3)
Chloride: 103 mmol/L (ref 98–111)
Creatinine, Ser: 0.88 mg/dL (ref 0.44–1.00)
GFR calc Af Amer: >60 mL/min (ref >60)
GFR calc non Af Amer: >60 mL/min (ref >60)
Glucose, Bld: 98 mg/dL (ref 70–99)
Potassium: 3.7 mmol/L (ref 3.5–5.1)
Sodium: 133 mmol/L — ABNORMAL LOW (ref 135–145)
Total Bilirubin: 0.2 mg/dL — ABNORMAL LOW (ref 0.3–1.2)
Total Protein: 7.3 g/dL (ref 6.5–8.1)

## 2018-12-02 LAB — CBC WITH DIFFERENTIAL/PLATELET
Abs Immature Granulocytes: 0.01 10*3/uL (ref 0.00–0.07)
Basophils Absolute: 0 10*3/uL (ref 0.0–0.1)
Basophils Relative: 0 %
Eosinophils Absolute: 0 10*3/uL (ref 0.0–0.5)
Eosinophils Relative: 1 %
HCT: 42.9 % (ref 36.0–46.0)
Hemoglobin: 13.7 g/dL (ref 12.0–15.0)
Immature Granulocytes: 0 %
Lymphocytes Relative: 32 %
Lymphs Abs: 1.6 10*3/uL (ref 0.7–4.0)
MCH: 29.2 pg (ref 26.0–34.0)
MCHC: 31.9 g/dL (ref 30.0–36.0)
MCV: 91.5 fL (ref 80.0–100.0)
Monocytes Absolute: 0.5 10*3/uL (ref 0.1–1.0)
Monocytes Relative: 10 %
Neutro Abs: 2.9 10*3/uL (ref 1.7–7.7)
Neutrophils Relative %: 57 %
Platelets: 213 10*3/uL (ref 150–400)
RBC: 4.69 MIL/uL (ref 3.87–5.11)
RDW: 14.1 % (ref 11.5–15.5)
WBC: 5 10*3/uL (ref 4.0–10.5)
nRBC: 0 % (ref 0.0–0.2)

## 2018-12-02 LAB — URINALYSIS, MICROSCOPIC (REFLEX)

## 2018-12-02 LAB — WET PREP, GENITAL
Sperm: NONE SEEN
Trich, Wet Prep: NONE SEEN

## 2018-12-02 LAB — PREGNANCY, URINE: Preg Test, Ur: NEGATIVE

## 2018-12-02 MED ORDER — AZITHROMYCIN 1 G PO PACK
1.0000 g | PACK | Freq: Once | ORAL | Status: AC
  Administered 2018-12-02: 1 g via ORAL
  Filled 2018-12-02: qty 1

## 2018-12-02 MED ORDER — FLUCONAZOLE 150 MG PO TABS: ORAL_TABLET | ORAL | 0 refills | Status: DC

## 2018-12-02 MED ORDER — CEFTRIAXONE SODIUM 250 MG IJ SOLR
250.0000 mg | Freq: Once | INTRAMUSCULAR | Status: AC
  Administered 2018-12-02: 250 mg via INTRAMUSCULAR
  Filled 2018-12-02: qty 250

## 2018-12-02 MED ORDER — METRONIDAZOLE 500 MG PO TABS: 500.0000 mg | ORAL_TABLET | Freq: Two times a day (BID) | ORAL | 0 refills | Status: DC

## 2018-12-02 MED ORDER — FLUCONAZOLE 100 MG PO TABS
ORAL_TABLET | ORAL | Status: AC
  Administered 2018-12-02: 150 mg
  Filled 2018-12-02: qty 2

## 2018-12-02 MED ORDER — FLUCONAZOLE 50 MG PO TABS: 150.0000 mg | ORAL_TABLET | Freq: Once | ORAL | Status: DC

## 2018-12-02 NOTE — ED Triage Notes (Addendum)
C/o RLQ pain started yesterday-pt concerned pain started the day after having unprotected sex-also v/o vaginal irritation-denies n/v/d-NAD-steady gait

## 2018-12-02 NOTE — Discharge Instructions (Signed)
You have already been treated for gonorrhea and chlamydia today.  You will be called if you test positive.  It is important that you tell all of your sexual partners that they will need to be treated as well, if you test positive.  If you are positive for HIV or syphilis, please make your sexual partners aware and follow-up with health department or your PCP for treatment.  If your vaginal discharge persist, repeat Diflucan dose in 3 days.  Take Flagyl twice daily for your bacterial vaginosis until completed.  Do not drink alcohol within 24 hours of taking this medication, as it will make you very sick.  Regarding your abdominal pain, if you develop any increasing pain, more persistent pain, fever over 100.4, nausea or vomiting, please return the emergency department for reevaluation.

## 2018-12-02 NOTE — ED Provider Notes (Signed)
[facility] EMERGENCY DEPARTMENT Provider Note   CSN: [id number] Arrival date & time: 12/02/18  1643    History   Chief Complaint Chief Complaint  Patient presents with  . Abdominal Pain    HPI Sandy Love is a 38 y.o. female with history of obesity who presents with a 2-day history of intermittent, dull right lower quadrant pain.  This started the day after having unprotected sex with a new partner.  She has also had some vaginal irritation, but denies any vaginal discharge or bleeding.  She is having regular periods.  She reports she has had a vaginal irritation and itching since shaving in the area. Patient does note that she used a new soap this weekend as well. She denies any fevers, chest pain, shortness of breath, nausea, vomiting, diarrhea, urinary symptoms, back pain.  Patient has never had any abdominal surgeries.       HPI  Past Medical History:  Diagnosis Date  . History of miscarriage 09/20/2014   X 3    . Obesity 09/20/2014  . Pedal edema 02/27/2015  . Preventative health care 11/18/2014    Patient Active Problem List   Diagnosis Date Noted  . Tobacco use 12/17/2017  . Pedal edema 02/27/2015  . Preventative health care 11/18/2014  . History of miscarriage 09/20/2014  . Obesity 09/20/2014    Past Surgical History:  Procedure Laterality Date  . DILATION AND CURETTAGE OF UTERUS       OB History    Gravida  4   Para      Term      Preterm      AB  3   Living        SAB  3   TAB      Ectopic      Multiple      Live Births               Home Medications    Prior to Admission medications   Medication Sig Start Date End Date Taking? Authorizing Provider  amoxicillin (AMOXIL) 875 MG tablet Take 1 tablet (875 mg total) by mouth 2 (two) times daily. 11/22/18   Waldon Merl, PA-C  fluconazole (DIFLUCAN) 150 MG tablet Take second dose if your symptoms have not improved in 72 hours. 12/02/18   Malakye Nolden, Waylan Boga, PA-C   metroNIDAZOLE (FLAGYL) 500 MG tablet Take 1 tablet (500 mg total) by mouth 2 (two) times daily. 12/02/18   Emi Holes, PA-C    Family History Family History  Problem Relation Age of Onset  . Diabetes Maternal Grandmother   . Diabetes Paternal Grandmother   . Obesity Sister   . Renal Disease Brother        single kidney  . Dementia Paternal Grandfather   . Obesity Brother   . Cancer Maternal Aunt 69       breast    Social History Social History   Tobacco Use  . Smoking status: Current Every Day Smoker    Types: Cigars, Cigarettes  . Smokeless tobacco: Never Used  Substance Use Topics  . Alcohol use: Yes    Comment: occ  . Drug use: No     Allergies   Patient has no known allergies.   Review of Systems Review of Systems  Constitutional: Negative for chills and fever.  HENT: Negative for facial swelling and sore throat.   Respiratory: Negative for shortness of breath.   Cardiovascular: Negative for chest pain.  Gastrointestinal: Positive for abdominal pain. Negative for nausea and vomiting.  Genitourinary: Positive for vaginal pain (irritation, itching). Negative for dysuria, vaginal bleeding and vaginal discharge.  Musculoskeletal: Negative for back pain.  Skin: Negative for rash and wound.  Neurological: Negative for headaches.  Psychiatric/Behavioral: The patient is not nervous/anxious.      Physical Exam Updated Vital Signs BP (!) 155/98 (BP Location: Left Arm)   Pulse 84   Temp 97.9 F (36.6 C) (Oral)   Resp 20   Wt (!) 136.5 kg   SpO2 98%   BMI 43.17 kg/m   Physical Exam Vitals signs and nursing note reviewed. Exam conducted with a chaperone present.  Constitutional:      General: She is not in acute distress.    Appearance: She is well-developed. She is not diaphoretic.  HENT:     Head: Normocephalic and atraumatic.     Mouth/Throat:     Pharynx: No oropharyngeal exudate.  Eyes:     General: No scleral icterus.       Right eye: No  discharge.        Left eye: No discharge.     Conjunctiva/sclera: Conjunctivae normal.     Pupils: Pupils are equal, round, and reactive to light.  Neck:     Musculoskeletal: Normal range of motion and neck supple.     Thyroid: No thyromegaly.  Cardiovascular:     Rate and Rhythm: Normal rate and regular rhythm.     Heart sounds: Normal heart sounds. No murmur. No friction rub. No gallop.   Pulmonary:     Effort: Pulmonary effort is normal. No respiratory distress.     Breath sounds: Normal breath sounds. No stridor. No wheezing or rales.  Abdominal:     General: Bowel sounds are normal. There is no distension.     Palpations: Abdomen is soft.     Tenderness: There is no abdominal tenderness. There is no right CVA tenderness, left CVA tenderness, guarding or rebound.  Genitourinary:    Labia:        Right: No rash or tenderness.        Left: No rash or tenderness.      Vagina: Vaginal discharge (thick, white, cottage cheese like) present.     Cervix: No cervical motion tenderness.     Adnexa: Right adnexa normal and left adnexa normal.       Right: No tenderness.         Left: No tenderness.       Comments: No signs of skin infection or abscess, some shaving bump Lymphadenopathy:     Cervical: No cervical adenopathy.  Skin:    General: Skin is warm and dry.     Coloration: Skin is not pale.     Findings: No rash.  Neurological:     Mental Status: She is alert.     Coordination: Coordination normal.      ED Treatments / Results  Labs (all labs ordered are listed, but only abnormal results are displayed) Labs Reviewed  WET PREP, GENITAL - Abnormal; Notable for the following components:      Result Value   Yeast Wet Prep HPF POC PRESENT (*)    Clue Cells Wet Prep HPF POC PRESENT (*)    WBC, Wet Prep HPF POC MODERATE (*)    All other components within normal limits  COMPREHENSIVE METABOLIC PANEL - Abnormal; Notable for the following components:   Sodium 133 (*)     Calcium 8.4 (*)  Total Bilirubin 0.2 (*)    All other components within normal limits  URINALYSIS, ROUTINE W REFLEX MICROSCOPIC - Abnormal; Notable for the following components:   Hgb urine dipstick TRACE (*)    Leukocytes,Ua TRACE (*)    All other components within normal limits  URINALYSIS, MICROSCOPIC (REFLEX) - Abnormal; Notable for the following components:   Bacteria, UA FEW (*)    All other components within normal limits  URINE CULTURE  CBC WITH DIFFERENTIAL/PLATELET  PREGNANCY, URINE  RPR  HIV ANTIBODY (ROUTINE TESTING W REFLEX)  GC/CHLAMYDIA PROBE AMP ([place]) NOT AT [facility]    EKG None  Radiology No results found.  Procedures Procedures (including critical care time)  Medications Ordered in ED Medications  cefTRIAXone (ROCEPHIN) injection 250 mg (has no administration in time range)  azithromycin (ZITHROMAX) powder 1 g (has no administration in time range)  fluconazole (DIFLUCAN) tablet 150 mg (has no administration in time range)     Initial Impression / Assessment and Plan / ED Course  I have reviewed the triage vital signs and the nursing notes.  Pertinent labs & imaging results that were available during my care of the patient were reviewed by me and considered in my medical decision making (see chart for details).        Patient presenting with vaginal irritation and intermittent, very dull right lower quadrant pain.  Patient has no abdominal or adnexal tenderness.  She does not have a surgical abdomen.  WBC is within normal limits.  CMP is also within normal limits.  Wet prep shows yeast and clue cells.  Will treat for BV and yeast.  Patient also opted for prophylactic treatment for GC and chlamydia, which is pending.  HIV and syphilis is pending as well.  Using shared decision making, I discussed the possibility of appendicitis, however very low suspicion considering intermittent, mild pain and no tenderness on exam.  Patient opted for strict return  precautions and agrees with no imaging today.  The UA has trace leukocytes and few bacteria, will send for culture and will treat if positive considering some dull pain.  Urine pregnancy negative.  As above, strict return precautions given including persistent, worsening right lower quadrant pain, fevers, nausea or vomiting, or any other concerning symptoms.  Patient understands and agrees with plan.  Patient vitals stable throughout ED course and discharged in satisfactory condition.  Final Clinical Impressions(s) / ED Diagnoses   Final diagnoses:  Right lower quadrant abdominal pain  Vaginal irritation  Vaginal yeast infection  BV (bacterial vaginosis)    ED Discharge Orders         Ordered    fluconazole (DIFLUCAN) 150 MG tablet     12/02/18 1900    metroNIDAZOLE (FLAGYL) 500 MG tablet  2 times daily     12/02/18 1900           Joanie Duprey, Waylan Boga, PA-C 12/02/18 Benjamine Sprague, MD 12/02/18 2103

## 2018-12-02 NOTE — ED Notes (Signed)
Diflucan 50mg  was not available in Pyxis; Diflucan 100mg  tab x 2 was dispensed and 50mg  was wasted. Pt received 150mg  po.

## 2018-12-03 LAB — GC/CHLAMYDIA PROBE AMP (~~LOC~~) NOT AT ARMC
Chlamydia: NEGATIVE
Neisseria Gonorrhea: NEGATIVE

## 2018-12-03 LAB — HIV ANTIBODY (ROUTINE TESTING W REFLEX): HIV Screen 4th Generation wRfx: NONREACTIVE

## 2018-12-03 LAB — RPR: RPR Ser Ql: NONREACTIVE

## 2018-12-04 LAB — URINE CULTURE: Culture: 80000 — AB

## 2018-12-05 ENCOUNTER — Telehealth: Payer: Self-pay | Admitting: *Deleted

## 2018-12-05 NOTE — Progress Notes (Signed)
ED Antimicrobial Stewardship Positive Culture Follow Up   Sandy Love is an 38 y.o. female who presented to [facility] on 12/02/2018 with a chief complaint of vaginal irritation and dull RLQ pain.  Chief Complaint  Patient presents with  . Abdominal Pain    Recent Results (from the past 720 hour(s))  Urine culture     Status: Abnormal   Collection Time: 12/02/18  5:32 PM  Result Value Ref Range Status   Specimen Description   Final    URINE, RANDOM Performed at [facility], [address] [place], [place] [address]    Special Requests   Final    NONE Performed at [facility], [address] [place], [place] [address]    Culture 80,000 COLONIES/mL ESCHERICHIA COLI (A)  Final   Report Status 12/04/2018 FINAL  Final   Organism ID, Bacteria ESCHERICHIA COLI (A)  Final      Susceptibility   Escherichia coli - MIC*    AMPICILLIN >=32 RESISTANT Resistant     CEFAZOLIN <=4 SENSITIVE Sensitive     CEFTRIAXONE <=1 SENSITIVE Sensitive     CIPROFLOXACIN <=0.25 SENSITIVE Sensitive     GENTAMICIN <=1 SENSITIVE Sensitive     IMIPENEM <=0.25 SENSITIVE Sensitive     NITROFURANTOIN <=16 SENSITIVE Sensitive     TRIMETH/SULFA <=20 SENSITIVE Sensitive     AMPICILLIN/SULBACTAM >=32 RESISTANT Resistant     PIP/TAZO <=4 SENSITIVE Sensitive     Extended ESBL NEGATIVE Sensitive     * 80,000 COLONIES/mL ESCHERICHIA COLI  Wet prep, genital     Status: Abnormal   Collection Time: 12/02/18  6:17 PM  Result Value Ref Range Status   Yeast Wet Prep HPF POC PRESENT (A) NONE SEEN Final   Trich, Wet Prep NONE SEEN NONE SEEN Final   Clue Cells Wet Prep HPF POC PRESENT (A) NONE SEEN Final   WBC, Wet Prep HPF POC MODERATE (A) NONE SEEN Final   Sperm NONE SEEN  Final    Comment: Performed at [facility], [address] [place], [place] [address]    Treated for BV and yeast with Flagyl and fluconazole, RN to call pt and if symptoms of RLQ worsened or new urinary  symptoms treat with abx as below; If not do not treat.    New antibiotic prescription: Keflex 500mg  PO TID x 5d  ED Provider: Leonia Corona, PA-C   [name] 12/05/2018, 8:24 AM Clinical Pharmacist Monday - Friday phone -  [phone] Saturday - Sunday phone - [phone]

## 2018-12-05 NOTE — Telephone Encounter (Signed)
Post ED Visit - Positive Culture Follow-up: Unsuccessful Patient Follow-up  Culture assessed and recommendations reviewed by:  []  Enzo Bi, Pharm.D. []  Celedonio Miyamoto, Pharm.D., BCPS AQ-ID []  Garvin Fila, Pharm.D., BCPS []  Georgina Pillion, Pharm.D., BCPS []  [place], [place].D., BCPS, AAHIVP []  Estella Husk, Pharm.D., BCPS, AAHIVP []  Sherlynn Carbon, PharmD []  Pollyann Samples, PharmD, BCPS  Positive urine culture  []  Patient discharged without antimicrobial prescription and treatment is now indicated []  Organism is resistant to prescribed ED discharge antimicrobial []  Patient with positive blood cultures Plan:  If asymptomatic do not treat, if symptomatic give Keflex 500mg  TID s 5 days  Unable to contact patient after 3 attempts, letter will be sent to address on file  Lysle Pearl 12/05/2018, 8:46 AM

## 2019-01-06 ENCOUNTER — Encounter: Payer: 59 | Admitting: Family Medicine

## 2019-01-07 ENCOUNTER — Telehealth: Payer: Self-pay | Admitting: Emergency Medicine

## 2019-01-07 NOTE — Telephone Encounter (Signed)
Patient called in response to letter states asymptomatic, also has been greater than 30 days

## 2019-01-09 DIAGNOSIS — H5211 Myopia, right eye: Secondary | ICD-10-CM | POA: Diagnosis not present

## 2019-01-09 DIAGNOSIS — H52222 Regular astigmatism, left eye: Secondary | ICD-10-CM | POA: Diagnosis not present

## 2019-01-09 DIAGNOSIS — H52221 Regular astigmatism, right eye: Secondary | ICD-10-CM | POA: Diagnosis not present

## 2019-01-09 DIAGNOSIS — H5202 Hypermetropia, left eye: Secondary | ICD-10-CM | POA: Diagnosis not present

## 2019-01-13 ENCOUNTER — Encounter: Payer: Self-pay | Admitting: Family Medicine

## 2019-01-13 ENCOUNTER — Other Ambulatory Visit: Payer: Self-pay

## 2019-01-13 DIAGNOSIS — R109 Unspecified abdominal pain: Secondary | ICD-10-CM

## 2019-01-13 DIAGNOSIS — Z3201 Encounter for pregnancy test, result positive: Secondary | ICD-10-CM

## 2019-01-13 MED ORDER — PRENATAL/FOLIC ACID PO TABS: 1.0000 | ORAL_TABLET | Freq: Every day | ORAL | 1 refills | Status: DC

## 2019-01-13 NOTE — Addendum Note (Signed)
Addended by: Crissie Sickles A on: 01/13/2019 02:03 PM   Modules accepted: Orders

## 2019-01-13 NOTE — Addendum Note (Signed)
Addended by: Crissie Sickles A on: 01/13/2019 02:18 PM   Modules accepted: Orders

## 2019-01-14 ENCOUNTER — Ambulatory Visit (INDEPENDENT_AMBULATORY_CARE_PROVIDER_SITE_OTHER): Payer: 59 | Admitting: Family Medicine

## 2019-01-14 ENCOUNTER — Other Ambulatory Visit: Payer: Self-pay

## 2019-01-14 ENCOUNTER — Other Ambulatory Visit (INDEPENDENT_AMBULATORY_CARE_PROVIDER_SITE_OTHER): Payer: 59

## 2019-01-14 DIAGNOSIS — Z3201 Encounter for pregnancy test, result positive: Secondary | ICD-10-CM | POA: Diagnosis not present

## 2019-01-14 DIAGNOSIS — Z3490 Encounter for supervision of normal pregnancy, unspecified, unspecified trimester: Secondary | ICD-10-CM | POA: Diagnosis not present

## 2019-01-14 DIAGNOSIS — M533 Sacrococcygeal disorders, not elsewhere classified: Secondary | ICD-10-CM | POA: Diagnosis not present

## 2019-01-14 DIAGNOSIS — R109 Unspecified abdominal pain: Secondary | ICD-10-CM | POA: Diagnosis not present

## 2019-01-14 LAB — CBC WITH DIFFERENTIAL/PLATELET
Basophils Absolute: 0 10*3/uL (ref 0.0–0.1)
Basophils Relative: 0.8 % (ref 0.0–3.0)
Eosinophils Absolute: 0.1 10*3/uL (ref 0.0–0.7)
Eosinophils Relative: 1.7 % (ref 0.0–5.0)
HCT: 41.4 % (ref 36.0–46.0)
Hemoglobin: 14.2 g/dL (ref 12.0–15.0)
Lymphocytes Relative: 25.6 % (ref 12.0–46.0)
Lymphs Abs: 1.7 10*3/uL (ref 0.7–4.0)
MCHC: 34.3 g/dL (ref 30.0–36.0)
MCV: 89.1 fl (ref 78.0–100.0)
Monocytes Absolute: 0.5 10*3/uL (ref 0.1–1.0)
Monocytes Relative: 8.2 % (ref 3.0–12.0)
Neutro Abs: 4.2 10*3/uL (ref 1.4–7.7)
Neutrophils Relative %: 63.7 % (ref 43.0–77.0)
Platelets: 203 10*3/uL (ref 150.0–400.0)
RBC: 4.65 Mil/uL (ref 3.87–5.11)
RDW: 14.5 % (ref 11.5–15.5)
WBC: 6.6 10*3/uL (ref 4.0–10.5)

## 2019-01-14 LAB — COMPREHENSIVE METABOLIC PANEL
ALT: 14 U/L (ref 0–35)
AST: 11 U/L (ref 0–37)
Albumin: 3.6 g/dL (ref 3.5–5.2)
Alkaline Phosphatase: 47 U/L (ref 39–117)
BUN: 9 mg/dL (ref 6–23)
CO2: 24 mEq/L (ref 19–32)
Calcium: 9.1 mg/dL (ref 8.4–10.5)
Chloride: 100 mEq/L (ref 96–112)
Creatinine, Ser: 0.8 mg/dL (ref 0.40–1.20)
GFR: 97.58 mL/min (ref >60.00)
Glucose, Bld: 110 mg/dL — ABNORMAL HIGH (ref 70–99)
Potassium: 4.2 mEq/L (ref 3.5–5.1)
Sodium: 133 mEq/L — ABNORMAL LOW (ref 135–145)
Total Bilirubin: 0.4 mg/dL (ref 0.2–1.2)
Total Protein: 7.1 g/dL (ref 6.0–8.3)

## 2019-01-14 LAB — HCG, QUANTITATIVE, PREGNANCY: Quantitative HCG: 54293 m[IU]/mL

## 2019-01-14 NOTE — Assessment & Plan Note (Signed)
Is working on getting set up with her obstetrician, Dr Chauncey Reading she had to pay an outstanding bill first. We will start her on a prenatal vitamin and check labs including UA and culture, blood work and she will seek care if she develops new symptoms

## 2019-01-14 NOTE — Assessment & Plan Note (Addendum)
Left hip. Pain started after exercising last week and is intermittent. It is gone completely when she sits or lies down quiet but if she moves even slightly sometimes. Stands up etc she has a sharp stabbing pain in her posterior left hip. Encouraged moist heat and Lidocaine patches qhs. Tylenol 500 mg po bid. Pain is improved from yesterday so no other changes but she will let us know if worsens again

## 2019-01-14 NOTE — Progress Notes (Signed)
Virtual Visit via Video Note  I connected with Sandy Gaultiffany M Abella on 01/14/19 at 10:00 AM EDT by a video enabled telemedicine application and verified that I am speaking with the correct person using two identifiers.  Location: Patient: car Provider: home   I discussed the limitations of evaluation and management by telemedicine and the availability of in person appointments. The patient expressed understanding and agreed to proceed. Crissie SicklesPrincess Carter, CMA was able to get patient set up on video platform.     Subjective:    Patient ID: Sandy Love, female    DOB: 04/17/1981, 38 y.o.   MRN: [id number]  No chief complaint on file.   HPI Patient is in today for evaluation of hip pain and pregnancy. She is working on getting in with her obstetrician and is early in her first trimester. She does have a history of miscarriage so is taking it easy and working on getting set up with her OB. She was exercising late last week and has had trouble with intermittent posterior hip pain on left since then. Pain started after exercising last week and is intermittent. It is gone completely when she sits or lies down quiet but if she moves even slightly sometimes. Stands up etc she has a sharp stabbing pain in her posterior left hip. She denies any vaginal discharge, lesions or pain. No fevers or chills, no myalgias or abdominal pain. Denies CP/palp/SOB/HA/congestion/fevers/GI or GU c/o. Taking meds as prescribed  Past Medical History:  Diagnosis Date  . History of miscarriage 09/20/2014   X 3    . Obesity 09/20/2014  . Pedal edema 02/27/2015  . Preventative health care 11/18/2014    Past Surgical History:  Procedure Laterality Date  . DILATION AND CURETTAGE OF UTERUS      Family History  Problem Relation Age of Onset  . Diabetes Maternal Grandmother   . Diabetes Paternal Grandmother   . Obesity Sister   . Renal Disease Brother        single kidney  . Dementia Paternal Grandfather   . Obesity Brother    . Cancer Maternal Aunt [id number]       breast    Social History   Socioeconomic History  . Marital status: Single    Spouse name: Not on file  . Number of children: Not on file  . Years of education: Not on file  . Highest education level: Not on file  Occupational History  . Not on file  Social Needs  . Financial resource strain: Not on file  . Food insecurity:    Worry: Not on file    Inability: Not on file  . Transportation needs:    Medical: Not on file    Non-medical: Not on file  Tobacco Use  . Smoking status: Current Every Day Smoker    Types: Cigars, Cigarettes  . Smokeless tobacco: Never Used  Substance and Sexual Activity  . Alcohol use: Yes    Comment: occ  . Drug use: No  . Sexual activity: Yes    Birth control/protection: None  Lifestyle  . Physical activity:    Days per week: Not on file    Minutes per session: Not on file  . Stress: Not on file  Relationships  . Social connections:    Talks on phone: Not on file    Gets together: Not on file    Attends religious service: Not on file    Active member of club or organization: Not on file  Attends meetings of clubs or organizations: Not on file    Relationship status: Not on file  . Intimate partner violence:    Fear of current or ex partner: Not on file    Emotionally abused: Not on file    Physically abused: Not on file    Forced sexual activity: Not on file  Other Topics Concern  . Not on file  Social History Narrative  . Not on file    Outpatient Medications Prior to Visit  Medication Sig Dispense Refill  . amoxicillin (AMOXIL) 875 MG tablet Take 1 tablet (875 mg total) by mouth 2 (two) times daily. 20 tablet 0  . fluconazole (DIFLUCAN) 150 MG tablet Take second dose if your symptoms have not improved in 72 hours. 1 tablet 0  . metroNIDAZOLE (FLAGYL) 500 MG tablet Take 1 tablet (500 mg total) by mouth 2 (two) times daily. 14 tablet 0  . Prenatal Vit-Fe Fumarate-FA (PRENATAL/FOLIC ACID) TABS  Take 1 tablet by mouth daily. 90 tablet 1   No facility-administered medications prior to visit.     No Known Allergies  Review of Systems  Constitutional: Positive for malaise/fatigue. Negative for fever.  HENT: Negative for congestion.   Eyes: Negative for blurred vision.  Respiratory: Negative for shortness of breath.   Cardiovascular: Negative for chest pain, palpitations and leg swelling.  Gastrointestinal: Negative for abdominal pain, blood in stool and nausea.  Genitourinary: Negative for dysuria and frequency.  Musculoskeletal: Positive for joint pain. Negative for falls.  Skin: Negative for rash.  Neurological: Negative for dizziness, loss of consciousness and headaches.  Endo/Heme/Allergies: Negative for environmental allergies.  Psychiatric/Behavioral: Negative for depression. The patient is not nervous/anxious.        Objective:    Physical Exam Constitutional:      Appearance: Normal appearance. She is not ill-appearing.  HENT:     Nose: Nose normal.  Eyes:     General:        Right eye: No discharge.        Left eye: No discharge.  Pulmonary:     Effort: Pulmonary effort is normal.  Neurological:     Mental Status: She is alert and oriented to person, place, and time.  Psychiatric:        Mood and Affect: Mood normal.        Behavior: Behavior normal.     Wt (!) 301 lb (136.5 kg)   BMI 43.19 kg/m  Wt Readings from Last 3 Encounters:  01/14/19 (!) 301 lb (136.5 kg)  12/02/18 (!) 300 lb 14.4 oz (136.5 kg)  12/17/17 (!) 303 lb 3.2 oz (137.5 kg)    Diabetic Foot Exam - Simple   No data filed     Lab Results  Component Value Date   WBC 6.6 01/14/2019   HGB 14.2 01/14/2019   HCT 41.4 01/14/2019   PLT 203.0 01/14/2019   GLUCOSE 110 (H) 01/14/2019   CHOL 181 12/17/2017   TRIG 81.0 12/17/2017   HDL 47.70 12/17/2017   LDLCALC 117 (H) 12/17/2017   ALT 14 01/14/2019   AST 11 01/14/2019   NA 133 (L) 01/14/2019   K 4.2 01/14/2019   CL 100  01/14/2019   CREATININE 0.80 01/14/2019   BUN 9 01/14/2019   CO2 24 01/14/2019   TSH 4.40 12/17/2017    Lab Results  Component Value Date   TSH 4.40 12/17/2017   Lab Results  Component Value Date   WBC 6.6 01/14/2019   HGB  14.2 01/14/2019   HCT 41.4 01/14/2019   MCV 89.1 01/14/2019   PLT 203.0 01/14/2019   Lab Results  Component Value Date   NA 133 (L) 01/14/2019   K 4.2 01/14/2019   CO2 24 01/14/2019   GLUCOSE 110 (H) 01/14/2019   BUN 9 01/14/2019   CREATININE 0.80 01/14/2019   BILITOT 0.4 01/14/2019   ALKPHOS 47 01/14/2019   AST 11 01/14/2019   ALT 14 01/14/2019   PROT 7.1 01/14/2019   ALBUMIN 3.6 01/14/2019   CALCIUM 9.1 01/14/2019   ANIONGAP 7 12/02/2018   GFR 97.58 01/14/2019   Lab Results  Component Value Date   CHOL 181 12/17/2017   Lab Results  Component Value Date   HDL 47.70 12/17/2017   Lab Results  Component Value Date   LDLCALC 117 (H) 12/17/2017   Lab Results  Component Value Date   TRIG 81.0 12/17/2017   Lab Results  Component Value Date   CHOLHDL 4 12/17/2017   No results found for: HGBA1C     Assessment & Plan:   Problem List Items Addressed This Visit    Pregnancy    Is working on getting set up with her obstetrician, Dr Chauncey Reading she had to pay an outstanding bill first. We will start her on a prenatal vitamin and check labs including UA and culture, blood work and she will seek care if she develops new symptoms      Sacroiliac pain    Left hip. Pain started after exercising last week and is intermittent. It is gone completely when she sits or lies down quiet but if she moves even slightly sometimes. Stands up etc she has a sharp stabbing pain in her posterior left hip. Encouraged moist heat and Lidocaine patches qhs. Tylenol 500 mg po bid. Pain is improved from yesterday so no other changes but she will let us know if worsens again         I am having Asa M. Hypolite maintain her amoxicillin, fluconazole, metroNIDAZOLE, and  Prenatal/Folic Acid.  No orders of the defined types were placed in this encounter.   I discussed the assessment and treatment plan with the patient. The patient was provided an opportunity to ask questions and all were answered. The patient agreed with the plan and demonstrated an understanding of the instructions.   The patient was advised to call back or seek an in-person evaluation if the symptoms worsen or if the condition fails to improve as anticipated.  I provided 20 minutes of non-face-to-face time during this encounter.   Danise Edge, MD

## 2019-01-16 ENCOUNTER — Other Ambulatory Visit (INDEPENDENT_AMBULATORY_CARE_PROVIDER_SITE_OTHER): Payer: 59

## 2019-01-16 ENCOUNTER — Encounter: Payer: Self-pay | Admitting: Family Medicine

## 2019-01-16 DIAGNOSIS — R739 Hyperglycemia, unspecified: Secondary | ICD-10-CM | POA: Diagnosis not present

## 2019-01-16 LAB — URINE CULTURE
MICRO NUMBER:: 509902
SPECIMEN QUALITY:: ADEQUATE

## 2019-01-16 LAB — HEMOGLOBIN A1C: Hgb A1c MFr Bld: 6.2 % (ref 4.6–6.5)

## 2019-01-16 MED ORDER — AMOXICILLIN 500 MG PO CAPS: 500.0000 mg | ORAL_CAPSULE | Freq: Three times a day (TID) | ORAL | 0 refills | Status: AC

## 2019-01-16 NOTE — Telephone Encounter (Signed)
Mychart message sent to pt as below:  Notes recorded by Bradd Canary, MD on 01/16/2019 at 12:09 PM EDT Notify she has strep in her urine start her on Amoxicillin 500 mg po tid x 7 days and a probiotic

## 2019-01-21 ENCOUNTER — Other Ambulatory Visit: Payer: Self-pay | Admitting: Obstetrics and Gynecology

## 2019-01-22 DIAGNOSIS — Z3201 Encounter for pregnancy test, result positive: Secondary | ICD-10-CM | POA: Diagnosis not present

## 2019-01-22 DIAGNOSIS — R8271 Bacteriuria: Secondary | ICD-10-CM | POA: Diagnosis not present

## 2019-02-03 DIAGNOSIS — H18612 Keratoconus, stable, left eye: Secondary | ICD-10-CM | POA: Diagnosis not present

## 2019-02-03 DIAGNOSIS — H18621 Keratoconus, unstable, right eye: Secondary | ICD-10-CM | POA: Diagnosis not present

## 2019-02-12 DIAGNOSIS — Z3689 Encounter for other specified antenatal screening: Secondary | ICD-10-CM | POA: Diagnosis not present

## 2019-02-12 DIAGNOSIS — Z36 Encounter for antenatal screening for chromosomal anomalies: Secondary | ICD-10-CM | POA: Diagnosis not present

## 2019-02-12 DIAGNOSIS — O09521 Supervision of elderly multigravida, first trimester: Secondary | ICD-10-CM | POA: Diagnosis not present

## 2019-03-11 DIAGNOSIS — O09522 Supervision of elderly multigravida, second trimester: Secondary | ICD-10-CM | POA: Diagnosis not present

## 2019-03-11 DIAGNOSIS — Z361 Encounter for antenatal screening for raised alphafetoprotein level: Secondary | ICD-10-CM | POA: Diagnosis not present

## 2019-03-11 DIAGNOSIS — Z3686 Encounter for antenatal screening for cervical length: Secondary | ICD-10-CM | POA: Diagnosis not present

## 2019-03-30 MED FILL — VITAFOL GUMMIES: 3.33-0.333- | 30 days supply | Qty: 90 | Fill #0

## 2019-04-03 DIAGNOSIS — O09522 Supervision of elderly multigravida, second trimester: Secondary | ICD-10-CM | POA: Diagnosis not present

## 2019-04-03 DIAGNOSIS — Z3A19 19 weeks gestation of pregnancy: Secondary | ICD-10-CM | POA: Diagnosis not present

## 2019-04-03 MED FILL — PROGESTERONE 200 MG CAPSULE: 200 | 30 days supply | Qty: 30 | Fill #0

## 2019-04-09 ENCOUNTER — Other Ambulatory Visit (HOSPITAL_COMMUNITY): Payer: Self-pay | Admitting: *Deleted

## 2019-04-09 ENCOUNTER — Ambulatory Visit (HOSPITAL_COMMUNITY): Payer: 59 | Admitting: *Deleted

## 2019-04-09 ENCOUNTER — Other Ambulatory Visit (HOSPITAL_COMMUNITY): Payer: Self-pay | Admitting: Obstetrics and Gynecology

## 2019-04-09 ENCOUNTER — Ambulatory Visit (HOSPITAL_COMMUNITY)
Admission: RE | Admit: 2019-04-09 | Discharge: 2019-04-09 | Disposition: A | Discharge status: Home / Self Care | Payer: 59 | Source: Ambulatory Visit | Attending: Obstetrics and Gynecology | Admitting: Obstetrics and Gynecology

## 2019-04-09 ENCOUNTER — Ambulatory Visit (HOSPITAL_COMMUNITY): Payer: 59

## 2019-04-09 ENCOUNTER — Encounter (HOSPITAL_COMMUNITY): Payer: Self-pay

## 2019-04-09 ENCOUNTER — Other Ambulatory Visit: Payer: Self-pay

## 2019-04-09 VITALS — BP 138/75 | HR 110 | Temp 99.2°F | Wt 321.6 lb

## 2019-04-09 DIAGNOSIS — Z3A20 20 weeks gestation of pregnancy: Secondary | ICD-10-CM

## 2019-04-09 DIAGNOSIS — O2622 Pregnancy care for patient with recurrent pregnancy loss, second trimester: Secondary | ICD-10-CM | POA: Diagnosis not present

## 2019-04-09 DIAGNOSIS — O99212 Obesity complicating pregnancy, second trimester: Secondary | ICD-10-CM | POA: Diagnosis not present

## 2019-04-09 DIAGNOSIS — O3432 Maternal care for cervical incompetence, second trimester: Secondary | ICD-10-CM

## 2019-04-09 DIAGNOSIS — O26872 Cervical shortening, second trimester: Secondary | ICD-10-CM | POA: Insufficient documentation

## 2019-04-09 DIAGNOSIS — Z3686 Encounter for antenatal screening for cervical length: Secondary | ICD-10-CM

## 2019-04-09 DIAGNOSIS — O09522 Supervision of elderly multigravida, second trimester: Secondary | ICD-10-CM | POA: Diagnosis not present

## 2019-04-09 DIAGNOSIS — O09521 Supervision of elderly multigravida, first trimester: Secondary | ICD-10-CM | POA: Diagnosis not present

## 2019-04-09 DIAGNOSIS — O289 Unspecified abnormal findings on antenatal screening of mother: Secondary | ICD-10-CM | POA: Diagnosis not present

## 2019-04-09 DIAGNOSIS — N883 Incompetence of cervix uteri: Secondary | ICD-10-CM

## 2019-04-09 NOTE — Consult Note (Signed)
Maternal-Fetal Medicine  Name: Sandy Love MRN: [id number] Requesting Provider: Dr. Brien Few, MD  I had the pleasure of seeing [facility] at [facility]. She is G4 P0 at 20w 1d gestation and is here for consultation because of her diagnosis of cervical incompetence.  On your office ultrasound performed last week, the cervical length measurement was 2 centimeters. Patient was advised to take vaginal progesterone last week. On today's ultrasound at your office, the cervical length measured 7 millimeters with funneling. She does not have symptoms of pelvic pressure or abdominal cramping or vaginal bleeding. She has been taking vaginal progesterone regularly.  Her prenatal course has, otherwise, been uneventful. On cell-free fetal DNA screening, the risks of aneuploidies were not increased. MSAFP screening showed low risk for open-neural tube defects. She had fetal anatomy scan at your office that was reported as normal.  Obstetric history is significant for 3 early spontaneous miscarriages. Gyn: No history of abnormal Pap smears or cervical surgeries. No history of breast disease.  PMH: No history of hypertension or diabetes or any chronic medical conditions. She does not have sickle-cell trait. PSH: D&C. Allergies: NKDA. Medications: Prenatal vitamins, vaginal progesterone. Social: Denies tobacco or drug or alcohol use. Her partner is in good health. Family: No history of venous thromboembolism in the family.  A limited ultrasound study was performed. Amniotic fluid is normal and good fetal activity is seen. We performed transvaginal ultrasound to evaluate the cervix. The shortest cervical length measurement is 1.5 cm. An echolucent area in the cervical canal gives an appearance of funneling, but the internal os is clearly seen.  I counseled the patient on the following:  Cervical shortening:  I discussed the findings with help of ultrasound images and  diagrams.  Studies have shown that in women with no history of preterm birth and a current short cervical length, treatment vaginal progesterone (micronized progesterone 200 milligrams daily) leads to a significant reduction of spontaneous preterm birth before 34 weeks.  Alternative treatment would be cervical cerclage.  However, cerclage in women without a history of preterm birth and short cervix has not shown a statistical reduction in preterm delivery rates. I discussed the procedure and its possible complications.  I informed her that cerclage may be a better option if she is likely to be noncompliant with vaginal progesterone. Patient understands the importance of daily administration of vaginal progesterone and informed me that she is usually very compliant with medications.  I recommended that she continue taking vaginal progesterone. We discussed symptoms of pelvic pressure or vaginal bleeding that should prompt her to call your office.  If repeat cervical length measurement is less than 10 millimeters in 2 weeks, cerclage may be a better option. Cerclage is likely to prolong the pregnancy with better neonatal outcomes.   I informed her that cerclage or progesterone treatment does not guarantee carrying the pregnancy to term.  Recommendations: -An appointment was made for her to return in 2 weeks for cervical length measurement. -Continue vaginal progesterone 200 milligrams daily till 36 weeks.  Thank you for your consult. Please do not hesitate to contact me if you have any questions or concerns.  Consultation including face-to-face counseling: 40 min.

## 2019-04-14 DIAGNOSIS — O3432 Maternal care for cervical incompetence, second trimester: Secondary | ICD-10-CM | POA: Diagnosis not present

## 2019-04-14 DIAGNOSIS — Z3A20 20 weeks gestation of pregnancy: Secondary | ICD-10-CM | POA: Diagnosis not present

## 2019-04-20 DIAGNOSIS — Z3685 Encounter for antenatal screening for Streptococcus B: Secondary | ICD-10-CM | POA: Diagnosis not present

## 2019-04-20 DIAGNOSIS — Z3A21 21 weeks gestation of pregnancy: Secondary | ICD-10-CM | POA: Diagnosis not present

## 2019-04-20 DIAGNOSIS — O3432 Maternal care for cervical incompetence, second trimester: Secondary | ICD-10-CM | POA: Diagnosis not present

## 2019-04-24 ENCOUNTER — Encounter (HOSPITAL_COMMUNITY): Payer: Self-pay

## 2019-04-24 ENCOUNTER — Ambulatory Visit (HOSPITAL_BASED_OUTPATIENT_CLINIC_OR_DEPARTMENT_OTHER)
Admission: RE | Admit: 2019-04-24 | Discharge: 2019-04-24 | Disposition: A | Discharge status: Home / Self Care | Payer: 59 | Source: Ambulatory Visit | Attending: Obstetrics and Gynecology | Admitting: Obstetrics and Gynecology

## 2019-04-24 ENCOUNTER — Inpatient Hospital Stay (HOSPITAL_COMMUNITY)
Admission: AD | Admit: 2019-04-24 | Discharge: 2019-04-26 | DRG: 819 | Disposition: A | Discharge status: Home / Self Care | Payer: 59 | Attending: Obstetrics and Gynecology | Admitting: Obstetrics and Gynecology

## 2019-04-24 ENCOUNTER — Other Ambulatory Visit: Payer: Self-pay

## 2019-04-24 ENCOUNTER — Other Ambulatory Visit (HOSPITAL_COMMUNITY): Payer: Self-pay | Admitting: *Deleted

## 2019-04-24 ENCOUNTER — Ambulatory Visit (HOSPITAL_COMMUNITY): Payer: 59 | Admitting: *Deleted

## 2019-04-24 VITALS — BP 148/71 | HR 100 | Temp 98.0°F

## 2019-04-24 DIAGNOSIS — O99820 Streptococcus B carrier state complicating pregnancy: Secondary | ICD-10-CM | POA: Diagnosis present

## 2019-04-24 DIAGNOSIS — O99212 Obesity complicating pregnancy, second trimester: Secondary | ICD-10-CM

## 2019-04-24 DIAGNOSIS — Z20828 Contact with and (suspected) exposure to other viral communicable diseases: Secondary | ICD-10-CM | POA: Diagnosis present

## 2019-04-24 DIAGNOSIS — O2622 Pregnancy care for patient with recurrent pregnancy loss, second trimester: Secondary | ICD-10-CM

## 2019-04-24 DIAGNOSIS — O26872 Cervical shortening, second trimester: Secondary | ICD-10-CM | POA: Diagnosis present

## 2019-04-24 DIAGNOSIS — O3432 Maternal care for cervical incompetence, second trimester: Principal | ICD-10-CM | POA: Diagnosis present

## 2019-04-24 DIAGNOSIS — N883 Incompetence of cervix uteri: Secondary | ICD-10-CM

## 2019-04-24 DIAGNOSIS — Z3A22 22 weeks gestation of pregnancy: Secondary | ICD-10-CM | POA: Diagnosis not present

## 2019-04-24 DIAGNOSIS — O09522 Supervision of elderly multigravida, second trimester: Secondary | ICD-10-CM

## 2019-04-24 DIAGNOSIS — Z87891 Personal history of nicotine dependence: Secondary | ICD-10-CM

## 2019-04-24 DIAGNOSIS — O0990 Supervision of high risk pregnancy, unspecified, unspecified trimester: Secondary | ICD-10-CM

## 2019-04-24 DIAGNOSIS — O3430 Maternal care for cervical incompetence, unspecified trimester: Secondary | ICD-10-CM

## 2019-04-24 DIAGNOSIS — O289 Unspecified abnormal findings on antenatal screening of mother: Secondary | ICD-10-CM | POA: Diagnosis not present

## 2019-04-24 LAB — CBC
HCT: 35.7 % — ABNORMAL LOW (ref 36.0–46.0)
Hemoglobin: 12 g/dL (ref 12.0–15.0)
MCH: 29.9 pg (ref 26.0–34.0)
MCHC: 33.6 g/dL (ref 30.0–36.0)
MCV: 89 fL (ref 80.0–100.0)
Platelets: 203 10*3/uL (ref 150–400)
RBC: 4.01 MIL/uL (ref 3.87–5.11)
RDW: 13.2 % (ref 11.5–15.5)
WBC: 6.5 10*3/uL (ref 4.0–10.5)
nRBC: 0 % (ref 0.0–0.2)

## 2019-04-24 LAB — SARS CORONAVIRUS 2 BY RT PCR (HOSPITAL ORDER, PERFORMED IN ~~LOC~~ HOSPITAL LAB): SARS Coronavirus 2: NEGATIVE

## 2019-04-24 LAB — GROUP B STREP BY PCR: Group B strep by PCR: POSITIVE — AB

## 2019-04-24 MED ORDER — PRENATAL MULTIVITAMIN CH
1.0000 | ORAL_TABLET | Freq: Every day | ORAL | Status: DC
  Administered 2019-04-25: 1 via ORAL
  Filled 2019-04-24: qty 1

## 2019-04-24 MED ORDER — INDOMETHACIN 25 MG PO CAPS
50.0000 mg | ORAL_CAPSULE | Freq: Four times a day (QID) | ORAL | Status: DC
  Administered 2019-04-24 – 2019-04-25 (×3): 50 mg via ORAL
  Filled 2019-04-24 (×4): qty 2

## 2019-04-24 MED ORDER — ACETAMINOPHEN 325 MG PO TABS: 650.0000 mg | ORAL_TABLET | ORAL | Status: DC | PRN

## 2019-04-24 MED ORDER — SODIUM CHLORIDE 0.9 % IV SOLN: 2.0000 g | Freq: Four times a day (QID) | INTRAVENOUS | Status: DC

## 2019-04-24 MED ORDER — INDOMETHACIN 50 MG RE SUPP: 50.0000 mg | Freq: Once | RECTAL | Status: DC

## 2019-04-24 MED ORDER — ZOLPIDEM TARTRATE 5 MG PO TABS: 5.0000 mg | ORAL_TABLET | Freq: Every evening | ORAL | Status: DC | PRN

## 2019-04-24 MED ORDER — DOCUSATE SODIUM 100 MG PO CAPS
100.0000 mg | ORAL_CAPSULE | Freq: Every day | ORAL | Status: DC
  Filled 2019-04-24: qty 1

## 2019-04-24 MED ORDER — CALCIUM CARBONATE ANTACID 500 MG PO CHEW: 2.0000 | CHEWABLE_TABLET | ORAL | Status: DC | PRN

## 2019-04-24 MED ORDER — LACTATED RINGERS IV SOLN
INTRAVENOUS | Status: DC
  Administered 2019-04-24 – 2019-04-26 (×5): via INTRAVENOUS

## 2019-04-24 MED ORDER — SODIUM CHLORIDE 0.9 % IV SOLN
2.0000 g | Freq: Four times a day (QID) | INTRAVENOUS | Status: DC
  Administered 2019-04-24 – 2019-04-26 (×7): 2 g via INTRAVENOUS
  Filled 2019-04-24 (×8): qty 2000

## 2019-04-24 NOTE — Progress Notes (Signed)
Pt admitted for rescue cerclage sched 9/5 Reviewed risk of surgery including infection, bleeding, injury to surrounding organ, trying to achieve viability and cannot prevent PROM. Disc need to be out of work for the remainder of pregnancy. Pt has been working to this point with continued shortening of her cervix despite prometrium Exam: Morbidly obese female Lungs clear to A Cor RRR  abd gravid obese  pelvic deferred  extr  no edema  Tracing (+) FHR   no ctx  IMP: cervical incompetence Morbid obesity IUP @ 22 2/7 weeks P) recue cerclage by MFM  In am

## 2019-04-24 NOTE — H&P (Signed)
Sandy Love is a 38 y.o. [id number] at [redacted]w[redacted]d presenting for cervical incompetence. Pt notes no contractions. Good fetal movement, No vaginal bleeding, not leaking fluid.  Patient has been followed outpatient for shortened cervix.  Was seen today by MFM and 0.3 mm close cervical length was noted.  On sterile speculum exam by Dr. Donalee Citrin cervix was noted to be closed.  Given that, the recommendation by Dr. Donalee Citrin was for ultrasound based emergent rescue cerclage.  He also recommended Indocin and prophylactic antibiotics preoperatively.  Patient was called at home and asked to present for admission.  I talked to the patient at 11:30 AM.  She has yet to present to the hospital.  I stressed the importance of bedrest and explained to her I would be planning strict bedrest with Trendelenburg as tolerated.  PNCare at [place] -Morbid obesity -Nondiagnostic panorama.  Normal quad -Recurrent pregnancy loss.  Patient has been on baby aspirin through the pregnancy.  Patient did first trimester Prometrium.  This was restarted at 16 weeks when cervix was noted to be short. -Shortening cervical length.  Cervix 2.9 to 3.1 cm at 16 weeks.  Cervix 2 cm with funneling at anatomy scan.  1 week later cervix was noted to be 7 mm.  Cervix today at MFM 3 mm. -GBS bacteriuria   Prenatal Transfer Tool  Maternal Diabetes: No Genetic Screening: Normal Maternal Ultrasounds/Referrals: Declined Fetal Ultrasounds or other Referrals:  Referred to Materal Fetal Medicine  Maternal Substance Abuse:  No Significant Maternal Medications:  None Significant Maternal Lab Results: Group B Strep positive     OB History    Gravida  4   Para      Term      Preterm      AB  3   Living        SAB  3   TAB      Ectopic      Multiple      Live Births             Past Medical History:  Diagnosis Date  . History of miscarriage 09/20/2014   X 3    . Obesity 09/20/2014  . Pedal edema 02/27/2015  .  Preventative health care 11/18/2014   Past Surgical History:  Procedure Laterality Date  . DILATION AND CURETTAGE OF UTERUS     Family History: family history includes Cancer (age of onset: 87) in her maternal aunt; Dementia in her paternal grandfather; Diabetes in her maternal grandmother and paternal grandmother; Obesity in her brother and sister; Renal Disease in her brother. Social History:  reports that she has quit smoking. Her smoking use included cigars and cigarettes. She has never used smokeless tobacco. She reports previous alcohol use. She reports that she does not use drugs.  Review of systems and physical exam on admission not done as I have not yet seen the patient       Assessment/Plan: 38 y.o. G4P0030 at [redacted]w[redacted]d Cervical incompetence.  Plan admission with strict bedrest with bedpan in Trendelenburg position as tolerates.  Will start Indocin 50 mix every 6.  Plan  latency antibiotics.  Cervical cerclage is tentatively scheduled for tomorrow at 9 AM with Dr. Donalee Citrin is the primary surgeon and Dr. Garwin Brothers is the assistant.  They may decide to change that plan.  While on bedrest would recommend Colace and Protonix.  I discussed with patient that she would likely stay at least 24 hours postoperatively. -Dispel.  Patient lives at home.  She does note some family support that can come help her.   Lendon ColonelKelly A Jarvin Ogren 04/24/2019, 2:19 PM

## 2019-04-24 NOTE — Progress Notes (Signed)
Results for [place], HEYDEN (MRN [id number]) as of 04/24/2019 18:02  Ref. Range 04/24/2019 15:24  Group B strep by PCR Latest Ref Range: NEGATIVE  POSITIVE (A)    Shared above result with Dr. Garwin Brothers via phone.  See new antibiotic orders.

## 2019-04-25 ENCOUNTER — Observation Stay (HOSPITAL_COMMUNITY): Payer: 59 | Admitting: Anesthesiology

## 2019-04-25 ENCOUNTER — Encounter (HOSPITAL_COMMUNITY)
Admission: AD | Disposition: A | Discharge status: Home / Self Care | Payer: Self-pay | Source: Home / Self Care | Attending: Obstetrics and Gynecology

## 2019-04-25 ENCOUNTER — Encounter (HOSPITAL_COMMUNITY): Payer: Self-pay | Admitting: Obstetrics and Gynecology

## 2019-04-25 DIAGNOSIS — O3432 Maternal care for cervical incompetence, second trimester: Secondary | ICD-10-CM | POA: Diagnosis not present

## 2019-04-25 DIAGNOSIS — O2622 Pregnancy care for patient with recurrent pregnancy loss, second trimester: Secondary | ICD-10-CM | POA: Diagnosis present

## 2019-04-25 DIAGNOSIS — Z87891 Personal history of nicotine dependence: Secondary | ICD-10-CM | POA: Diagnosis not present

## 2019-04-25 DIAGNOSIS — O09522 Supervision of elderly multigravida, second trimester: Secondary | ICD-10-CM | POA: Diagnosis not present

## 2019-04-25 DIAGNOSIS — Z20828 Contact with and (suspected) exposure to other viral communicable diseases: Secondary | ICD-10-CM | POA: Diagnosis present

## 2019-04-25 DIAGNOSIS — Z3A22 22 weeks gestation of pregnancy: Secondary | ICD-10-CM | POA: Diagnosis not present

## 2019-04-25 DIAGNOSIS — O99212 Obesity complicating pregnancy, second trimester: Secondary | ICD-10-CM | POA: Diagnosis not present

## 2019-04-25 DIAGNOSIS — O99820 Streptococcus B carrier state complicating pregnancy: Secondary | ICD-10-CM | POA: Diagnosis present

## 2019-04-25 DIAGNOSIS — O26872 Cervical shortening, second trimester: Secondary | ICD-10-CM | POA: Diagnosis present

## 2019-04-25 HISTORY — PX: CERVICAL CERCLAGE: SHX1329

## 2019-04-25 LAB — TYPE AND SCREEN
ABO/RH(D): O POS
Antibody Screen: NEGATIVE

## 2019-04-25 LAB — RPR: RPR Ser Ql: NONREACTIVE

## 2019-04-25 LAB — ABO/RH: ABO/RH(D): O POS

## 2019-04-25 SURGERY — CERCLAGE, CERVIX, VAGINAL APPROACH
Anesthesia: Spinal

## 2019-04-25 MED ORDER — OXYCODONE HCL 5 MG/5ML PO SOLN: 5.0000 mg | Freq: Once | ORAL | Status: DC | PRN

## 2019-04-25 MED ORDER — OXYCODONE HCL 5 MG PO TABS: 5.0000 mg | ORAL_TABLET | Freq: Once | ORAL | Status: DC | PRN

## 2019-04-25 MED ORDER — SODIUM CHLORIDE 0.9 % IV SOLN
INTRAVENOUS | Status: DC | PRN
  Administered 2019-04-25 (×2): 350 mL

## 2019-04-25 MED ORDER — ONDANSETRON HCL 4 MG/2ML IJ SOLN: 4.0000 mg | Freq: Once | INTRAMUSCULAR | Status: DC | PRN

## 2019-04-25 MED ORDER — ASPIRIN EC 81 MG PO TBEC
81.0000 mg | DELAYED_RELEASE_TABLET | Freq: Every evening | ORAL | Status: DC
  Administered 2019-04-25: 18:00:00 81 mg via ORAL
  Filled 2019-04-25: qty 1

## 2019-04-25 MED ORDER — BUPIVACAINE IN DEXTROSE 0.75-8.25 % IT SOLN
INTRATHECAL | Status: DC | PRN
  Administered 2019-04-25: 1.2 mL via INTRATHECAL

## 2019-04-25 MED ORDER — LACTATED RINGERS IV SOLN
INTRAVENOUS | Status: DC | PRN
  Administered 2019-04-25: 09:00:00 via INTRAVENOUS

## 2019-04-25 MED ORDER — INDOMETHACIN 25 MG PO CAPS
50.0000 mg | ORAL_CAPSULE | Freq: Four times a day (QID) | ORAL | Status: DC
  Administered 2019-04-25 – 2019-04-26 (×4): 50 mg via ORAL
  Filled 2019-04-25 (×4): qty 2

## 2019-04-25 MED ORDER — SOD CITRATE-CITRIC ACID 500-334 MG/5ML PO SOLN
ORAL | Status: AC
  Administered 2019-04-25: 09:00:00 30 mL
  Filled 2019-04-25: qty 30

## 2019-04-25 MED ORDER — FENTANYL CITRATE (PF) 100 MCG/2ML IJ SOLN: 25.0000 ug | INTRAMUSCULAR | Status: DC | PRN

## 2019-04-25 SURGICAL SUPPLY — 20 items
CANISTER SUCT 3000ML PPV (MISCELLANEOUS) ×2 IMPLANT
ELECT REM PT RETURN 9FT ADLT (ELECTROSURGICAL) ×2
ELECTRODE REM PT RTRN 9FT ADLT (ELECTROSURGICAL) ×1 IMPLANT
GLOVE BIO SURGEON STRL SZ7.5 (GLOVE) ×2 IMPLANT
GLOVE BIOGEL PI IND STRL 8 (GLOVE) ×1 IMPLANT
GLOVE BIOGEL PI INDICATOR 8 (GLOVE) ×1
GOWN STRL REUS W/ TWL LRG LVL3 (GOWN DISPOSABLE) ×2 IMPLANT
GOWN STRL REUS W/TWL LRG LVL3 (GOWN DISPOSABLE) ×2
HIBICLENS CHG 4% 4OZ BTL (MISCELLANEOUS) ×2 IMPLANT
HOVERMATT SINGLE USE (MISCELLANEOUS) ×2 IMPLANT
PACK VAGINAL MINOR WOMEN LF (CUSTOM PROCEDURE TRAY) ×2 IMPLANT
PAD OB MATERNITY 4.3X12.25 (PERSONAL CARE ITEMS) ×2 IMPLANT
PAD PREP 24X48 CUFFED NSTRL (MISCELLANEOUS) ×2 IMPLANT
PENCIL BUTTON HOLSTER BLD 10FT (ELECTRODE) ×2 IMPLANT
SUT MERSILENE FIBER S 5 CTX 12 (SUTURE) ×2 IMPLANT
SYR BULB IRRIGATION 50ML (SYRINGE) IMPLANT
TOWEL OR 17X24 6PK STRL BLUE (TOWEL DISPOSABLE) ×4 IMPLANT
TRAY FOLEY W/BAG SLVR 14FR (SET/KITS/TRAYS/PACK) ×2 IMPLANT
TUBING NON-CON 1/4 X 20 CONN (TUBING) ×2 IMPLANT
YANKAUER SUCT BULB TIP NO VENT (SUCTIONS) ×2 IMPLANT

## 2019-04-25 NOTE — Anesthesia Procedure Notes (Signed)
Spinal  Patient location during procedure: OR Staffing Anesthesiologist: Lidia Collum, MD Performed: anesthesiologist  Preanesthetic Checklist Completed: patient identified, surgical consent, pre-op evaluation, timeout performed, IV checked, risks and benefits discussed and monitors and equipment checked Spinal Block Patient position: sitting Prep: DuraPrep Patient monitoring: continuous pulse ox, blood pressure and heart rate Approach: midline Location: L3-4 Injection technique: catheter Needle Needle type: Pencan and Tuohy  Needle gauge: 25 G Needle length: 12.7 cm Additional Notes The patient was prepped and draped in the usual sterile fashion. A combined spinal-epidural technique was performed with a 9 cm 17g Tuohy needle and loss of resistance technique. After encountering LOR, a 12.7 cm 25g Pencan spinal needle was introduced via the Tuohy and clear CSF was aspirated prior to injection of local anesthetic. Patient tolerated the procedure well without complications.

## 2019-04-25 NOTE — Op Note (Signed)
Maternal-Fetal Medicine   Sandy Love MRN: [id number] Procedure: Rescue cerclage   PRE-OPERATIVE DIAGNOSIS:  22-weeks' gestation with incompetent cervix    POST-OPERATIVE DIAGNOSIS:  Same with rescue cerclage   PROCEDURE:  Procedure(s): RESCUE CERCLAGE CERVICAL   SURGEON:  Surgeon(s) and Role:  ** Tama High, MD - Primary   * Servando Salina, MD - Assisting       ANESTHESIA:   spinal    I discussed procedure and complications in detail before obtaining consent in the [facility] After informed consent, the patient was taken to the OR. After spinal anesthesia, the patient was prepped and draped in dorsal lithotomy position. A sterile weighted speculum was inserted. The cervix and vagina appeared healthy. Vagina and cervix were cleaned with betadine. External os was closed. cervix is about 1 centimeter long. Vagina was cleaned gently with betadine swab. The anterior lip of the cervix was held with a ring forceps.    With help of Bovie, a small incision of about 2 cm was made at the cervicovesical junction and the bladder was pushed up. Three hundred fifty milliliters of saline was injected (7x 50 mL) into the bladder through Foley Port by the nurse with an intention to replace the membranes into the uterine cavity. On reinserting the speculum, the cervix was found to be retracted and was inaccessible. The bladder was emptied again that facilitated visualization of the cervix.   A Mersilene 5 tape stitch was placed circumferentially around the cervix and the final stitch exited close to 1 O'Clock position. Before tying the first knot, 350 milliliters of saline was instilled into the bladder by the nurse and a hemostat clamp was placed on the drainage tube. After tying the first knot, the bladder was emptied by releasing the clamp on the Foley catheter drainage tube. Five secure knots were placed anteriorly. The external os was closed.  Vagina was inspected and no lacerations were  seen. Complete hemostasis was achieved. Bladder was draining clear urine. Foley catheter will be removed after spinal anesthesia wears off. Rectal examination revealed no inadvertent sutures.   Counts correct. Estimated blood loss: 50 milliliters.   I reassured the patient of successful procedure. I recommended that she continue taking vaginal progesterone from tomorrow night. Patient left the operating room in stable condition.

## 2019-04-25 NOTE — Plan of Care (Signed)
  Problem: Clinical Measurements: Goal: Ability to maintain clinical measurements within normal limits will improve Outcome: Progressing  Pt. Scheduled for cerclage at [place] assessed by doppler - 145. No complaints of pain or new symptoms.

## 2019-04-25 NOTE — Brief Op Note (Signed)
04/25/2019  10:45 AM  PATIENT:  Sandy Love  38 y.o. female  PRE-OPERATIVE DIAGNOSIS: 22-weeks' pregnancy with  incompetent cervix  POST-OPERATIVE DIAGNOSIS:  22-weeks' pregnancy with incompetent cervix with rescue cerclage.  PROCEDURE:  Rescue Cerclage SURGEON:  Surgeon(s) and Role:    * Tama High, MD - Primary    * Servando Salina, MD  PHYSICIAN ASSISTANT:   ASSISTANTS: Servando Salina, MD  ANESTHESIA:   spinal  EBL:  50 mL   BLOOD ADMINISTERED:none  DRAINS: none   LOCAL MEDICATIONS USED:  NONE  SPECIMEN:  No Specimen  DISPOSITION OF SPECIMEN:  N/A  COUNTS:  YES  TOURNIQUET:  * No tourniquets in log *  DICTATION: .Note written in EPIC  PLAN OF CARE: Admit for overnight observation  PATIENT DISPOSITION:  PACU - hemodynamically stable.   Delay start of Pharmacological VTE agent (>24hrs) due to surgical blood loss or risk of bleeding: not applicable

## 2019-04-25 NOTE — Anesthesia Postprocedure Evaluation (Signed)
Anesthesia Post Note  Patient: Sandy Love  Procedure(s) Performed: CERCLAGE CERVICAL (N/A )     Patient location during evaluation: PACU Anesthesia Type: Spinal Level of consciousness: oriented and awake and alert Pain management: pain level controlled Vital Signs Assessment: post-procedure vital signs reviewed and stable Respiratory status: spontaneous breathing, respiratory function stable and nonlabored ventilation Cardiovascular status: blood pressure returned to baseline and stable Postop Assessment: no headache, no backache, no apparent nausea or vomiting and spinal receding Anesthetic complications: no    Last Vitals:  Vitals:   04/25/19 1136 04/25/19 1139  BP: 139/79   Pulse: 87   Resp: 16   Temp: 36.8 C   SpO2: 100% 100%    Last Pain:  Vitals:   04/25/19 1136  TempSrc: Oral  PainSc:    Pain Goal:                   Lidia Collum

## 2019-04-25 NOTE — Anesthesia Preprocedure Evaluation (Addendum)
Anesthesia Evaluation  Patient identified by MRN, date of birth, ID band Patient awake    Reviewed: Allergy & Precautions, NPO status , Patient's Chart, lab work & pertinent test results  History of Anesthesia Complications Negative for: history of anesthetic complications  Airway Mallampati: I  TM Distance: >3 FB Neck ROM: Full    Dental  (+) Teeth Intact   Pulmonary neg pulmonary ROS, former smoker,    Pulmonary exam normal        Cardiovascular negative cardio ROS Normal cardiovascular exam     Neuro/Psych negative neurological ROS  negative psych ROS   GI/Hepatic negative GI ROS, Neg liver ROS,   Endo/Other  Morbid obesity  Renal/GU negative Renal ROS  negative genitourinary   Musculoskeletal negative musculoskeletal ROS (+)   Abdominal   Peds  Hematology negative hematology ROS (+)   Anesthesia Other Findings BMI 50  Reproductive/Obstetrics (+) Pregnancy                            Anesthesia Physical Anesthesia Plan  ASA: III  Anesthesia Plan: Spinal   Post-op Pain Management:    Induction:   PONV Risk Score and Plan: 3 and Treatment may vary due to age or medical condition and Ondansetron  Airway Management Planned: Natural Airway  Additional Equipment: None  Intra-op Plan:   Post-operative Plan:   Informed Consent: I have reviewed the patients History and Physical, chart, labs and discussed the procedure including the risks, benefits and alternatives for the proposed anesthesia with the patient or authorized representative who has indicated his/her understanding and acceptance.       Plan Discussed with:   Anesthesia Plan Comments:        Anesthesia Quick Evaluation

## 2019-04-25 NOTE — Transfer of Care (Signed)
Immediate Anesthesia Transfer of Care Note  Patient: Sandy Love  Procedure(s) Performed: CERCLAGE CERVICAL (N/A )  Patient Location: PACU  Anesthesia Type:Spinal  Level of Consciousness: awake and alert   Airway & Oxygen Therapy: Patient Spontanous Breathing  Post-op Assessment: Report given to RN and Post -op Vital signs reviewed and stable  Post vital signs: Reviewed  Last Vitals:  Vitals Value Taken Time  BP    Temp    Pulse    Resp    SpO2      Last Pain:  Vitals:   04/25/19 0800  TempSrc:   PainSc: 0-No pain         Complications: No apparent anesthesia complications

## 2019-04-25 NOTE — Progress Notes (Signed)
Maternal-Fetal Medicine  Sandy Love, G1 P0 at 22-5 days' gestation, is admitted with rescue cerclage.  She does not have fever or abdominal cramping or vaginal bleeding.  P/E: Patient is comfortably lying in bed; not in pain. Vitals stable Abd: Soft; no tenderness.  I explained cerclage procedure again and the possible complications including bleeding, infection, miscarriage, and injuries to bladder or bowel (rare). Alternatively, she can continue vaginal progesterone (seems an inferior option). Patient opted to have rescue cerclage.  Informed consent was obtained.

## 2019-04-26 NOTE — Progress Notes (Signed)
Discharge instructions and prescriptions given to pt. Discussed signs and symptoms to report to the MD, pre-term labor, PROM, upcoming appointments, and meds. Pt verbalizes understanding and has no questions or concerns at this time. Provided pt with a letter stating she needs to stay out of work until further notice from her doctor. Pt reports positive fetal movement and fetal HR was 145. Pt discharged from hospital in stable condition.

## 2019-04-26 NOTE — Discharge Summary (Signed)
Physician Discharge Summary  Patient ID: Sandy Love MRN: [id number] DOB/AGE: 37-Nov-1982 37 y.o.  Admit date: 04/24/2019 Discharge date: 04/26/2019  Admission Diagnoses: cervical incompetence, morbid obesity, IUP @ 22 2/7 weeks, GBS cx positive Discharge Diagnoses: cervical incompetence, morbid obesity, IUP @ 22 4/7 weeks, GBS cx positive Principal Problem:   Cervical shortening affecting pregnancy in second trimester Active Problems:   Maternal morbid obesity, antepartum, second trimester ([place])   Cervical incompetence affecting management of pregnancy in second trimester, antepartum   Discharged Condition: stable  Hospital Course: pt was admitted due to cervical shortening. (+) GBS cx resulted in ampicillin being started and indocin until cerclage placement. Pt underwent rescue cervical cerclage( see op note) and postoperatively had continuation of Ampicillin and Indocin. No evidence of contraction prior to surgery  Consults: MFM  Significant Diagnostic Studies: labs:  CBC Latest Ref Rng & Units 04/24/2019 01/14/2019 12/02/2018  WBC 4.0 - 10.5 K/uL 6.5 6.6 5.0  Hemoglobin 12.0 - 15.0 g/dL 12.0 14.2 13.7  Hematocrit 36.0 - 46.0 % 35.7(L) 41.4 42.9  Platelets 150 - 400 K/uL 203 203.0 213  GBS cx  Treatments: surgery: Cervical cerclage  Discharge Exam: Blood pressure (!) 148/60, pulse 94, temperature 98.8 F (37.1 C), temperature source Oral, resp. rate 18, height 5' 7.5" (1.715 m), weight (!) 147.4 kg, last menstrual period 11/19/2018, SpO2 97 %. General appearance: alert, cooperative, no distress and morbidly obese Resp: clear to auscultation bilaterally Cardio: regular rate and rhythm, S1, S2 normal, no murmur, click, rub or gallop GI: soft, non-tender; bowel sounds normal; no masses,  no organomegaly Pelvic: deferred Extremities: no edema, redness or tenderness in the calves or thighs  Disposition: Discharge disposition: 01-Home or Self Care       Discharge Instructions     Diet - low sodium heart healthy   Complete by: As directed    Discharge instructions   Complete by: As directed    Nothing per vagina( except med). Showers no bath. Call if vaginal bleeding, abdominal pain or contractions, leakage of fluid   May walk up steps   Complete by: As directed      Allergies as of 04/26/2019   No Known Allergies     Medication List    TAKE these medications   aspirin EC 81 MG tablet Take 81 mg by mouth every evening.   Prenatal/Folic Acid Tabs Take 1 tablet by mouth daily.   progesterone 200 MG Supp Place 200 mg vaginally at bedtime.      Follow-up Information    Brien Few, MD Follow up on 05/01/2019.   Specialty: Obstetrics and Gynecology Contact information: [place] [address] [phone]           Signed: Marvene Staff 04/26/2019, 11:17 AM

## 2019-04-26 NOTE — Progress Notes (Signed)
HD #3 22 4/7 wk Rescue cerclage S: feels well. Denies pain or vaginal bleeding  O: BP (!) 148/60 (BP Location: Left Arm)   Pulse 94   Temp 98.8 F (37.1 C) (Oral)   Resp 18   Ht 5' 7.5" (1.715 m)   Wt (!) 147.4 kg   LMP 11/19/2018 (Exact Date)   SpO2 97%   BMI 50.15 kg/m  Lungs clear to A  Cor RRR  Abd; obese soft gravid non tender Pad none  extr no edema or calf tenderness   (+) FHR  IMP cervical incompetence s/p rescue cervical cerclage GBS positive affecting pregnancy Morbid obesity IUP@ 22 4/7 week P) d/c home. F/u Fri with Dr Ronita Hipps OOW until appt. Discuss work with primary OB at that appt though recommend OOW for remainder of pregnancy.

## 2019-04-26 NOTE — Discharge Instructions (Signed)
Premature Rupture and Preterm Premature Rupture of Membranes  A sac made up of membranes surrounds your baby in the womb (uterus). Rupture of membranes is when this sac breaks open. This is also known as your "water breaking." When this sac breaks before labor starts, it is called premature rupture of membranes (PROM). If this happens before 37 weeks of being pregnant, it is called preterm premature rupture of membranes (PPROM). PPROM is serious. It needs medical care right away. What increases the risk of PPROM? PPROM is more likely to happen in women who:  Have an infection.  Have had PPROM before.  Have a cervix that is short.  Have bleeding during the second or third trimester.  Have a low BMI. This is a measure of body fat.  Smoke.  Use drugs.  Have a low socioeconomic status. What problems can be caused by PROM and PPROM? This condition creates health dangers for the mother and the baby. These include:  Giving birth to the baby too early (prematurely).  Getting a serious infection of the placenta (chorioamnionitis).  Having the placenta detach from the uterus early (placental abruption).  Squeezing of the umbilical cord.  Getting a serious infection after delivery. What are the signs of PROM and PPROM?  A sudden gush of fluid from the vagina.  A slow leak of fluid from the vagina.  Your underwear is wet. What should I do if I think my water broke? Call your doctor right away. You will need to go to the hospital to get checked right away. What happens if I am told that I have PROM or PPROM? You will have tests done at the hospital.  If you have PROM, you may be given medicine to start labor (be induced). This may be done if you are not having contractions during the 24 hours after your water broke.  If you have PPROM and are not having contractions, you may be given medicine to start labor. It will depend on how far along you are in your pregnancy. If you have  PPROM:  You and your baby will be watched closely to see if you have infections or other problems.  You may be given: ? An antibiotic medicine. This can stop an infection from starting. ? A steroid medicine. This can help your baby's lungs develop faster. ? A medicine to help prevent cerebral palsy in your baby. ? A medicine to stop early labor (preterm labor).  You may be told to stay in bed except to use the bathroom (bed rest).  You may be given medicine to start labor. This may be done if there are problems with you or the baby. Your treatment will depend on many factors. Contact a doctor if:  Your water breaks and you are not having contractions. Get help right away if:  Your water breaks before you are [redacted] weeks pregnant. Summary  When your water breaks before labor starts, it is called premature rupture of membranes (PROM).  When PROM happens before 37 weeks of pregnancy, it is called preterm premature rupture of membranes (PPROM).  If you are not having contractions, your labor may be started for you. This information is not intended to replace advice given to you by your health care provider. Make sure you discuss any questions you have with your health care provider. Document Released: 11/02/2008 Document Revised: 11/28/2018 Document Reviewed: 04/26/2016 Elsevier Patient Education  2020 [employer] Cervical Cerclage, Care After  This sheet gives you information about  how to care for yourself after your procedure. Your health care provider may also give you more specific instructions. If you have problems or questions, contact your health care provider. What can I expect after the procedure? After your procedure, it is common to have:  Cramping in your abdomen.  Mucus discharge for several days.  Painful urination (dysuria).  Small drops of blood coming from your vagina (spotting). Follow these instructions at home:  Follow instructions from your health care  provider about bed rest, if this applies. You may need to be on bed rest for up to 3 days.  Take over-the-counter and prescription medicines only as told by your health care provider.  Do not drive or use heavy machinery while taking prescription pain medicine.  Keep track of your vaginal discharge and watch for any changes. If you notice changes, tell your health care provider.  Avoid physical activities and exercise until your health care provider approves. Ask your health care provider what activities are safe for you.  Until your health care provider approves: ? Do not douche. ? Do not have sexual intercourse.  Keep all pre-birth (prenatal) visits and all follow-up visits as told by your health care provider. This is important. You will probably have weekly visits to have your cervix checked, and you may need an ultrasound. Contact a health care provider if:  You have abnormal or bad-smelling vaginal discharge, such as clots.  You develop a rash on your skin. This may look like redness and swelling.  You become light-headed or feel like you are going to faint.  You have abdominal pain that does not get better with medicine.  You have persistent nausea or vomiting. Get help right away if:  You have vaginal bleeding that is heavier or more frequent than spotting.  You are leaking fluid or have a gush of fluid from your vagina (your water breaks).  You have a fever or chills.  You faint.  You have uterine contractions. These may feel like: ? A back ache. ? Lower abdominal pain. ? Mild cramps, similar to menstrual cramps. ? Tightening or pressure in your abdomen.  You think that your baby is not moving as much as usual, or you cannot feel your baby move.  You have chest pain.  You have shortness of breath. This information is not intended to replace advice given to you by your health care provider. Make sure you discuss any questions you have with your health care  provider. Document Released: 05/27/2013 Document Revised: 07/19/2017 Document Reviewed: 03/09/2016 Elsevier Patient Education  2020 [employer].

## 2019-04-29 ENCOUNTER — Ambulatory Visit (HOSPITAL_COMMUNITY): Payer: 59

## 2019-05-06 ENCOUNTER — Ambulatory Visit (HOSPITAL_COMMUNITY)
Admission: RE | Admit: 2019-05-06 | Discharge: 2019-05-06 | Disposition: A | Discharge status: Home / Self Care | Payer: 59 | Source: Ambulatory Visit | Attending: Obstetrics and Gynecology | Admitting: Obstetrics and Gynecology

## 2019-05-06 ENCOUNTER — Encounter (HOSPITAL_COMMUNITY): Payer: Self-pay

## 2019-05-06 ENCOUNTER — Ambulatory Visit (HOSPITAL_COMMUNITY): Payer: 59 | Admitting: *Deleted

## 2019-05-06 ENCOUNTER — Other Ambulatory Visit: Payer: Self-pay

## 2019-05-06 VITALS — BP 145/84 | HR 101 | Temp 98.2°F

## 2019-05-06 DIAGNOSIS — O26872 Cervical shortening, second trimester: Secondary | ICD-10-CM | POA: Diagnosis not present

## 2019-05-06 DIAGNOSIS — O3432 Maternal care for cervical incompetence, second trimester: Secondary | ICD-10-CM | POA: Insufficient documentation

## 2019-05-06 DIAGNOSIS — O2622 Pregnancy care for patient with recurrent pregnancy loss, second trimester: Secondary | ICD-10-CM | POA: Diagnosis not present

## 2019-05-06 DIAGNOSIS — O09522 Supervision of elderly multigravida, second trimester: Secondary | ICD-10-CM

## 2019-05-06 DIAGNOSIS — Z3A24 24 weeks gestation of pregnancy: Secondary | ICD-10-CM | POA: Diagnosis not present

## 2019-05-06 DIAGNOSIS — Z3686 Encounter for antenatal screening for cervical length: Secondary | ICD-10-CM

## 2019-05-06 DIAGNOSIS — O3430 Maternal care for cervical incompetence, unspecified trimester: Secondary | ICD-10-CM | POA: Insufficient documentation

## 2019-05-06 DIAGNOSIS — O289 Unspecified abnormal findings on antenatal screening of mother: Secondary | ICD-10-CM | POA: Diagnosis not present

## 2019-05-06 MED FILL — VITAFOL GUMMIES: 3.33-0.333- | 30 days supply | Qty: 90 | Fill #1

## 2019-05-06 MED FILL — PROGESTERONE 200 MG CAPSULE: 200 | 30 days supply | Qty: 30 | Fill #1

## 2019-05-14 DIAGNOSIS — O09522 Supervision of elderly multigravida, second trimester: Secondary | ICD-10-CM | POA: Diagnosis not present

## 2019-05-14 DIAGNOSIS — Z3A25 25 weeks gestation of pregnancy: Secondary | ICD-10-CM | POA: Diagnosis not present

## 2019-05-21 DIAGNOSIS — Z3689 Encounter for other specified antenatal screening: Secondary | ICD-10-CM | POA: Diagnosis not present

## 2019-05-21 DIAGNOSIS — Z3A26 26 weeks gestation of pregnancy: Secondary | ICD-10-CM | POA: Diagnosis not present

## 2019-05-21 DIAGNOSIS — O3432 Maternal care for cervical incompetence, second trimester: Secondary | ICD-10-CM | POA: Diagnosis not present

## 2019-05-22 ENCOUNTER — Encounter: Payer: Self-pay | Admitting: Family Medicine

## 2019-05-28 DIAGNOSIS — O3432 Maternal care for cervical incompetence, second trimester: Secondary | ICD-10-CM | POA: Diagnosis not present

## 2019-05-28 DIAGNOSIS — Z3A27 27 weeks gestation of pregnancy: Secondary | ICD-10-CM | POA: Diagnosis not present

## 2019-05-28 DIAGNOSIS — O99810 Abnormal glucose complicating pregnancy: Secondary | ICD-10-CM | POA: Diagnosis not present

## 2019-06-05 DIAGNOSIS — O3433 Maternal care for cervical incompetence, third trimester: Secondary | ICD-10-CM | POA: Diagnosis not present

## 2019-06-05 DIAGNOSIS — Z3A28 28 weeks gestation of pregnancy: Secondary | ICD-10-CM | POA: Diagnosis not present

## 2019-06-05 MED FILL — PROGESTERONE 200 MG CAPSULE: 200 | 30 days supply | Qty: 30 | Fill #2

## 2019-06-08 ENCOUNTER — Other Ambulatory Visit: Payer: Self-pay

## 2019-06-08 ENCOUNTER — Inpatient Hospital Stay (HOSPITAL_COMMUNITY)
Admission: AD | Admit: 2019-06-08 | Discharge: 2019-06-15 | DRG: 768 | Disposition: A | Discharge status: Home / Self Care | Payer: 59 | Attending: Obstetrics and Gynecology | Admitting: Obstetrics and Gynecology

## 2019-06-08 ENCOUNTER — Encounter (HOSPITAL_COMMUNITY): Payer: Self-pay

## 2019-06-08 DIAGNOSIS — Z3A28 28 weeks gestation of pregnancy: Secondary | ICD-10-CM | POA: Diagnosis not present

## 2019-06-08 DIAGNOSIS — O9902 Anemia complicating childbirth: Secondary | ICD-10-CM | POA: Diagnosis present

## 2019-06-08 DIAGNOSIS — O42912 Preterm premature rupture of membranes, unspecified as to length of time between rupture and onset of labor, second trimester: Secondary | ICD-10-CM

## 2019-06-08 DIAGNOSIS — O4290 Premature rupture of membranes, unspecified as to length of time between rupture and onset of labor, unspecified weeks of gestation: Secondary | ICD-10-CM

## 2019-06-08 DIAGNOSIS — Z3A00 Weeks of gestation of pregnancy not specified: Secondary | ICD-10-CM | POA: Diagnosis not present

## 2019-06-08 DIAGNOSIS — O26873 Cervical shortening, third trimester: Secondary | ICD-10-CM | POA: Diagnosis present

## 2019-06-08 DIAGNOSIS — O411230 Chorioamnionitis, third trimester, not applicable or unspecified: Secondary | ICD-10-CM | POA: Diagnosis present

## 2019-06-08 DIAGNOSIS — Z20828 Contact with and (suspected) exposure to other viral communicable diseases: Secondary | ICD-10-CM | POA: Diagnosis present

## 2019-06-08 DIAGNOSIS — Z3A29 29 weeks gestation of pregnancy: Secondary | ICD-10-CM | POA: Diagnosis not present

## 2019-06-08 DIAGNOSIS — O99214 Obesity complicating childbirth: Secondary | ICD-10-CM | POA: Diagnosis present

## 2019-06-08 DIAGNOSIS — O42113 Preterm premature rupture of membranes, onset of labor more than 24 hours following rupture, third trimester: Secondary | ICD-10-CM | POA: Diagnosis not present

## 2019-06-08 DIAGNOSIS — Z8759 Personal history of other complications of pregnancy, childbirth and the puerperium: Secondary | ICD-10-CM

## 2019-06-08 DIAGNOSIS — O42913 Preterm premature rupture of membranes, unspecified as to length of time between rupture and onset of labor, third trimester: Secondary | ICD-10-CM | POA: Diagnosis present

## 2019-06-08 DIAGNOSIS — D649 Anemia, unspecified: Secondary | ICD-10-CM | POA: Diagnosis present

## 2019-06-08 DIAGNOSIS — O411290 Chorioamnionitis, unspecified trimester, not applicable or unspecified: Secondary | ICD-10-CM | POA: Diagnosis present

## 2019-06-08 DIAGNOSIS — O42013 Preterm premature rupture of membranes, onset of labor within 24 hours of rupture, third trimester: Secondary | ICD-10-CM | POA: Diagnosis not present

## 2019-06-08 DIAGNOSIS — O3433 Maternal care for cervical incompetence, third trimester: Secondary | ICD-10-CM | POA: Diagnosis not present

## 2019-06-08 LAB — COMPREHENSIVE METABOLIC PANEL
ALT: 17 U/L (ref 0–44)
AST: 17 U/L (ref 15–41)
Albumin: 2.4 g/dL — ABNORMAL LOW (ref 3.5–5.0)
Alkaline Phosphatase: 71 U/L (ref 38–126)
Anion gap: 7 (ref 5–15)
BUN: <5 mg/dL — ABNORMAL LOW (ref 6–20)
CO2: 25 mmol/L (ref 22–32)
Calcium: 8.9 mg/dL (ref 8.9–10.3)
Chloride: 105 mmol/L (ref 98–111)
Creatinine, Ser: 0.78 mg/dL (ref 0.44–1.00)
GFR calc Af Amer: >60 mL/min (ref >60)
GFR calc non Af Amer: >60 mL/min (ref >60)
Glucose, Bld: 107 mg/dL — ABNORMAL HIGH (ref 70–99)
Potassium: 4.1 mmol/L (ref 3.5–5.1)
Sodium: 137 mmol/L (ref 135–145)
Total Bilirubin: 0.4 mg/dL (ref 0.3–1.2)
Total Protein: 5.9 g/dL — ABNORMAL LOW (ref 6.5–8.1)

## 2019-06-08 LAB — CBC
HCT: 33.6 % — ABNORMAL LOW (ref 36.0–46.0)
Hemoglobin: 11.6 g/dL — ABNORMAL LOW (ref 12.0–15.0)
MCH: 30.4 pg (ref 26.0–34.0)
MCHC: 34.5 g/dL (ref 30.0–36.0)
MCV: 88.2 fL (ref 80.0–100.0)
Platelets: 215 10*3/uL (ref 150–400)
RBC: 3.81 MIL/uL — ABNORMAL LOW (ref 3.87–5.11)
RDW: 13.5 % (ref 11.5–15.5)
WBC: 6.9 10*3/uL (ref 4.0–10.5)
nRBC: 0 % (ref 0.0–0.2)

## 2019-06-08 LAB — TYPE AND SCREEN
ABO/RH(D): O POS
Antibody Screen: NEGATIVE

## 2019-06-08 LAB — SARS CORONAVIRUS 2 BY RT PCR (HOSPITAL ORDER, PERFORMED IN ~~LOC~~ HOSPITAL LAB): SARS Coronavirus 2: NEGATIVE

## 2019-06-08 MED ORDER — MAGNESIUM SULFATE BOLUS VIA INFUSION
2.0000 g | Freq: Once | INTRAVENOUS | Status: AC
  Administered 2019-06-08: 2 g via INTRAVENOUS
  Filled 2019-06-08: qty 1000

## 2019-06-08 MED ORDER — AMOXICILLIN 500 MG PO CAPS
500.0000 mg | ORAL_CAPSULE | Freq: Three times a day (TID) | ORAL | Status: DC
  Administered 2019-06-10 – 2019-06-13 (×9): 500 mg via ORAL
  Filled 2019-06-08 (×9): qty 1

## 2019-06-08 MED ORDER — SODIUM CHLORIDE 0.9 % IV SOLN
2.0000 g | Freq: Four times a day (QID) | INTRAVENOUS | Status: AC
  Administered 2019-06-08 – 2019-06-10 (×8): 2 g via INTRAVENOUS
  Filled 2019-06-08 (×8): qty 2000

## 2019-06-08 MED ORDER — BETAMETHASONE SOD PHOS & ACET 6 (3-3) MG/ML IJ SUSP
12.0000 mg | INTRAMUSCULAR | Status: AC
  Administered 2019-06-08 – 2019-06-09 (×2): 12 mg via INTRAMUSCULAR
  Filled 2019-06-08 (×2): qty 2

## 2019-06-08 MED ORDER — SODIUM CHLORIDE 0.9% FLUSH
3.0000 mL | Freq: Two times a day (BID) | INTRAVENOUS | Status: DC
  Administered 2019-06-09 – 2019-06-13 (×9): 3 mL via INTRAVENOUS

## 2019-06-08 MED ORDER — SODIUM CHLORIDE 0.9% FLUSH: 3.0000 mL | INTRAVENOUS | Status: DC | PRN

## 2019-06-08 MED ORDER — MAGNESIUM SULFATE 40 GM/1000ML IV SOLN
1.0000 g/h | INTRAVENOUS | Status: AC
  Administered 2019-06-08: 1 g/h via INTRAVENOUS
  Filled 2019-06-08: qty 1000

## 2019-06-08 MED ORDER — SODIUM CHLORIDE 0.9 % IV SOLN
250.0000 mL | INTRAVENOUS | Status: DC | PRN
  Administered 2019-06-08: 250 mL via INTRAVENOUS

## 2019-06-08 MED ORDER — DOCUSATE SODIUM 100 MG PO CAPS
100.0000 mg | ORAL_CAPSULE | Freq: Every day | ORAL | Status: DC
  Administered 2019-06-08 – 2019-06-13 (×6): 100 mg via ORAL
  Filled 2019-06-08 (×6): qty 1

## 2019-06-08 MED ORDER — PRENATAL MULTIVITAMIN CH
1.0000 | ORAL_TABLET | Freq: Every day | ORAL | Status: DC
  Filled 2019-06-08: qty 1

## 2019-06-08 MED ORDER — ACETAMINOPHEN 325 MG PO TABS: 650.0000 mg | ORAL_TABLET | ORAL | Status: DC | PRN

## 2019-06-08 MED ORDER — AZITHROMYCIN 250 MG PO TABS
1000.0000 mg | ORAL_TABLET | Freq: Once | ORAL | Status: AC
  Administered 2019-06-08: 1000 mg via ORAL
  Filled 2019-06-08: qty 4

## 2019-06-08 MED ORDER — COMPLETENATE 29-1 MG PO CHEW
1.0000 | CHEWABLE_TABLET | Freq: Every day | ORAL | Status: DC
  Administered 2019-06-08 – 2019-06-13 (×5): 1 via ORAL
  Filled 2019-06-08 (×4): qty 1

## 2019-06-08 MED ORDER — ZOLPIDEM TARTRATE 5 MG PO TABS: 5.0000 mg | ORAL_TABLET | Freq: Every evening | ORAL | Status: DC | PRN

## 2019-06-08 MED ORDER — CALCIUM CARBONATE ANTACID 500 MG PO CHEW: 2.0000 | CHEWABLE_TABLET | ORAL | Status: DC | PRN

## 2019-06-08 MED ORDER — LACTATED RINGERS IV SOLN
INTRAVENOUS | Status: DC
  Administered 2019-06-08 – 2019-06-09 (×2): via INTRAVENOUS

## 2019-06-08 NOTE — MAU Note (Signed)
Asymptomatic, swab collected. 

## 2019-06-08 NOTE — H&P (Signed)
NAMEMEGEAN, FABIO MEDICAL RECORD ID:[id number] ACCOUNT [id number] DATE OF BIRTH:[birth year] FACILITY: [facility] LOCATION: [facility] PHYSICIAN:Mateo Overbeck J. Ronita Hipps, MD  HISTORY AND PHYSICAL  DATE OF ADMISSION:  06/08/2019  CHIEF COMPLAINT:  Fluid leakage.  HISTORY OF PRESENT ILLNESS:  The patient is a 38 year old white female, G5 P0, at 28 weeks and 5 days gestation who presents now with leakage of fluid and preterm premature rupture of membranes.  The patient has a history of emergency rescue cerclage that was done 04/25/19.  Her cervix has been shortened without issue up until this point in time.  She denies bleeding.  She reports good fetal movement.  Noticed onset of leakage of fluid this morning upon presentation in the office. Good FM. No bleeding. History of AGA on sono 1032 gms on 10/2.  ALLERGIES:  No known drug allergies.  MEDICATIONS:  Prometrium, prenatal vitamins, and a baby aspirin.  SOCIAL HISTORY:  She is a nonsmoker, nondrinker.  She denies domestic physical violence.  FAMILY HISTORY:  She has a family history of diabetes.  PAST MEDICAL HISTORY:  Personal history of 3 spontaneous pregnancy losses and 1 induced pregnancy loss.  PAST SURGICAL HISTORY:  D and C and rescue cerclage as noted.  Prenatal course has been complicated by GBS bacteriuria, cervical insufficiency, and patient's obesity.  She did have an ultrasound performed for fetal growth on 10/08 revealing an estimated fetal weight of 1032 g and otherwise normal growing fetus.  PHYSICAL EXAMINATION: GENERAL:  She is a well-developed, well-nourished African-American female in no acute distress, morbidly obese. HEENT:  Normal. NECK:  Supple, full range of motion. LUNGS:  Clear to auscultation. HEART:  Reveals a regular rate and rhythm. ABDOMEN:  Soft, gravid, nontender. EXTREMITIES:  No cords. NEUROLOGIC:  Nonfocal. SKIN:  Intact. PELVIC:  Sterile speculum exam reveals clear amniotic fluid.  Cerclage was visualized  and looks to be intact.  No evidence of cervical dilatation noted.    IMPRESSION: 1.  Preterm premature rupture of membranes at 28 weeks and 5 days. 2.  Morbid obesity. 3.  History of recurrent pregnancy loss.  PLAN:  Proceed with admission.  We will treat with PPROM antibiotic prophylaxis with Zithromax, ampicillin, and amoxicillin protocol.  A betamethasone series, magnesium sulfate for neuro protection.  Continuous fetal monitoring.  MFM consult.  Inpatient  admission until delivery.  Check CBC.  Routine labs on admission.  Monitor signs and symptoms of chorea.  Urine sent for culture.  LN/NUANCE  D:06/08/2019 T:06/08/2019 JOB:[id number]

## 2019-06-08 NOTE — Progress Notes (Signed)
Appreciate MFM consult No cramping, no bleeding BP 123/69 (BP Location: Right Arm)   Pulse 93   Temp 98.3 F (36.8 C) (Oral)   Resp 17   Ht 5' 7.5" (1.715 m)   LMP 11/19/2018 (Exact Date)   SpO2 99%   BMI 50.15 kg/m   FHR reassuring, no decels, no contractions H&P dictated

## 2019-06-08 NOTE — Consult Note (Addendum)
Maternal-Fetal Medicine  Name: Sandy Love MRN: [id number] Requesting Provider: Brien Few, MD  I had the pleasure of seeing Sandy Love, who is admitted to [place]. She is G1 P0 at 28w 5d gestation and was admitted after office examination confirmed preterm premature rupture of membranes.   She had rescue cerclage procedure on 04/25/2019 following cervical shortening (3 mm).  Patient continued vaginal progesterone as per our instructions.  Today morning, she noticed watery discharge per vaginum.  Your office examination confirmed PPROM.  Patient does not give history of vaginal bleeding or abdominal cramping or fever.  Since admission she is receiving IV ampicillin and received 1 dose of azithromycin 1 g.  She is also receiving intravenous magnesium sulfate for neuro protection. Past medical history: No history of diabetes or hypertension or any chronic medical conditions.  Past surgical history: Rescue cerclage. Allergies: No known drug allergies. Social history: Denies tobacco drug or alcohol use. Prenatal course: Patient reports she had low risk for fetal aneuploidy seen on screening.  Ultrasound performed 3 days ago showed cephalic presentation.  A growth scan performed 2 weeks ago showed an appropriately grown fetus.  Labs: Hemoglobin 11.6, hematocrit 33.6, WBC 6.9, platelets 215.  Electrolytes normal.  AST 17, ALT 17, creatinine 0.78.  SARS-CoV-2 negative.  Blood type O positive.  I counseled the patient on the following: Preterm premature rupture of membranes (PPROM): -Explained the diagnosis of PPROM, management and timing of delivery. -Antibiotics increases the latency period (rupture-delivery interval). -About 70% or more patients go into spontaneous labor within 1 week. -If expectant management is successful, we recommend delivery at 34 weeks as risk from intraamniotic infection outweighs the neonatal benefits at that gestational age. Expectant management till 37 weeks is  an option that will be discussed later in pregnancy. -Steroids improve neonatal outcome. -Complications of PPROM include intraamniotic infections, fetal infection, placental abruption, fetal demise (cord prolapse).  PPROM and Cerclage:  I informed the patient that no clear recommendations exist on the management of patients with PPROM with cerclage in place. One nonrandomized trial showed increase in the latency period (prolongation of pregnancy). It is also possible that risk of sepsis is increased with cerclage in place. ACOG does not have a clear recommendation on the management and both courses (removal or retention) have been adopted by different physicians.  My recommendation would be NOT to remove the cerclage now till 69 weeks' gestation. I also informed the patient that she can also opt to have the cerclage removed now if she perceives the risk of infection is greater.  Patient opted not to have the cerclage removed. She understands that cerclage will be removed if she goes into active labor or has signs of chorioamnionitis.  Recommendations: -Discontinue vaginal progesterone treatment. -Complete IV course of antibiotics. -After completion of IV ampicillin, start amoxicillin 250 mg PO q 8 hourly for 5 days. -Daily or twice-daily NST -A limited ultrasound tomorrow to check amniotic fluid. -Weekly limited ultrasound. -Fetal growth in 2 weeks. -Weekly BPP from 32 weeks. -Close monitoring for signs and symptoms of maternal infection. -Neonatal consultation. -In the absence of labor, magnesium sulfate infusion may be discontinued.  Thank you for consult. Please do not hesitate to contact me at the [facility] if you have any questions. Consultation including face-to-face counseling 40 minutes.

## 2019-06-09 LAB — URINE CULTURE: Culture: <10000 — AB

## 2019-06-09 NOTE — Progress Notes (Signed)
Patient ID: [name], female   DOB: 10/26/1980, 38 y.o.   MRN: [id number] HD 2 28w 6d PPROM with cerclage  S: Good FM. LOF noted. NO bleeding. No contractions. No CP or SOB Rested well.  O: BP 138/81 (BP Location: Right Arm)   Pulse 93   Temp 98.2 F (36.8 C) (Oral)   Resp 18   Ht 5' 7.5" (1.715 m)   LMP 11/19/2018 (Exact Date)   SpO2 98%   BMI 50.15 kg/m  ]  WDWN AAF NAD NCAT Neck: supple with FROM Lungs: CTA CV: RRR ABD: gravid, obese, nontender No CVAT Neuro: nonfocal Ext: no cords, SCDs intact Skin intact Pelvic deferred  CBC    Component Value Date/Time   WBC 6.9 06/08/2019 1247   RBC 3.81 (L) 06/08/2019 1247   HGB 11.6 (L) 06/08/2019 1247   HCT 33.6 (L) 06/08/2019 1247   PLT 215 06/08/2019 1247   MCV 88.2 06/08/2019 1247   MCH 30.4 06/08/2019 1247   MCHC 34.5 06/08/2019 1247   RDW 13.5 06/08/2019 1247   LYMPHSABS 1.7 01/14/2019 0741   MONOABS 0.5 01/14/2019 0741   EOSABS 0.1 01/14/2019 0741   BASOSABS 0.0 01/14/2019 0741     NST reassuring 120-130s. BTBV 5-25.No decels. Occ accels. NO contractions noted.  A: PPROM at 28w 6d BMI > 40 History of RPL ? CHTN- no history  P: NICU consult BMZ series Watch BP closely Monitor s/s chorio NST tid BPP q week Growth sono next week PPROM abx

## 2019-06-10 NOTE — Progress Notes (Signed)
Patient ID: Sandy Love, female   DOB: 07-May-1981, 38 y.o.   MRN: [id number] HD 3 29w 0d PPROM with cerclage  S: Good FM. LOF noted. NO bleeding. No contractions. No CP or SOB No headaches, scotomata or epigastric pain O: BP 124/68 (BP Location: Right Arm)   Pulse 94   Temp 98.1 F (36.7 C) (Oral)   Resp 18   Ht 5' 7.5" (1.715 m)   LMP 11/19/2018 (Exact Date)   SpO2 100%   BMI 50.15 kg/m  ] Tm 98.7  WDWN AAF NAD NCAT Neck: supple with FROM Lungs: CTA CV: RRR ABD: gravid, obese, nontender No CVAT Neuro: nonfocal Ext: no cords, SCDs intact Skin intact Pelvic deferred  CBC    Component Value Date/Time   WBC 6.9 06/08/2019 1247   RBC 3.81 (L) 06/08/2019 1247   HGB 11.6 (L) 06/08/2019 1247   HCT 33.6 (L) 06/08/2019 1247   PLT 215 06/08/2019 1247   MCV 88.2 06/08/2019 1247   MCH 30.4 06/08/2019 1247   MCHC 34.5 06/08/2019 1247   RDW 13.5 06/08/2019 1247   LYMPHSABS 1.7 01/14/2019 0741   MONOABS 0.5 01/14/2019 0741   EOSABS 0.1 01/14/2019 0741   BASOSABS 0.0 01/14/2019 0741     NST reassuring 130s. BTBV 5-25.Rare variable decels to 120. Occ accels. NO contractions noted.  A: PPROM at 29w 0d BMI > 40 History of RPL ? CHTN- no history  P: NICU consult BMZ series complete Watch BP closely Monitor s/s chorio NST tid BPP q week Growth sono next week PPROM abx

## 2019-06-10 NOTE — Progress Notes (Signed)
Dr Ezzard Flax to review  Strip  Orders  Received

## 2019-06-10 NOTE — Progress Notes (Signed)
Called to review tracing due to deceleration FHR 140-150 , BTBV 5-25, dec in FHR to 120 s x 1-2 min with slow return to baseline FHR now 150s , BTBV 5-25. Occ rare mild variable decel. UI noted Category 1 tracing Monitor additional 85min and dc if stable.

## 2019-06-11 NOTE — Progress Notes (Signed)
Patient ID: Sandy Love, female   DOB: March 25, 1981, 38 y.o.   MRN: [id number] HD 4 29w 1d PPROM with cerclage  S: Good FM. LOF noted. NO bleeding. No contractions. No CP or SOB No headaches, scotomata or epigastric pain O: BP 114/62 (BP Location: Right Wrist)   Pulse 83   Temp 97.8 F (36.6 C) (Oral)   Resp 19   Ht 5' 7.5" (1.715 m)   LMP 11/19/2018 (Exact Date)   SpO2 98%   BMI 50.15 kg/m  ] Tm 98.3  WDWN AAF NAD NCAT Neck: supple with FROM Lungs: CTA CV: RRR ABD: gravid, obese, nontender No CVAT Neuro: nonfocal Ext: no cords, SCDs intact Skin intact Pelvic deferred  CBC    Component Value Date/Time   WBC 6.9 06/08/2019 1247   RBC 3.81 (L) 06/08/2019 1247   HGB 11.6 (L) 06/08/2019 1247   HCT 33.6 (L) 06/08/2019 1247   PLT 215 06/08/2019 1247   MCV 88.2 06/08/2019 1247   MCH 30.4 06/08/2019 1247   MCHC 34.5 06/08/2019 1247   RDW 13.5 06/08/2019 1247   LYMPHSABS 1.7 01/14/2019 0741   MONOABS 0.5 01/14/2019 0741   EOSABS 0.1 01/14/2019 0741   BASOSABS 0.0 01/14/2019 0741     NST reassuring 150s. BTBV 5-25.Rare variable decels to 120. Occ accels. NO contractions noted.  A: PPROM at 29w 1d BMI > 40 History of RPL ? CHTN- no history  P: NICU consult pending BMZ series complete Watch BP closely Monitor s/s chorio NST tid BPP q week Growth sono next week PPROM abx

## 2019-06-11 NOTE — Consult Note (Signed)
Neonatology Consult to Antenatal Patient:  I was asked by Dr. Ronita Hipps to see this patient in order to provide antenatal counseling due to prematurity, 29 1/[redacted] weeks EGA now.  Sandy Love is a very pleasant and positive woman who was admitted 10/19 at 28.[redacted] weeks GA for PROM. She is currently not having active labor. Cerclage remains in place.  She received BMZ and prophylactic IV antibiotics.  GBS + (urine) with pregnancy complicated by obesity and previous fetal losses.    I spoke with the patient. We discussed the worst case of delivery in the next 1-2 days, including usual DR management, possible respiratory complications and need for support, IV access, feedings (mother desires breast feeding, which was encouraged, and use of donor milk if appropriate, of which she agrees), LOS, Mortality and Morbidity, and long term outcomes. I answered questions she had at this time. We would be glad to come back if she has more questions later.  Thank you for asking me to see this patient.  Sandy Sabal [name], MD Neonatologist  The total length of face-to-face or floor/unit time for this encounter was 25 minutes. Counseling and/or coordination of care was 40 minutes of the above.

## 2019-06-11 NOTE — Plan of Care (Signed)
  Problem: Education: Goal: Knowledge of General Education information will improve Description: Including pain rating scale, medication(s)/side effects and non-pharmacologic comfort measures Outcome: Progressing  Will re-contact NICU re: need for neonatology consult as pt's questions are mainly around anticipated NICU admission for baby. Pt. Reports good fetal movement, minimal leaking of fluid, no bleeding. No s/s of PIH.

## 2019-06-11 NOTE — Progress Notes (Signed)
Call to NICU charge RN. Dr. Barbaraann Rondo has been notified of pt's need for NICU consult and is reviewing chart. She will be consulted by MD from NICU today

## 2019-06-11 NOTE — Plan of Care (Signed)
  Problem: Education: Goal: Knowledge of disease or condition will improve Outcome: Progressing   

## 2019-06-12 NOTE — Progress Notes (Signed)
Patient ID: Sandy Love, female   DOB: February 20, 1981, 38 y.o.   MRN: [id number] HD 5 29w 2d PPROM with cerclage  S: Good FM. LOF noted. NO bleeding. No contractions. No CP or SOB No headaches, scotomata or epigastric pain O: BP 126/64 (BP Location: Right Wrist)   Pulse 80   Temp 97.6 F (36.4 C) (Oral)   Resp 18   Ht 5' 7.5" (1.715 m)   LMP 11/19/2018 (Exact Date)   SpO2 100%   BMI 50.15 kg/m  ] Tm 98.3  WDWN AAF NAD NCAT Neck: supple with FROM Lungs: CTA CV: RRR ABD: gravid, obese, nontender No CVAT Neuro: nonfocal Ext: no cords, SCDs intact Skin intact Pelvic deferred  CBC    Component Value Date/Time   WBC 6.9 06/08/2019 1247   RBC 3.81 (L) 06/08/2019 1247   HGB 11.6 (L) 06/08/2019 1247   HCT 33.6 (L) 06/08/2019 1247   PLT 215 06/08/2019 1247   MCV 88.2 06/08/2019 1247   MCH 30.4 06/08/2019 1247   MCHC 34.5 06/08/2019 1247   RDW 13.5 06/08/2019 1247   LYMPHSABS 1.7 01/14/2019 0741   MONOABS 0.5 01/14/2019 0741   EOSABS 0.1 01/14/2019 0741   BASOSABS 0.0 01/14/2019 0741     NST reassuring 150s. BTBV 5-25.Rare variable decels to 120. Occ accels. NO contractions noted.  A: PPROM at 29w 2d BMI > 40 History of RPL ? CHTN- no history  P: NICU consult pending BMZ series complete Watch BP closely Monitor s/s chorio NST tid BPP q week Growth sono next week PPROM abx

## 2019-06-12 NOTE — Plan of Care (Signed)
  Problem: Education: Goal: Knowledge of disease or condition will improve Outcome: Progressing   

## 2019-06-13 ENCOUNTER — Inpatient Hospital Stay (HOSPITAL_COMMUNITY): Payer: 59 | Admitting: Anesthesiology

## 2019-06-13 ENCOUNTER — Inpatient Hospital Stay (HOSPITAL_COMMUNITY): Payer: 59

## 2019-06-13 ENCOUNTER — Encounter (HOSPITAL_COMMUNITY): Payer: Self-pay

## 2019-06-13 ENCOUNTER — Encounter (HOSPITAL_COMMUNITY)
Admission: AD | Disposition: A | Discharge status: Home / Self Care | Payer: Self-pay | Source: Home / Self Care | Attending: Obstetrics and Gynecology

## 2019-06-13 DIAGNOSIS — O411290 Chorioamnionitis, unspecified trimester, not applicable or unspecified: Secondary | ICD-10-CM | POA: Diagnosis present

## 2019-06-13 HISTORY — PX: CERVICAL CERCLAGE: SHX1329

## 2019-06-13 LAB — TYPE AND SCREEN
ABO/RH(D): O POS
Antibody Screen: NEGATIVE

## 2019-06-13 LAB — CBC WITH DIFFERENTIAL/PLATELET
Abs Immature Granulocytes: 0.06 10*3/uL (ref 0.00–0.07)
Basophils Absolute: 0 10*3/uL (ref 0.0–0.1)
Basophils Relative: 0 %
Eosinophils Absolute: 0.1 10*3/uL (ref 0.0–0.5)
Eosinophils Relative: 2 %
HCT: 35.2 % — ABNORMAL LOW (ref 36.0–46.0)
Hemoglobin: 11.6 g/dL — ABNORMAL LOW (ref 12.0–15.0)
Immature Granulocytes: 1 %
Lymphocytes Relative: 16 %
Lymphs Abs: 1.4 10*3/uL (ref 0.7–4.0)
MCH: 29.4 pg (ref 26.0–34.0)
MCHC: 33 g/dL (ref 30.0–36.0)
MCV: 89.3 fL (ref 80.0–100.0)
Monocytes Absolute: 0.7 10*3/uL (ref 0.1–1.0)
Monocytes Relative: 8 %
Neutro Abs: 6.6 10*3/uL (ref 1.7–7.7)
Neutrophils Relative %: 73 %
Platelets: 211 10*3/uL (ref 150–400)
RBC: 3.94 MIL/uL (ref 3.87–5.11)
RDW: 13.6 % (ref 11.5–15.5)
WBC: 8.9 10*3/uL (ref 4.0–10.5)
nRBC: 0 % (ref 0.0–0.2)

## 2019-06-13 SURGERY — CERCLAGE, CERVIX, VAGINAL APPROACH
Anesthesia: Spinal

## 2019-06-13 MED ORDER — OXYCODONE HCL 5 MG PO TABS: 10.0000 mg | ORAL_TABLET | ORAL | Status: DC | PRN

## 2019-06-13 MED ORDER — ACETAMINOPHEN 325 MG PO TABS: 650.0000 mg | ORAL_TABLET | ORAL | Status: DC | PRN

## 2019-06-13 MED ORDER — WITCH HAZEL-GLYCERIN EX PADS: 1.0000 "application " | MEDICATED_PAD | CUTANEOUS | Status: DC | PRN

## 2019-06-13 MED ORDER — DIPHENHYDRAMINE HCL 25 MG PO CAPS: 25.0000 mg | ORAL_CAPSULE | Freq: Four times a day (QID) | ORAL | Status: DC | PRN

## 2019-06-13 MED ORDER — ONDANSETRON HCL 4 MG/2ML IJ SOLN: 4.0000 mg | INTRAMUSCULAR | Status: DC | PRN

## 2019-06-13 MED ORDER — LACTATED RINGERS IV BOLUS
1000.0000 mL | Freq: Once | INTRAVENOUS | Status: AC
  Administered 2019-06-13: 1000 mL via INTRAVENOUS

## 2019-06-13 MED ORDER — BUPIVACAINE IN DEXTROSE 0.75-8.25 % IT SOLN
INTRATHECAL | Status: DC | PRN
  Administered 2019-06-13: 1 mL via INTRATHECAL

## 2019-06-13 MED ORDER — ONDANSETRON HCL 4 MG/2ML IJ SOLN
INTRAMUSCULAR | Status: DC | PRN
  Administered 2019-06-13: 4 mg via INTRAVENOUS

## 2019-06-13 MED ORDER — BUPIVACAINE IN DEXTROSE 0.75-8.25 % IT SOLN
INTRATHECAL | Status: AC
  Filled 2019-06-13: qty 2

## 2019-06-13 MED ORDER — DIBUCAINE (PERIANAL) 1 % EX OINT: 1.0000 "application " | TOPICAL_OINTMENT | CUTANEOUS | Status: DC | PRN

## 2019-06-13 MED ORDER — ONDANSETRON HCL 4 MG PO TABS: 4.0000 mg | ORAL_TABLET | ORAL | Status: DC | PRN

## 2019-06-13 MED ORDER — SENNOSIDES-DOCUSATE SODIUM 8.6-50 MG PO TABS
2.0000 | ORAL_TABLET | ORAL | Status: DC
  Administered 2019-06-13 – 2019-06-15 (×2): 2 via ORAL
  Filled 2019-06-13 (×2): qty 2

## 2019-06-13 MED ORDER — LACTATED RINGERS IV SOLN
INTRAVENOUS | Status: DC | PRN
  Administered 2019-06-13 (×2): via INTRAVENOUS

## 2019-06-13 MED ORDER — GENTAMICIN SULFATE 40 MG/ML IJ SOLN
5.0000 mg/kg | INTRAVENOUS | Status: DC
  Administered 2019-06-13 – 2019-06-14 (×2): 480 mg via INTRAVENOUS
  Filled 2019-06-13 (×3): qty 12

## 2019-06-13 MED ORDER — ACETAMINOPHEN 500 MG PO TABS
1000.0000 mg | ORAL_TABLET | Freq: Four times a day (QID) | ORAL | Status: DC | PRN
  Administered 2019-06-13: 1000 mg via ORAL
  Filled 2019-06-13: qty 2

## 2019-06-13 MED ORDER — COCONUT OIL OIL: 1.0000 "application " | TOPICAL_OIL | Status: DC | PRN

## 2019-06-13 MED ORDER — OXYCODONE HCL 5 MG PO TABS: 5.0000 mg | ORAL_TABLET | ORAL | Status: DC | PRN

## 2019-06-13 MED ORDER — FENTANYL CITRATE (PF) 100 MCG/2ML IJ SOLN
100.0000 ug | Freq: Once | INTRAMUSCULAR | Status: AC
  Administered 2019-06-13: 100 ug via INTRAVENOUS
  Filled 2019-06-13: qty 2

## 2019-06-13 MED ORDER — ONDANSETRON HCL 4 MG/2ML IJ SOLN
INTRAMUSCULAR | Status: AC
  Filled 2019-06-13: qty 2

## 2019-06-13 MED ORDER — BENZOCAINE-MENTHOL 20-0.5 % EX AERO: 1.0000 "application " | INHALATION_SPRAY | CUTANEOUS | Status: DC | PRN

## 2019-06-13 MED ORDER — ZOLPIDEM TARTRATE 5 MG PO TABS: 5.0000 mg | ORAL_TABLET | Freq: Every evening | ORAL | Status: DC | PRN

## 2019-06-13 MED ORDER — CLINDAMYCIN PHOSPHATE 900 MG/50ML IV SOLN
900.0000 mg | Freq: Three times a day (TID) | INTRAVENOUS | Status: DC
  Administered 2019-06-13 – 2019-06-15 (×6): 900 mg via INTRAVENOUS
  Filled 2019-06-13 (×6): qty 50

## 2019-06-13 MED ORDER — TETANUS-DIPHTH-ACELL PERTUSSIS 5-2.5-18.5 LF-MCG/0.5 IM SUSP: 0.5000 mL | Freq: Once | INTRAMUSCULAR | Status: DC

## 2019-06-13 MED ORDER — TERBUTALINE SULFATE 1 MG/ML IJ SOLN
0.2500 mg | Freq: Once | INTRAMUSCULAR | Status: AC
  Administered 2019-06-13: 0.25 mg via SUBCUTANEOUS
  Filled 2019-06-13: qty 1

## 2019-06-13 MED ORDER — PRENATAL MULTIVITAMIN CH
1.0000 | ORAL_TABLET | Freq: Every day | ORAL | Status: DC
  Administered 2019-06-14: 1 via ORAL
  Filled 2019-06-13 (×2): qty 1

## 2019-06-13 MED ORDER — IBUPROFEN 600 MG PO TABS
600.0000 mg | ORAL_TABLET | Freq: Four times a day (QID) | ORAL | Status: DC
  Administered 2019-06-13 – 2019-06-15 (×8): 600 mg via ORAL
  Filled 2019-06-13 (×9): qty 1

## 2019-06-13 MED ORDER — SIMETHICONE 80 MG PO CHEW: 80.0000 mg | CHEWABLE_TABLET | ORAL | Status: DC | PRN

## 2019-06-13 SURGICAL SUPPLY — 19 items
CANISTER SUCT 3000ML PPV (MISCELLANEOUS) ×2 IMPLANT
CATH FOLEY LATEX FREE 14FR (CATHETERS) ×1
CATH FOLEY LF 14FR (CATHETERS) ×1 IMPLANT
GLOVE BIOGEL M 6.5 STRL (GLOVE) ×4 IMPLANT
GLOVE BIOGEL PI IND STRL 7.0 (GLOVE) ×2 IMPLANT
GLOVE BIOGEL PI INDICATOR 7.0 (GLOVE) ×2
GOWN STRL REUS W/TWL LRG LVL3 (GOWN DISPOSABLE) ×4 IMPLANT
NEEDLE MAYO CATGUT SZ4 (NEEDLE) ×2 IMPLANT
PACK VAGINAL MINOR WOMEN LF (CUSTOM PROCEDURE TRAY) ×2 IMPLANT
PAD OB MATERNITY 4.3X12.25 (PERSONAL CARE ITEMS) ×2 IMPLANT
PAD PREP 24X48 CUFFED NSTRL (MISCELLANEOUS) ×2 IMPLANT
SPONGE LAP 18X18 RF (DISPOSABLE) ×2 IMPLANT
SUT ETHIBOND  5 (SUTURE) ×1
SUT ETHIBOND 5 (SUTURE) ×1 IMPLANT
SUT PROLENE 0 CT 1 30 (SUTURE) ×2 IMPLANT
TOWEL OR 17X24 6PK STRL BLUE (TOWEL DISPOSABLE) ×4 IMPLANT
TRAY FOLEY W/BAG SLVR 14FR (SET/KITS/TRAYS/PACK) ×2 IMPLANT
TUBING NON-CON 1/4 X 20 CONN (TUBING) IMPLANT
YANKAUER SUCT BULB TIP NO VENT (SUCTIONS) IMPLANT

## 2019-06-13 NOTE — Op Note (Signed)
06/13/2019  3:27 PM  PATIENT:  Sandy Love  38 y.o. female  PRE-OPERATIVE DIAGNOSIS: Active labor at 29 weeks, presence of cerclage  POST-OPERATIVE DIAGNOSIS: Active labor at 29 weeks, presence of cervical cerclage  PROCEDURE:  Procedure(s): CERCLAGE REMOVAL (N/A) :  Removal of Cervical Cerclage and Delivery of 29 week baby  SURGEON:  Surgeon(s) and Role:    * Aloha Gell, MD - Primary    * Woodroe Mode, MD - Assisting  PHYSICIAN ASSISTANT: None  ASSISTANTS: As above  ANESTHESIA:   spinal  EBL:  150 mL   BLOOD ADMINISTERED:none  DRAINS: Urinary Catheter (Foley)   LOCAL MEDICATIONS USED:  NONE  SPECIMEN:  Source of Specimen:  Placenta, cord pH  DISPOSITION OF SPECIMEN:  PATHOLOGY  COUNTS:  YES  TOURNIQUET:  * No tourniquets in log *  DICTATION: .Note written in EPIC  PLAN OF CARE: Admit to inpatient   PATIENT DISPOSITION:  PACU - hemodynamically stable.   Delay start of Pharmacological VTE agent (>24hrs) due to surgical blood loss or risk of bleeding: yes  Indications: 38 year old G5, P0 at 29 weeks and 3 days who has been in-house for approximately 1 week after she had preterm premature rupture of membranes in the setting of a emergency cerclage.  She received betamethasone about 1 week ago on admission and magnesium sulfate.  She was started on latency antibiotics.  This morning she had acute onset of contractions with slight increase in baseline fetal heart tones to 160s.  White count was normal.  Contractions significantly increased over the next hour despite IV fluids.  Patient then began to have very intense labor contractions.  Bedside ultrasound revealed vertex presentation and digital exam confirmed vertex presentation cervix 1.5 cm dilated but 90% effaced.  Patient was given fentanyl and terbutaline for comfort and given the active labor the decision was made to proceed with cerclage removal and expectant management.  Decision was made to not give  any tocolytics given that she is already steroid complete and risks of infection with P PROM and cerclage.  Procedure.  Patient was taking to the operating room for combined spinal epidural for immediate comfort for cerclage removal and to help with visualization.  The plan was then to put the patient into the labor floor for expectant management.  During the spinal placement patient was very uncomfortable and stating that "the baby was coming".  Combined spinal epidural was placed quickly and without apparent complication.  Patient was laid back put in lithotomy position and fetal vertex was seen at the introitus.  Decision was made to proceed with vaginal delivery.  NICU was called.   Spontaneous vaginal delivery: Spinal anesthesia.  Bladder had not been drained.  3 pushes over 2 contractions and spontaneous delivery of a baby boy.  Respiratory effort and good tone noted and  baby placed on mom's stomach.  Once NICU was in attendance Cord was clamped and cut.  This was approximately 60 seconds.  Baby was handed off to peds.  Cervical examination was done to evaluate the cerclage.  The cerclage was attached to the posterior cervical wall but was not circumferentially around the cervical os.  Placenta was was delivered spontaneous and complete.  Pitocin was started.  No vaginal lacerations was needed no repairs were done.   Using weighted speculum's and vaginal wall retractors cervix was identified.  It was walked with a ring forcep.  A small anterior hemostatic tear was noted on the cervix at 1:00.  This was less than 1 cm from the external os.  No additional cervical lacerations were noted.  The cerclage was noted on the inferior edge of the external os.  No stay suture was noted.  Op note had been reviewed prior and it did not appear stay suture was placed.  Appeared that only a Mersilene tape was placed.  No knot count was included in the operative note.  The Mersilene tape was encountered and cut.  The  entire cerclage was removed intact.  The cervix was evaluated again.  No bleeding was noted,  no lacerations that needed repair were noted.    The procedure was terminated and the patient was taken to recovery room in stable condition.  Sponge lap and needle counts were correct x3.  Lendon Colonel 06/13/2019 3:52 PM

## 2019-06-13 NOTE — Anesthesia Preprocedure Evaluation (Addendum)
Anesthesia Evaluation  Patient identified by MRN, date of birth, ID band Patient awake    Reviewed: Allergy & Precautions, NPO status , Patient's Chart, lab work & pertinent test results  History of Anesthesia Complications Negative for: history of anesthetic complications  Airway Mallampati: I  TM Distance: >3 FB Neck ROM: Full    Dental no notable dental hx. (+) Teeth Intact   Pulmonary neg pulmonary ROS, former smoker,    Pulmonary exam normal        Cardiovascular negative cardio ROS Normal cardiovascular exam Rhythm:Regular Rate:Tachycardia     Neuro/Psych negative neurological ROS  negative psych ROS   GI/Hepatic negative GI ROS, Neg liver ROS,   Endo/Other  Morbid obesity  Renal/GU negative Renal ROS  negative genitourinary   Musculoskeletal negative musculoskeletal ROS (+)   Abdominal (+) + obese,   Peds  Hematology  (+) Blood dyscrasia, anemia ,   Anesthesia Other Findings BMI 50  Reproductive/Obstetrics (+) Pregnancy                             Anesthesia Physical  Anesthesia Plan  ASA: III  Anesthesia Plan: Combined Spinal and Epidural   Post-op Pain Management:    Induction:   PONV Risk Score and Plan: 3 and Treatment may vary due to age or medical condition and Ondansetron  Airway Management Planned: Natural Airway and Nasal Cannula  Additional Equipment: None  Intra-op Plan:   Post-operative Plan:   Informed Consent: I have reviewed the patients History and Physical, chart, labs and discussed the procedure including the risks, benefits and alternatives for the proposed anesthesia with the patient or authorized representative who has indicated his/her understanding and acceptance.       Plan Discussed with: CRNA  Anesthesia Plan Comments:        Anesthesia Quick Evaluation

## 2019-06-13 NOTE — Transfer of Care (Signed)
Immediate Anesthesia Transfer of Care Note  Patient: Sandy Love  Procedure(s) Performed: CERCLAGE REMOVAL (N/A )  Patient Location: PACU  Anesthesia Type:Spinal  Level of Consciousness: awake, alert  and oriented  Airway & Oxygen Therapy: Patient Spontanous Breathing  Post-op Assessment: Report given to RN  Post vital signs: Reviewed and stable  Last Vitals:  Vitals Value Taken Time  BP 120/70 06/13/19 1450  Temp    Pulse 112 06/13/19 1451  Resp 18 06/13/19 1451  SpO2 95 % 06/13/19 1451  Vitals shown include unvalidated device data.  Last Pain:  Vitals:   06/13/19 1140  TempSrc: Oral  PainSc:       Patients Stated Pain Goal: 3 (63/87/56 4332)  Complications: No apparent anesthesia complications

## 2019-06-13 NOTE — Anesthesia Procedure Notes (Signed)
Spinal  Patient location during procedure: OR Start time: 06/13/2019 2:09 PM End time: 06/13/2019 2:13 PM Staffing Anesthesiologist: Lyn Hollingshead, MD Performed: anesthesiologist  Preanesthetic Checklist Completed: patient identified, site marked, surgical consent, pre-op evaluation, timeout performed, IV checked, risks and benefits discussed and monitors and equipment checked Spinal Block Patient position: sitting Prep: DuraPrep Patient monitoring: cardiac monitor, continuous pulse ox, blood pressure and heart rate Approach: midline Location: L3-4 Injection technique: catheter Needle Needle type: Tuohy and Sprotte  Needle gauge: 24 G Needle length: 12.7 cm Catheter type: closed end flexible Catheter size: 19 g Assessment Sensory level: T10

## 2019-06-13 NOTE — Progress Notes (Signed)
Chief complaint: Fever  Subjective: Patient notes nausea x1.  Patient states shivering since delivery but denies chest pain or shortness of breath.  Patient notes no abdominal pain.  Lochia appropriate  Objective: Vitals with BMI 06/13/2019 06/13/2019 06/13/2019  Height - - -  Weight - - -  BMI - - -  Systolic 438 - 381  Diastolic 66 - 60  Pulse - 124 125   Temp reported by nurse now 103 General: Appears slightly anxious but no distress Abdomen: Obese, no fundal tenderness appreciated GU: Appropriate lochia  Assessment and plan postpartum from precipitous delivery and cervical cerclage removal at 29 weeks and 3 days.  Likely etiology of labor was chorioamnionitis and patient now showing fever and tachycardia.  Will treat for chorioamnionitis with gentamicin and clindamycin.  Ala Dach 06/13/2019 4:59 PM

## 2019-06-13 NOTE — Progress Notes (Signed)
Epidural removed according to [facility] policy. Upon removal, blue tip of catheter intact, hemostasis achieved and bandaid placed to lower back. Pt has no c/o pain and sensation is intact.

## 2019-06-13 NOTE — Progress Notes (Signed)
Dr Fogleman at bedside.  

## 2019-06-13 NOTE — Progress Notes (Signed)
Pt screaming out in pain stating that she could not tolerate all of the pressure. Dr. Pamala Hurry not in department, so Dr. Roselie Awkward from [facility] was notified to come examine her until Dr. Pamala Hurry could make it in.

## 2019-06-13 NOTE — Progress Notes (Signed)
Dr Pamala Hurry updated on BP's and pulse assessment on pt while in PACU. Will come and see pt. OK to DC foley.

## 2019-06-13 NOTE — Anesthesia Postprocedure Evaluation (Addendum)
Anesthesia Post Note  Patient: Sandy Love  Procedure(s) Performed: CERCLAGE REMOVAL (N/A )     Patient location during evaluation: PACU Anesthesia Type: Combined Spinal/Epidural Level of consciousness: awake Pain management: pain level controlled Vital Signs Assessment: post-procedure vital signs reviewed and stable Respiratory status: spontaneous breathing Cardiovascular status: tachycardic Postop Assessment: no headache, no backache, spinal receding, patient able to bend at knees and no apparent nausea or vomiting Anesthetic complications: no Comments: Dr. Pamala Hurry is aware that the patients tachypnea and tachycardia are due to a significant fever which is being treated .    Last Vitals:  Vitals:   06/13/19 1653 06/13/19 1840  BP: 128/66   Pulse:    Resp:    Temp: (!) 39.4 C 37.5 C  SpO2:      Last Pain:  Vitals:   06/13/19 1840  TempSrc: Oral  PainSc:    Pain Goal: Patients Stated Pain Goal: 3 (06/12/19 2000)                 Huston Foley

## 2019-06-13 NOTE — Brief Op Note (Signed)
06/13/2019  3:27 PM  PATIENT:  Sandy Love  38 y.o. female  PRE-OPERATIVE DIAGNOSIS: Active labor at 29 weeks, presence of cerclage  POST-OPERATIVE DIAGNOSIS: Active labor at 29 weeks, presence of cervical cerclage  PROCEDURE:  Procedure(s): CERCLAGE REMOVAL (N/A) :  Removal of Cervical Cerclage and Delivery of 29 week baby  SURGEON:  Surgeon(s) and Role:    * Aloha Gell, MD - Primary    * Woodroe Mode, MD - Assisting  PHYSICIAN ASSISTANT: None  ASSISTANTS: As above  ANESTHESIA:   spinal  EBL:  150 mL   BLOOD ADMINISTERED:none  DRAINS: Urinary Catheter (Foley)   LOCAL MEDICATIONS USED:  NONE  SPECIMEN:  Source of Specimen:  Placenta, cord pH  DISPOSITION OF SPECIMEN:  PATHOLOGY  COUNTS:  YES  TOURNIQUET:  * No tourniquets in log *  DICTATION: .Note written in EPIC  PLAN OF CARE: Admit to inpatient   PATIENT DISPOSITION:  PACU - hemodynamically stable.   Delay start of Pharmacological VTE agent (>24hrs) due to surgical blood loss or risk of bleeding: yes

## 2019-06-13 NOTE — Progress Notes (Signed)
Pt in tears and breathing through ctx when RN entered room. Notified Dr. Pamala Hurry. Received verbal orders for ultrasound. No other orders at this time. Dr. Pamala Hurry on her way to department.

## 2019-06-13 NOTE — Progress Notes (Addendum)
Pt gripping side rails and screaming out that she was feeling severe pressure in her vaginal area. Notified Dr. Pamala Hurry. Received orders to give terbutaline .25mg  and 100 mcg fentanyl. Will continue to monitor.

## 2019-06-13 NOTE — Progress Notes (Signed)
Pt still with epidural catheter in place. Dr. Jillyn Hidden called and MD said epidural can be discontinued.

## 2019-06-13 NOTE — Progress Notes (Signed)
Dr. Pamala Hurry notified of elevated FHR and minimal variability. Received verbal order to leave the pt on the monitor until baby starts looking reactive. Also received verbal order to get a CBC w/ differential. Will continue to monitor.

## 2019-06-13 NOTE — Progress Notes (Addendum)
Dr. Roselie Awkward at bedside. Sterile vaginal exam was performed. Bedside ultrasound performed and baby was confirmed to be vertex.

## 2019-06-14 ENCOUNTER — Encounter (HOSPITAL_COMMUNITY): Payer: Self-pay | Admitting: *Deleted

## 2019-06-14 LAB — CBC WITH DIFFERENTIAL/PLATELET
Abs Immature Granulocytes: 0.05 10*3/uL (ref 0.00–0.07)
Basophils Absolute: 0 10*3/uL (ref 0.0–0.1)
Basophils Relative: 0 %
Eosinophils Absolute: 0.1 10*3/uL (ref 0.0–0.5)
Eosinophils Relative: 1 %
HCT: 33.1 % — ABNORMAL LOW (ref 36.0–46.0)
Hemoglobin: 10.6 g/dL — ABNORMAL LOW (ref 12.0–15.0)
Immature Granulocytes: 1 %
Lymphocytes Relative: 8 %
Lymphs Abs: 0.8 10*3/uL (ref 0.7–4.0)
MCH: 29 pg (ref 26.0–34.0)
MCHC: 32 g/dL (ref 30.0–36.0)
MCV: 90.7 fL (ref 80.0–100.0)
Monocytes Absolute: 0.6 10*3/uL (ref 0.1–1.0)
Monocytes Relative: 6 %
Neutro Abs: 8.1 10*3/uL — ABNORMAL HIGH (ref 1.7–7.7)
Neutrophils Relative %: 84 %
Platelets: 179 10*3/uL (ref 150–400)
RBC: 3.65 MIL/uL — ABNORMAL LOW (ref 3.87–5.11)
RDW: 13.9 % (ref 11.5–15.5)
WBC: 9.6 10*3/uL (ref 4.0–10.5)
nRBC: 0 % (ref 0.0–0.2)

## 2019-06-14 NOTE — Lactation Note (Signed)
This note was copied from a baby's chart. Lactation Consultation Note  Patient Name: Sandy Love BULAG'T Date: 06/14/2019 Reason for consult: Initial assessment;Primapara;NICU baby;Preterm <34wks;Infant < 6lbs P1 delivery of 29 week infant in NICU who is now 42 hours old. Mom initiated pumping yesterday.  Reviewed NICU Booklet and gave to mom. Reviewed [facility]ation [facility] Handout. 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 Mom did not have her insurance card with her, so verified information on computer of her card. Mom has lots of questions.  Including flange fit.  Reported tried to measure her on nipples with a measuring tape. Measured moms left nipple and it measured 16 mm so gave mom 21 mm flanges. Mom has condensation in tubing so let tubing run and showed mom how to clean pump parts.  Mom wanted Medela Freestyle flex with her [employer] insurance.  Gave mom Medela Freestyle Flex pump through her [employer] insur.  Plan: Mom plans to pump 8-12 times day for 15 minutes  Maternal Data Has patient been taught Hand Expression?: No Does the patient have breastfeeding experience prior to this delivery?: No  Feeding    LATCH Score                   Interventions Interventions: Breast massage;Hand express;Expressed milk;DEBP  Lactation Tools Discussed/Used WIC Program: No Pump Review: Setup, frequency, and cleaning;Milk Storage Initiated by:: RN Date initiated:: 06/13/19   Consult Status Consult Status: Follow-up Date: 06/15/19 Follow-up type: In-patient    Sandy Love 06/14/2019, 3:08 PM

## 2019-06-14 NOTE — Progress Notes (Signed)
Post Partum Day 1 S/P spontaneous vaginal and cerclage removal.  Complicated by chorioamnionitis.  Currently on gentamicin and clindamycin  Feeding: Baby n.p.o. but patient pumping Subjective: No HA, SOB, CP, F/C, breast symptoms. Normal vaginal bleeding, no clots. Pain controlled.  ambulating without symptoms.  voiding without difficulty    Objective: BP (!) 125/97 (BP Location: Right Arm)   Pulse (!) 103   Temp 98.4 F (36.9 C) (Oral)   Resp 18   Ht 5' 7.5" (1.715 m)   Wt (!) 146.1 kg   LMP 11/19/2018 (Exact Date)   SpO2 100%   Breastfeeding Unknown   BMI 49.69 kg/m  I&O reviewed.   Physical Exam:  General: alert and cooperative Lochia: appropriate Uterine Fundus: Firm, at umbilicus and mildly tender DVT Evaluation: No evidence of DVT seen on physical exam. Ext: No c/c/e Recent Labs    06/13/19 1130 06/14/19 1149  HGB 11.6* 10.6*  HCT 35.2* 33.1*      Assessment/Plan: 38 y.o.  PPD #1 .  normal postpartum exam Chorioamnionitis.  Still with mild tenderness.  We will continue gentamicin and clindamycin and reassess in a.m.  If patient clinically improved we will stop antibiotics and patient is a candidate for discharge.  She plans to room in with baby in the NICU. Continue current postpartum care Ambulate   LOS: 6 days   Ala Dach 06/14/2019 1:00 PM

## 2019-06-15 MED ORDER — ACETAMINOPHEN 325 MG PO TABS: 650.0000 mg | ORAL_TABLET | ORAL | Status: DC | PRN

## 2019-06-15 MED ORDER — IBUPROFEN 600 MG PO TABS: 600.0000 mg | ORAL_TABLET | Freq: Four times a day (QID) | ORAL | 0 refills | Status: DC

## 2019-06-15 MED FILL — VITAFOL GUMMIES: 3.33-0.333- | 30 days supply | Qty: 90 | Fill #2

## 2019-06-15 MED FILL — IBUPROFEN 600 MG TABLET: 600 | 7 days supply | Qty: 30 | Fill #0

## 2019-06-15 NOTE — Lactation Note (Addendum)
This note was copied from a baby's chart. Lactation Consultation Note  Patient Name: Sandy Love UNMMG'Y Date: 06/15/2019 Reason for consult: Follow-up assessment;Primapara;1st time breastfeeding;NICU baby;Preterm <34wks;Infant < 6lbs;Other (Comment)(mom for D/C - has DEBP - Medela)  Baby is 45 hours  Per mom has pumped x 4 in the last 24 hours and is getting more hand expressing.  LC and orientee reviewed hand expressing the 2-3 drops.  LC also checked mom pumping with the DEBP - Medela .  LC had mom allow her breast hang naturally so the flange would fit better and increase stimulation. LC assessed #21 flange , #24 F and a #27 Flange and felt the #24 flange was the best fit to accommodate the areola and stimulate.  Mom denies sore nipples. Sore nipples and engorgement prevention and tx Reviewed.  Per mom has a pump - DEBP Medela  And is aware to leaver her DEBP Medela kit for pumping in NICU in front if the baby .  LC recommended and encouraged mom to warm moist heat to her breast , breast massage , hand express, pump.  LC reviewed resources for [facility] resources and reminded mom when she is able  To put baby to the breast to either call or have the NICU RN call for appt.  LC provided the booklet for providing breast milk for baby in NICU.     Maternal Data Has patient been taught Hand Expression?: Yes  Feeding Feeding Type: Donor Breast Milk  LATCH Score                   Interventions Interventions: Breast feeding basics reviewed;Shells;DEBP  Lactation Tools Discussed/Used Tools: Shells;Pump;Flanges Flange Size: 24;21;27(#24 F fit the best) Shell Type: Inverted Breast pump type: Double-Electric Breast Pump Pump Review: Milk Storage;Setup, frequency, and cleaning Initiated by:: MAI - review   Consult Status Consult Status: PRN Date: (baby in NICU) Follow-up type: In-patient    [place] 06/15/2019, 11:16 AM

## 2019-06-15 NOTE — Progress Notes (Signed)
Discharge instructions reviewed with patient. Discussed postpartum care, follow up appointment and medication changes. Reviewed admin of tylenol and motrin. Breast care discussed.  Patient verbalized understanding. IV removed. Patient discharged to NICU.

## 2019-06-15 NOTE — Progress Notes (Signed)
PPD #2, S/p Preterm delivery, cerclage removal.  Complicated by chorioamnionitis.    Subjective: Feels well, no f/c/n/v. Vag bleeding minimal  Objective: BP (!) 148/90 (BP Location: Right Arm) Comment: notified nurse  Pulse 99   Temp 98.1 F (36.7 C) (Oral)   Resp 18   Ht 5' 7.5" (1.715 m)   Wt (!) 146.1 kg   LMP 11/19/2018 (Exact Date)   SpO2 100%   Breastfeeding Unknown   BMI 49.69 kg/m    Physical Exam:  General: alert and cooperative Lochia: appropriate Uterine Fundus: Firm, at umbilicus and mildly tender DVT Evaluation: No evidence of DVT seen on physical exam. Ext: No c/c/   Assessment/Plan: 38 y.o.  PPD # 2 .  normal postpartum exa Chorioamnionitis- resolved.  Routine PP care, breast care, warning s/s and reportable s/s reviewed. Pt has great support and will call prn, see Dr Ronita Hipps in 6 wks.  Baby remains stable in NICU   LOS: 7 days   Sandy Love 06/15/2019 1:19 PM

## 2019-06-15 NOTE — Lactation Note (Signed)
This note was copied from a baby's chart. Lactation Consultation Note Attempted to see mom 2 separate times. 1st time mom was in the NICU, 2nd time mom was sleeping.  Patient Name: Boy Marionette Meskill LPNPY'Y Date: 06/15/2019     Maternal Data    Feeding    LATCH Score                   Interventions    Lactation Tools Discussed/Used     Consult Status      Gershom Brobeck, Elta Guadel[place] 06/15/2019, 6:01 AM

## 2019-06-15 NOTE — Discharge Summary (Signed)
Obstetric Discharge Summary Reason for Admission: 06/08/19 - PPROM.  28.5 wks (Admitting MD Dr Ronita Hipps). H/o rescue cerclage on 04/25/19 Prenatal Procedures: serial CL sono and rescue cerclage on 04/25/19 Intrapartum Procedures: Cerclage removal and precipitous delivery on 06/13/19 at 29.3 wks. NICU team in room. Patient delivered in OR through cerclage (Dr Pamala Hurry) and cerclage removed after delivery.  Postpartum Procedures: Gentamicin/ Clindamycin for 48hrs for chorioamnionitis Complications-Operative and Postpartum: none Hemoglobin  Date Value Ref Range Status  06/14/2019 10.6 (L) 12.0 - 15.0 g/dL Final   HCT  Date Value Ref Range Status  06/14/2019 33.1 (L) 36.0 - 46.0 % Final    Physical Exam:  General: alert and cooperative Lochia: appropriate Uterine Fundus: firm, non tender  Incision: none  DVT Evaluation: No evidence of DVT seen on physical exam.  Discharge Diagnoses: Preterm vaginal delivery at 29.3 wks. Chorioamnionitis. Rescue cerclage removed after delivery. Cervix was exploired and intact per delivering provider, Dr Orvilla Fus  Discharge Information: Date: 06/15/2019 Activity: pelvic rest Diet: routine Medications: PNV and Ibuprofen Condition: stable Instructions: refer to practice specific booklet Discharge to: home Follow-up Information    Brien Few, MD Follow up in 6 week(s).   Specialty: Obstetrics and Gynecology Contact information: [place] [address] [phone]           Newborn Data: Live born female  Birth Weight: 2 lb 12.1 oz (1250 g) APGAR: 7, 8  Newborn Delivery   Birth date/time: 06/13/2019 14:15:00 Delivery type: Vaginal, Spontaneous      Home with stable in NICU.  Sandy Love 06/15/2019, 1:21 PM

## 2019-06-15 NOTE — Clinical Social Work Maternal (Signed)
CLINICAL SOCIAL WORK MATERNAL/CHILD NOTE  Patient Details  Name: Sandy Love MRN: [id number] Date of Birth: [birth year]  Date:  06/15/2019  Clinical Social Worker Initiating Note:  Abundio Miu, [place] Date/Time: Initiated:  06/15/19/1140     Child's Name:  Sandy Love   Biological Parents:  Mother   Need for Interpreter:  None   Reason for Referral:  Parental Support of Premature Babies < 65 weeks/or Critically Ill babies, Behavioral Health Concerns(Edinburgh score 10)   Address:  [address] [place] [address]    Phone number:  [phone] (home)     Additional phone number:   Household Members/Support Persons (HM/SP):       HM/SP Name Relationship DOB or Age  HM/SP -1        HM/SP -2        HM/SP -3        HM/SP -4        HM/SP -5        HM/SP -6        HM/SP -7        HM/SP -8          Natural Supports (not living in the home):  Parent, Immediate Family   Professional Supports: None   Employment: Full-time   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 Rudi Coco)  [place]      Pediatrician Fax Number:    Risk Factors/Current Problems:  None   Cognitive State:  Able to Concentrate , Alert , Goal Oriented , Insightful , Linear Thinking    Mood/Affect:  Calm , Interested , Relaxed , Comfortable    Sandy Love Assessment: Sandy Love met with Sandy Love at bedside to discuss infant's NICU admission and behavioral  health concerns (edinburgh score 10). Sandy Love introduced self and explained reason for consult. Sandy Love was welcoming, pleasant, polite and engaged during assessment. 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 Sandy Love reported that she has not started to shop for infant and planned on starting at 30 weeks. Sandy Love informed Sandy Love about Family Support Network and assistance obtaining items for infant if needed. Sandy Love verbalized plan to apply for [facility]. Sandy Love inquired about Sandy Love's support system, Sandy Love reported that her mother who lives in [place] and siblings are her supports. Sandy Love reported that her siblings are both local and living in [place]. Sandy Love reported that her mother is excited to come down to visit infant.   Sandy Love inquired about Sandy Love's mental health history, Sandy Love denied any mental health history. Sandy Love and Sandy Love discussed Sandy Love's edinburgh score 10. Sandy Love reported that she was stressed and overwhelmed over the past seven days after her water broke. Sandy Love reported that she was worried about everything. Sandy Love acknowledged and validated Sandy Love's feelings. Sandy Love inquired about how Sandy Love was feeling emotionally now, Sandy Love reported that  she felt like a roller coaster. Sandy Love reported that she had a breakdown yesterday after hearing infant cry for the first time. Sandy Love normalized Sandy Love's feelings and informed Sandy Love that she may continue to have a roller coaster of emotions because the NICU is a journey. Sandy Love verbalized understanding and reported that she is happy that they have made it this far and that this is her "miracle baby". Sandy Love positively affirmed Sandy Love's optimism. Sandy Love presented calm and did not demonstrate any acute mental health signs/symptoms. Sandy Love assessed for safety, Sandy Love denied SI, HI and domestic violence.   Sandy Love provided education regarding the baby blues period vs. perinatal mood disorders, discussed treatment and gave resources for mental health follow up if concerns arise.  Sandy Love recommends self-evaluation during  the postpartum time period using the New Mom Checklist from Postpartum Progress and encouraged Sandy Love to contact a medical professional if symptoms are noted at any time.    Sandy Love provided review of Sudden Infant Death Syndrome (SIDS) precautions.    Sandy Love and Sandy Love discussed infant's NICU admission. Sandy Love informed Sandy Love about the NICU, what to expect and resources/supports available while infant is admitted to the NICU. Sandy Love reported that she feels well informed about infant's care and that meal vouchers and a gas card would be helpful. Sandy Love provided Sandy Love with a gas card and 3 meal vouchers. Sandy Love denied any other needs/concerns. Sandy Love encouraged Sandy Love to contact Sandy Love if any needs/concerns arise.   Sandy Love will continue to offer resources/supports while infant is admitted to the NICU.   Sandy Love Plan/Description:  Sudden Infant Death Syndrome (SIDS) Education, Perinatal Mood and Anxiety Disorder (PMADs) Education, Other Information/Referral to [employer], Other Patient/Family Education    Sandy Medin, LCSW 06/15/2019, 4:56 PM

## 2019-06-16 LAB — SURGICAL PATHOLOGY

## 2019-07-06 ENCOUNTER — Encounter: Payer: 59 | Admitting: Family Medicine

## 2019-07-30 MED FILL — NAPROXEN 250 MG TABLET: 250 | 6 days supply | Qty: 20 | Fill #0

## 2019-08-06 ENCOUNTER — Ambulatory Visit: Payer: Self-pay

## 2019-08-06 NOTE — Lactation Note (Signed)
This note was copied from a baby's chart. Lactation Consultation Note  Patient Name: Sandy Love [id number] Date: 08/06/2019   I dropped by NICU to help Mom trouble-shoot issues with her DEBP. Mom not in room, but Vicente Males, RN will notify me when she arrives. Infant to be d/c'd today.   Matthias Hughs [facility] 08/06/2019, 8:08 AM

## 2019-08-06 NOTE — Lactation Note (Signed)
This note was copied from a baby's chart. Lactation Consultation Note  Patient Name: Boy Anzlee Hinesley [id number]'[id number] Date: 08/06/2019   I visited with Mom and observed her pump. Her L breast moved back and well within the flange well, but her R breast did not. I switched flange/bottle sets & found the same. On palpation, I noted that Mom's L areola was soft. Her R areola seemed congested. Hand expression was taught with great success. The pump was reapplied & Mom's R breast was now able to move back & forth & milk continued to be expressed. Mom was very, very pleased.   Mom understands that if she notices that her areola feel congested & are not moving well into the flange, she can do hand expression to soften & then reapply pump.   Mom had not been aware that she was using 2 different flange sizes: a 21 & a 24. This was pointed out to her. Size 21 flanges are appropriate for her at this time. Mom plans to increase her milk supply by round-the-clock pumping.   Mom is interested in an outpatient lactation appt; a request for that was sent.    Matthias Hughs [facility] 08/06/2019, 11:32 AM

## 2019-11-17 ENCOUNTER — Encounter: Payer: Self-pay | Admitting: Family Medicine

## 2020-01-11 ENCOUNTER — Ambulatory Visit (INDEPENDENT_AMBULATORY_CARE_PROVIDER_SITE_OTHER): Payer: 59 | Admitting: Psychology

## 2020-01-11 DIAGNOSIS — F4323 Adjustment disorder with mixed anxiety and depressed mood: Secondary | ICD-10-CM | POA: Diagnosis not present

## 2020-01-19 ENCOUNTER — Other Ambulatory Visit: Payer: Self-pay

## 2020-01-19 ENCOUNTER — Encounter: Payer: Self-pay | Admitting: *Deleted

## 2020-01-19 ENCOUNTER — Telehealth (INDEPENDENT_AMBULATORY_CARE_PROVIDER_SITE_OTHER): Payer: 59 | Admitting: Family Medicine

## 2020-01-19 ENCOUNTER — Ambulatory Visit: Payer: 59 | Admitting: Psychology

## 2020-01-19 DIAGNOSIS — E785 Hyperlipidemia, unspecified: Secondary | ICD-10-CM | POA: Insufficient documentation

## 2020-01-19 DIAGNOSIS — D649 Anemia, unspecified: Secondary | ICD-10-CM | POA: Diagnosis not present

## 2020-01-19 NOTE — Progress Notes (Signed)
Virtual Visit via Video Note  I connected with Sandy Love on 01/19/20 at  8:20 AM EDT by a video enabled telemedicine application and verified that I am speaking with the correct person using two identifiers.  Location: Patient: home Provider: office, provider and patient in visit   I discussed the limitations of evaluation and management by telemedicine and the availability of in person appointments. The patient expressed understanding and agreed to proceed. Sandy Love, CMA was able to get the patient set up on a video visit      Subjective:    Patient ID: Sandy Love, female    DOB: 07/02/1981, 39 y.o.   MRN: [id number]  No chief complaint on file.   HPI Patient is in today for follow up on chronic medical concerns. She is feeling well today. No recent febrile illness or hospitalizations. She has a 47 month old son who was born notably premature but is doing well. 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 Denies CP/palp/SOB/HA/congestion/fevers/GI or GU c/o. Taking meds as prescribed  Past Medical History:  Diagnosis Date  . History of miscarriage 09/20/2014   X 3    . Obesity 09/20/2014  . Pedal edema 02/27/2015  . Preventative health care 11/18/2014    Past Surgical History:  Procedure Laterality Date  . CERVICAL CERCLAGE N/A 04/25/2019   Procedure: CERCLAGE CERVICAL;  Surgeon: Noralee Space, MD;  Location: [facility] LD ORS;  Service: Gynecology;  Laterality: N/A;  request extra assist for holding retractors  . CERVICAL CERCLAGE N/A 06/13/2019   Procedure: CERCLAGE REMOVAL;  Surgeon: Noland Fordyce, MD;  Location: [facility] LD ORS;  Service: Gynecology;  Laterality: N/A;  . DILATION AND CURETTAGE OF UTERUS      Family History  Problem Relation Age of Onset  . Diabetes Maternal Grandmother   . Diabetes Paternal Grandmother   . Obesity Sister   . Renal Disease Brother        single kidney  . Dementia Paternal Grandfather   .  Obesity Brother   . Cancer Maternal Aunt 70       breast    Social History   Socioeconomic History  . Marital status: Single    Spouse name: Not on file  . Number of children: Not on file  . Years of education: Not on file  . Highest education level: Not on file  Occupational History  . Not on file  Tobacco Use  . Smoking status: Former Smoker    Types: Cigars, Cigarettes  . Smokeless tobacco: Never Used  Substance and Sexual Activity  . Alcohol use: Not Currently    Comment: occ  . Drug use: No  . Sexual activity: Yes    Birth control/protection: None  Other Topics Concern  . Not on file  Social History Narrative  . Not on file   Social Determinants of Health   Financial Resource Strain: Low Risk   . Difficulty of Paying Living Expenses: Not hard at all  Food Insecurity: No Food Insecurity  . Worried About Programme researcher, broadcasting/film/video in the Last Year: Never true  . Ran Out of Food in the Last Year: Never true  Transportation Needs: No Transportation Needs  . Lack of Transportation (Medical): No  . Lack of Transportation (Non-Medical): No  Physical Activity:   . Days of Exercise per Week:   . Minutes of Exercise per Session:   Stress:   . Feeling  of Stress :   Social Connections:   . Frequency of Communication with Friends and Family:   . Frequency of Social Gatherings with Friends and Family:   . Attends Religious Services:   . Active Member of Clubs or Organizations:   . Attends Archivist Meetings:   Marland Kitchen Marital Status:   Intimate Partner Violence:   . Fear of Current or Ex-Partner:   . Emotionally Abused:   Marland Kitchen Physically Abused:   . Sexually Abused:     Outpatient Medications Prior to Visit  Medication Sig Dispense Refill  . acetaminophen (TYLENOL) 325 MG tablet Take 2 tablets (650 mg total) by mouth every 4 (four) hours as needed (for pain scale < 4).    . ibuprofen (ADVIL) 600 MG tablet Take 1 tablet (600 mg total) by mouth every 6 (six) hours. 30  tablet 0  . Prenatal Vit-Fe Fumarate-FA (PRENATAL/FOLIC ACID) TABS Take 1 tablet by mouth daily. 90 tablet 1   No facility-administered medications prior to visit.    No Known Allergies  Review of Systems  Constitutional: Negative for fever and malaise/fatigue.  HENT: Negative for congestion.   Eyes: Negative for blurred vision.  Respiratory: Negative for shortness of breath.   Cardiovascular: Negative for chest pain, palpitations and leg swelling.  Gastrointestinal: Negative for abdominal pain, blood in stool and nausea.  Genitourinary: Negative for dysuria and frequency.  Musculoskeletal: Negative for falls.  Skin: Negative for rash.  Neurological: Negative for dizziness, loss of consciousness and headaches.  Endo/Heme/Allergies: Negative for environmental allergies.  Psychiatric/Behavioral: Negative for depression. The patient is not nervous/anxious.        Objective:    Physical Exam Constitutional:      Appearance: Normal appearance. She is not ill-appearing.  HENT:     Head: Normocephalic and atraumatic.     Right Ear: External ear normal.     Left Ear: External ear normal.     Nose: Nose normal.  Eyes:     General:        Right eye: No discharge.        Left eye: No discharge.  Pulmonary:     Effort: Pulmonary effort is normal.  Neurological:     Mental Status: She is alert and oriented to person, place, and time.  Psychiatric:        Behavior: Behavior normal.     There were no vitals taken for this visit. Wt Readings from Last 3 Encounters:  06/13/19 (!) 322 lb (146.1 kg)  04/26/19 (!) 325 lb (147.4 kg)  04/09/19 (!) 321 lb 9.6 oz (145.9 kg)    Diabetic Foot Exam - Simple   No data filed     Lab Results  Component Value Date   WBC 9.6 06/14/2019   HGB 10.6 (L) 06/14/2019   HCT 33.1 (L) 06/14/2019   PLT 179 06/14/2019   GLUCOSE 107 (H) 06/08/2019   CHOL 181 12/17/2017   TRIG 81.0 12/17/2017   HDL 47.70 12/17/2017   LDLCALC 117 (H) 12/17/2017    ALT 17 06/08/2019   AST 17 06/08/2019   NA 137 06/08/2019   K 4.1 06/08/2019   CL 105 06/08/2019   CREATININE 0.78 06/08/2019   BUN <5 (L) 06/08/2019   CO2 25 06/08/2019   TSH 4.40 12/17/2017   HGBA1C 6.2 01/16/2019    Lab Results  Component Value Date   TSH 4.40 12/17/2017   Lab Results  Component Value Date   WBC 9.6 06/14/2019  HGB 10.6 (L) 06/14/2019   HCT 33.1 (L) 06/14/2019   MCV 90.7 06/14/2019   PLT 179 06/14/2019   Lab Results  Component Value Date   NA 137 06/08/2019   K 4.1 06/08/2019   CO2 25 06/08/2019   GLUCOSE 107 (H) 06/08/2019   BUN <5 (L) 06/08/2019   CREATININE 0.78 06/08/2019   BILITOT 0.4 06/08/2019   ALKPHOS 71 06/08/2019   AST 17 06/08/2019   ALT 17 06/08/2019   PROT 5.9 (L) 06/08/2019   ALBUMIN 2.4 (L) 06/08/2019   CALCIUM 8.9 06/08/2019   ANIONGAP 7 06/08/2019   GFR 97.58 01/14/2019   Lab Results  Component Value Date   CHOL 181 12/17/2017   Lab Results  Component Value Date   HDL 47.70 12/17/2017   Lab Results  Component Value Date   LDLCALC 117 (H) 12/17/2017   Lab Results  Component Value Date   TRIG 81.0 12/17/2017   Lab Results  Component Value Date   CHOLHDL 4 12/17/2017   Lab Results  Component Value Date   HGBA1C 6.2 01/16/2019       Assessment & Plan:   Problem List Items Addressed This Visit    Hyperlipidemia    Encouraged heart healthy diet, increase exercise, avoid trans fats, consider a krill oil cap daily      Anemia    Increase leafy greens, consider increased lean red meat and using cast iron cookware. Continue to monitor, report any concerns      Morbid obesity (HCC)    Encouraged DASH or MIND diet, decrease po intake and increase exercise as tolerated. Needs 7-8 hours of sleep nightly. Avoid trans fats, eat small, frequent meals every 4-5 hours with lean proteins, complex carbs and healthy fats. Minimize simple carbs         I am having Dhamar M. Dorr maintain her Prenatal/Folic Acid,  ibuprofen, and acetaminophen.  No orders of the defined types were placed in this encounter.  I discussed the assessment and treatment plan with the patient. The patient was provided an opportunity to ask questions and all were answered. The patient agreed with the plan and demonstrated an understanding of the instructions.   The patient was advised to call back or seek an in-person evaluation if the symptoms worsen or if the condition fails to improve as anticipated.  I provided 20 minutes of non-face-to-face time during this encounter.   Danise Edge, MD

## 2020-01-19 NOTE — Assessment & Plan Note (Signed)
Encouraged DASH or MIND diet, decrease po intake and increase exercise as tolerated. Needs 7-8 hours of sleep nightly. Avoid trans fats, eat small, frequent meals every 4-5 hours with lean proteins, complex carbs and healthy fats. Minimize simple carbs 

## 2020-01-19 NOTE — Assessment & Plan Note (Signed)
Increase leafy greens, consider increased lean red meat and using cast iron cookware. Continue to monitor, report any concerns 

## 2020-01-19 NOTE — Assessment & Plan Note (Signed)
Encouraged heart healthy diet, increase exercise, avoid trans fats, consider a krill oil cap daily 

## 2020-01-21 ENCOUNTER — Encounter: Payer: 59 | Admitting: Family Medicine

## 2020-01-27 ENCOUNTER — Other Ambulatory Visit: Payer: Self-pay

## 2020-01-27 ENCOUNTER — Other Ambulatory Visit (INDEPENDENT_AMBULATORY_CARE_PROVIDER_SITE_OTHER): Payer: 59

## 2020-01-27 DIAGNOSIS — E785 Hyperlipidemia, unspecified: Secondary | ICD-10-CM

## 2020-01-27 DIAGNOSIS — D649 Anemia, unspecified: Secondary | ICD-10-CM | POA: Diagnosis not present

## 2020-01-27 LAB — CBC
HCT: 37.6 % (ref 36.0–46.0)
Hemoglobin: 12.5 g/dL (ref 12.0–15.0)
MCHC: 33.2 g/dL (ref 30.0–36.0)
MCV: 86.1 fl (ref 78.0–100.0)
Platelets: 251 10*3/uL (ref 150.0–400.0)
RBC: 4.37 Mil/uL (ref 3.87–5.11)
RDW: 15.2 % (ref 11.5–15.5)
WBC: 4.9 10*3/uL (ref 4.0–10.5)

## 2020-01-27 LAB — COMPREHENSIVE METABOLIC PANEL
ALT: 13 U/L (ref 0–35)
AST: 13 U/L (ref 0–37)
Albumin: 3.8 g/dL (ref 3.5–5.2)
Alkaline Phosphatase: 56 U/L (ref 39–117)
BUN: 10 mg/dL (ref 6–23)
CO2: 29 mEq/L (ref 19–32)
Calcium: 9.1 mg/dL (ref 8.4–10.5)
Chloride: 103 mEq/L (ref 96–112)
Creatinine, Ser: 0.9 mg/dL (ref 0.40–1.20)
GFR: 84.71 mL/min (ref >60.00)
Glucose, Bld: 100 mg/dL — ABNORMAL HIGH (ref 70–99)
Potassium: 4.8 mEq/L (ref 3.5–5.1)
Sodium: 137 mEq/L (ref 135–145)
Total Bilirubin: 0.3 mg/dL (ref 0.2–1.2)
Total Protein: 6.6 g/dL (ref 6.0–8.3)

## 2020-01-27 LAB — LIPID PANEL
Cholesterol: 199 mg/dL (ref 0–200)
HDL: 45.5 mg/dL (ref >39.00)
LDL Cholesterol: 140 mg/dL — ABNORMAL HIGH (ref 0–99)
NonHDL: 153.87
Total CHOL/HDL Ratio: 4
Triglycerides: 68 mg/dL (ref 0.0–149.0)
VLDL: 13.6 mg/dL (ref 0.0–40.0)

## 2020-01-27 LAB — TSH: TSH: 2.55 u[IU]/mL (ref 0.35–4.50)

## 2020-02-09 ENCOUNTER — Ambulatory Visit (INDEPENDENT_AMBULATORY_CARE_PROVIDER_SITE_OTHER): Payer: 59 | Admitting: Psychology

## 2020-02-09 DIAGNOSIS — F4323 Adjustment disorder with mixed anxiety and depressed mood: Secondary | ICD-10-CM | POA: Diagnosis not present

## 2020-02-13 IMAGING — US US MFM OB TRANSVAGINAL
1 series · 13 of 21 positions shown · non-contrast
Comparison: none

[Series 1: us mfm ob transvaginal · 21 acquisitions, 13 frames shown]
[im 1/21]
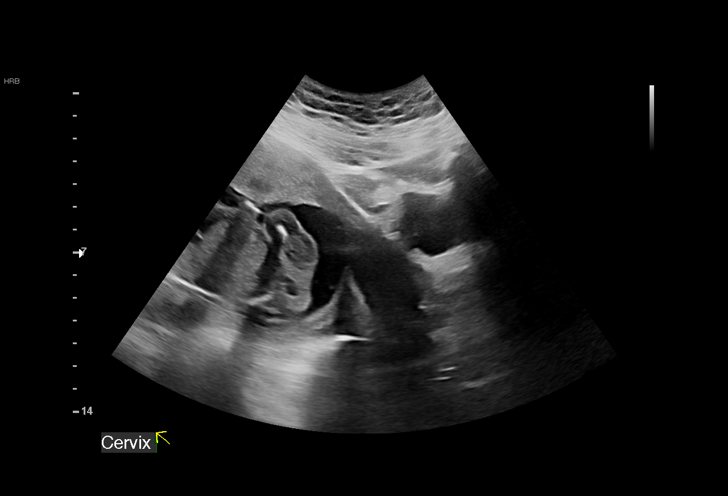
[im 3/21]
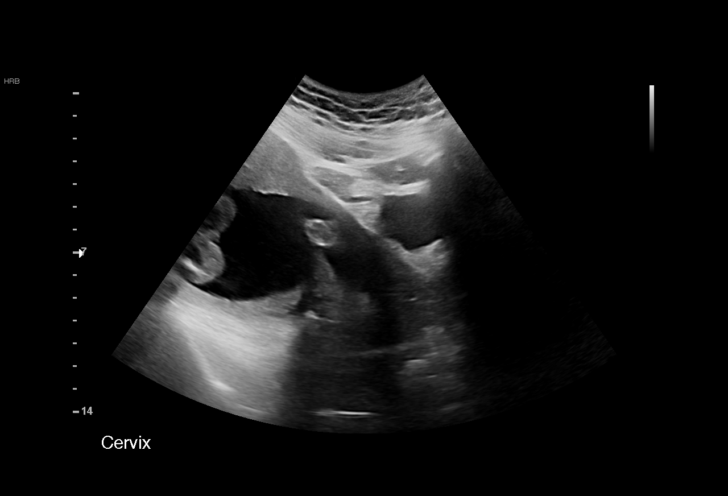
[im 5/21]
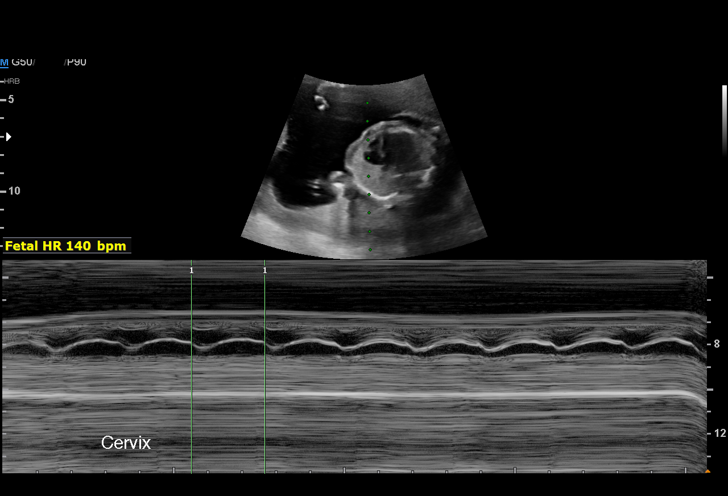
[im 6/21]
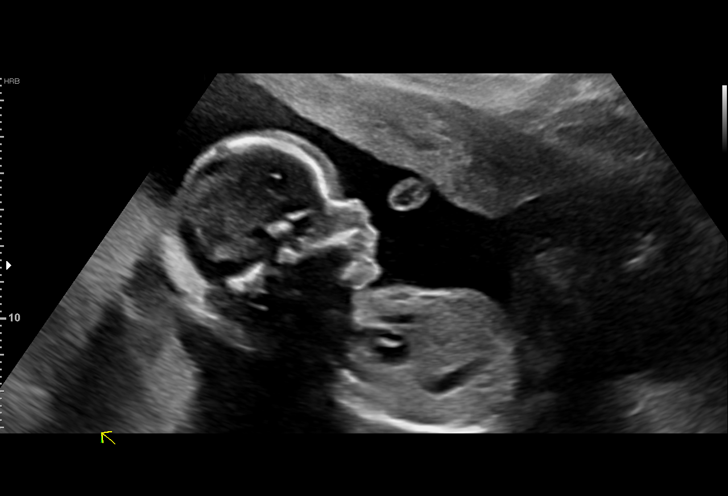
[im 8/21]
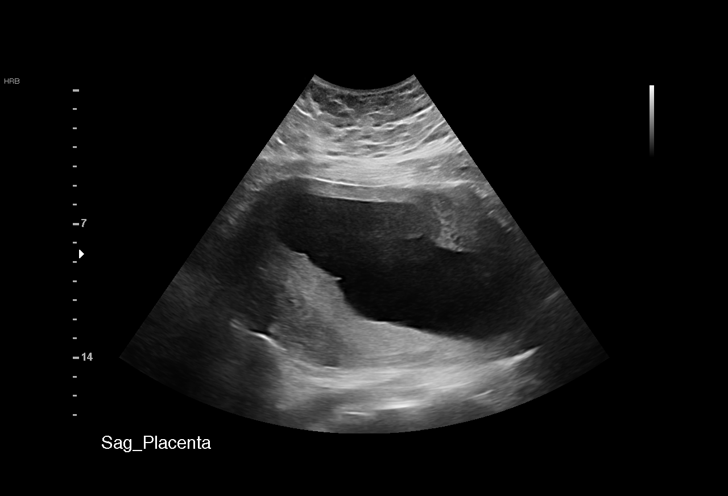
[im 9/21]
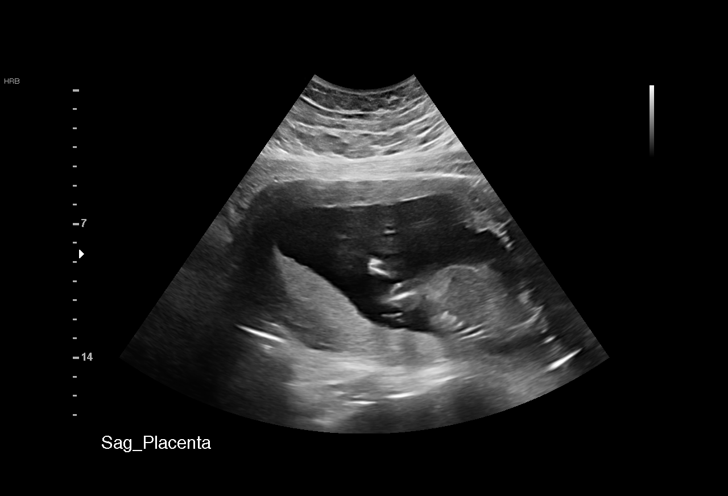
[im 11/21]
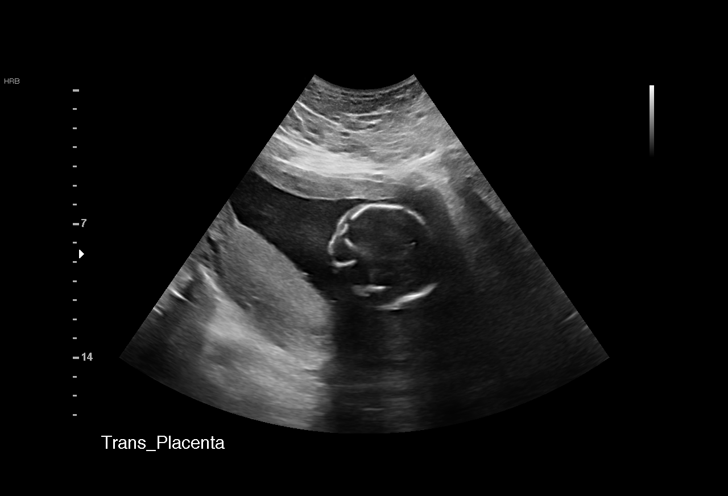
[im 13/21]
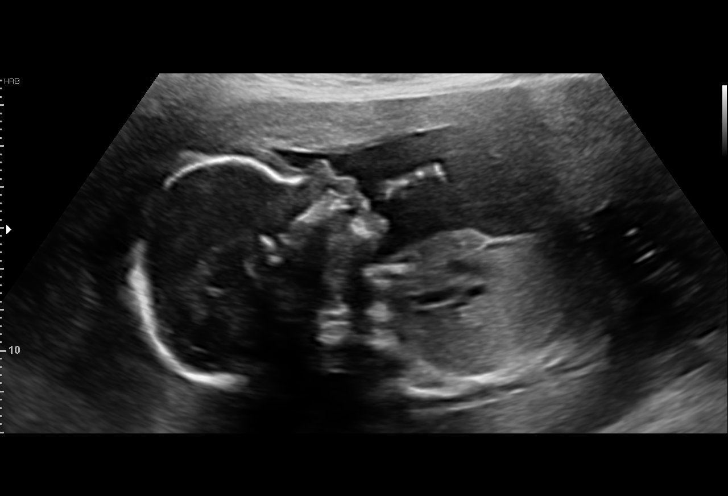
[im 14/21]
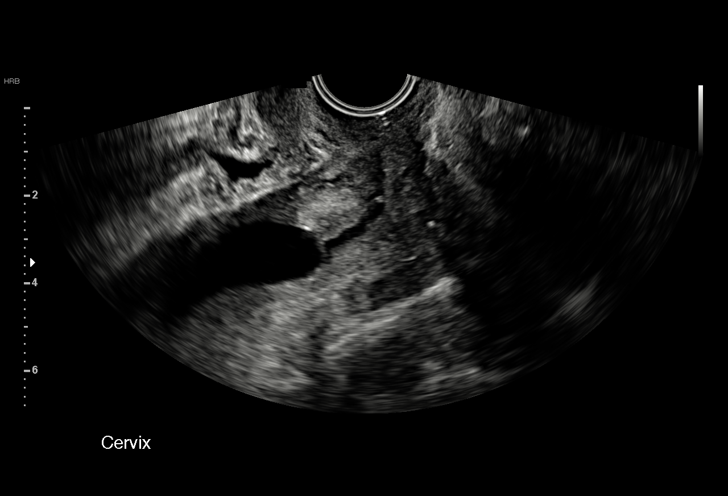
[im 16/21]
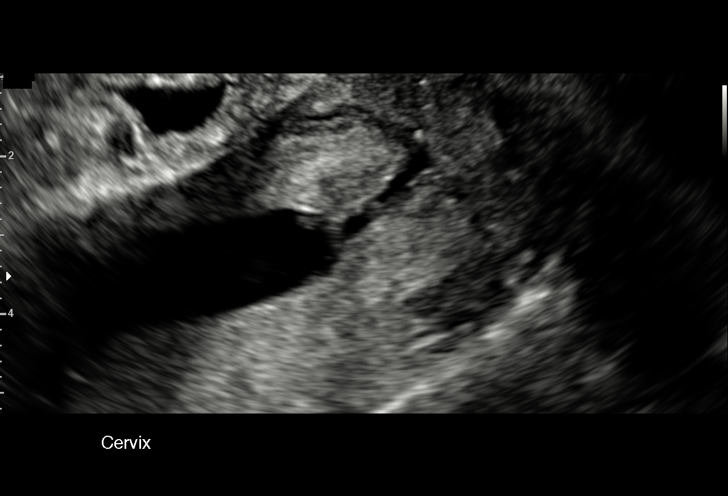
[im 17/21]
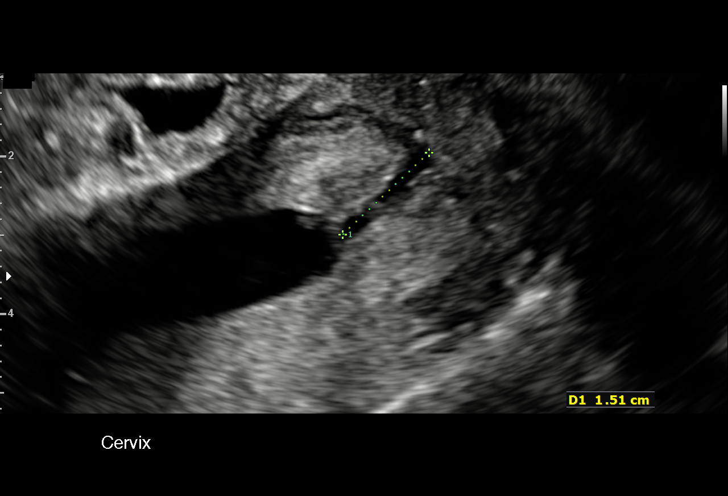
[im 19/21]
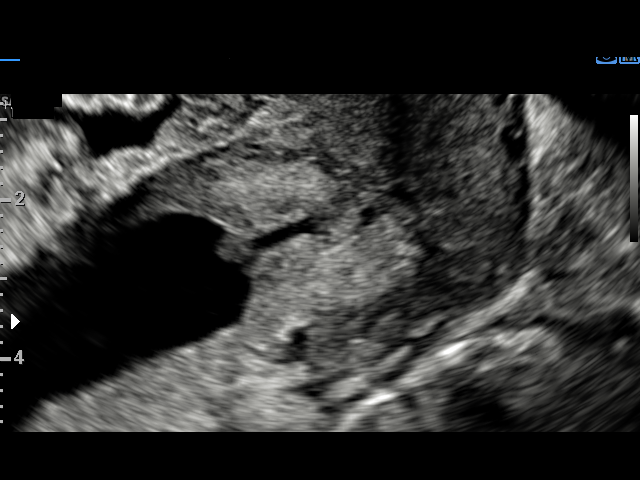
[im 21/21]
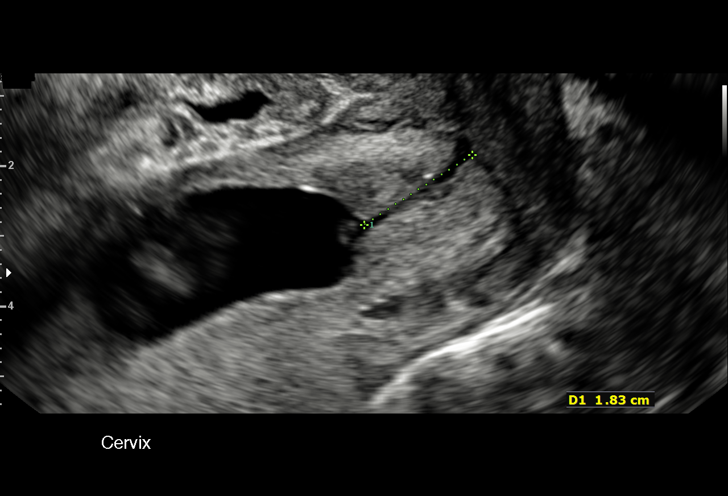

[13 of 21 positions shown; findings below may reference images not displayed]

& Infertility
                                                            [id number] [REDACTED]

 ----------------------------------------------------------------------

 ----------------------------------------------------------------------
Indications

  Encounter for cervical length
  Advanced maternal age multigravida 35+,
  second trimester
  Poor obstetric history-Recurrent (habitual)
  abortion (3 consecutive ab's)
  Abnormal biochemical screen (insufficiant
  fetal DNA, neg quad)
  Cervical incompetence, second trimester
  Obesity complicating pregnancy, second
  trimester
  20 weeks gestation of pregnancy
 ----------------------------------------------------------------------
Vital Signs

 BMI:
Fetal Evaluation

 Num Of Fetuses:         1
 Fetal Heart Rate(bpm):  140
 Cardiac Activity:       Observed
 Presentation:           Breech
 Placenta:               Posterior

 Amniotic Fluid
 AFI FV:      Within normal limits
OB History
 Gravidity:    5          SAB:   3
 TOP:          1
Gestational Age

 LMP:           20w 1d        Date:  11/19/18                 EDD:   08/26/19
 Best:          20w 1d     Det. By:  LMP  (11/19/18)          EDD:   08/26/19
Cervix Uterus Adnexa

 Cervix
 Length:            1.5  cm.
 Measured transvaginally.

 Uterus
 No abnormality visualized.

 Left Ovary
 Within normal limits.

 Right Ovary
 Within normal limits.

 Cul De Sac
 No free fluid seen.

 Adnexa
 No abnormality visualized.
Impression

 A limited ultrasound study was performed. Amniotic fluid is
 normal and good fetal activity is seen. We performed
 transvaginal ultrasound to evaluate the cervix. The shortest
 cervical length measurement is 1.5 cm. An echolucent area
 in the cervical canal gives an appearance of funneling, but
 the internal os is clearly seen.
 xxxxxxxxx[id number]
 Consultation Note (copied from [REDACTED])
 Maternal-Fetal Medicine

 Name: Mellie Abdulle
 MRN: [id number]
 Requesting Provider: Dr. Fried, Josmar

 K[facility] the [facility] She is G4 P0 at 20w 1d gestation and is
 here for consultation because of her diagnosis of cervical
 incompetence.

 On your office ultrasound performed last week, the cervical
 length measurement was 2 centimeters. Patient was advised
 to take vaginal progesterone last week. On today's ultrasound
 at your office, the cervical length measured 7 millimeters with
 funneling. She does not have symptoms of pelvic pressure or
 abdominal cramping or vaginal bleeding. She has been
 taking vaginal progesterone regularly.

 Her prenatal course has, otherwise, been uneventful. On cell-
 free fetal DNA screening, the risks of aneuploidies were not
 increased. MSAFP screening showed low risk for open-neural
 tube defects. She had fetal anatomy scan at your office that
 was reported as normal.

 Obstetric history is significant for 3 early spontaneous
 miscarriages.
 Gyn: No history of abnormal Pap smears or cervical
 surgeries. No history of breast disease.

 PMH: No history of hypertension or diabetes or any chronic
 medical conditions. She does not have sickle-cell trait.
 PSH: D&C.
 Allergies: NKDA.
 Medications: Prenatal vitamins, vaginal progesterone.
 Social: Denies tobacco or drug or alcohol use. Her partner is
 in good health.
 Family: No history of venous thromboembolism in the family.

 I counseled the patient on the following:
 Cervical shortening:
 I discussed the findings with help of ultrasound images and
 diagrams.
 Studies have shown that in women with no history of preterm
 birth and a current short cervical length, treatment vaginal
 progesterone (micronized progesterone 200 milligrams daily)
 leads to a significant reduction of spontaneous preterm birth
 before 34 weeks.
 Alternative treatment would be cervical cerclage.  However,
 cerclage in women without a history of preterm birth and
 short cervix has not shown a statistical reduction in preterm
 delivery rates. I discussed the procedure and its possible
 complications.
 I informed her that cerclage may be a better option if she is
 likely to be noncompliant with vaginal progesterone. Patient
 understands the importance of daily administration of vaginal
 progesterone and informed me that she is usually very
 compliant with medications.
 I recommended that she continue taking vaginal
 progesterone. We discussed symptoms of pelvic pressure or
 vaginal bleeding that should prompt her to call your office.
 If repeat cervical length measurement is less than 10
 millimeters in 2 weeks, cerclage may be a better option.
 Cerclage is likely to prolong the pregnancy with better
 neonatal outcomes.
 I informed her that cerclage or progesterone treatment does
 not guarantee carrying the pregnancy to term.
Recommendations

 -An appointment was made for her to return in 2 weeks for
 cervical length measurement.
 -Continue vaginal progesterone 200 milligrams daily till 36
 weeks.
                 Rxoo, Mibsham

## 2020-02-28 IMAGING — US US MFM OB TRANSVAGINAL
1 series · 14 of 14 positions shown · non-contrast
Comparison: none

[Series 1: us mfm ob transvaginal · 14 acquisitions, 14 frames shown]
[im 1/14]
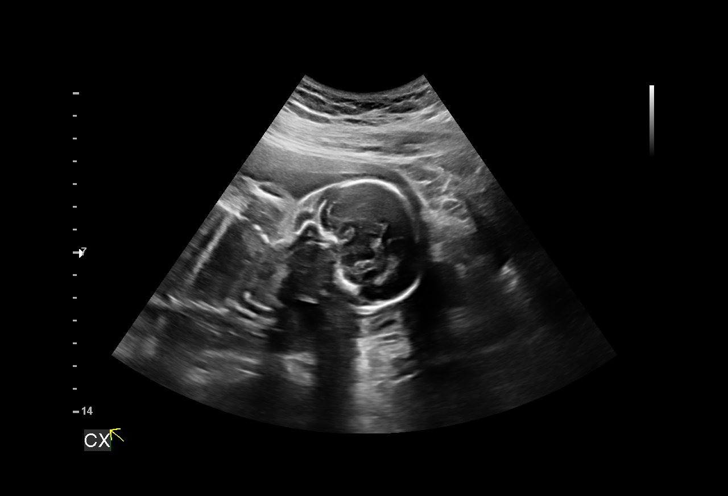
[im 2/14]
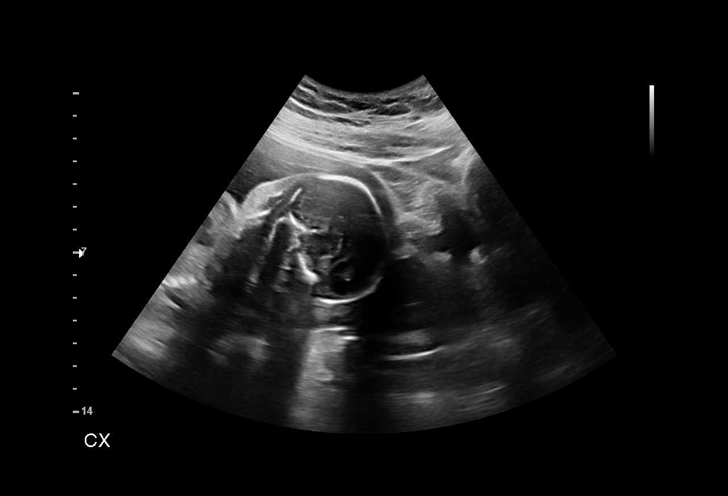
[im 3/14]
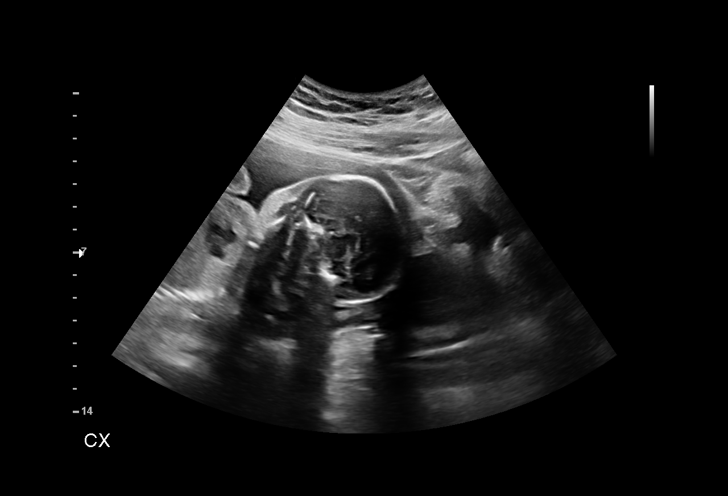
[im 4/14]
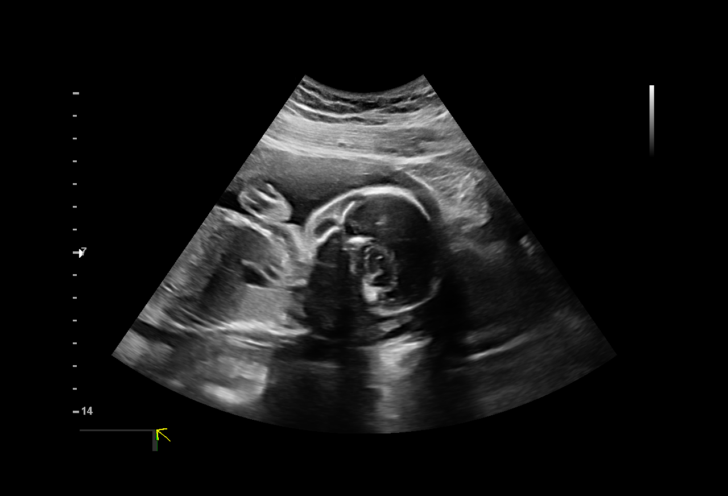
[im 5/14]
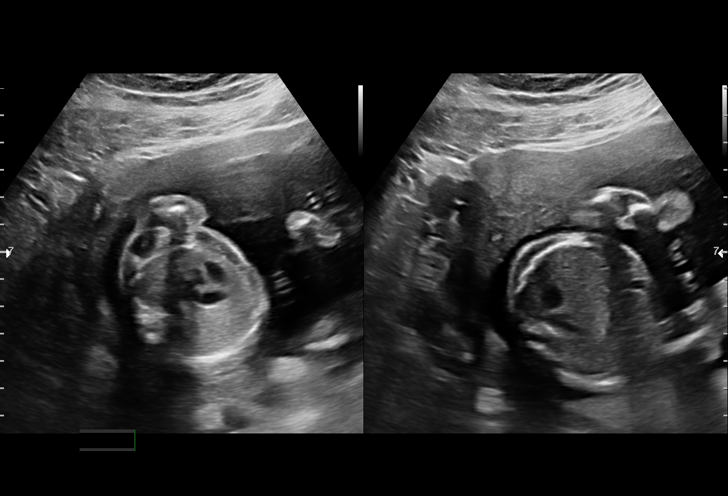
[im 6/14]
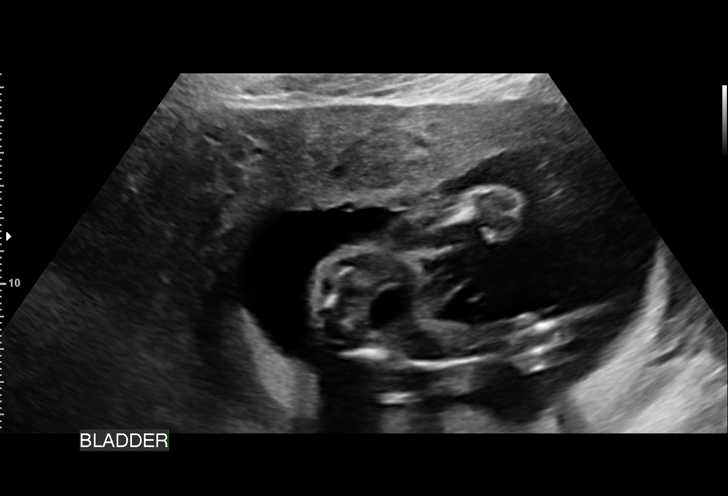
[im 7/14]
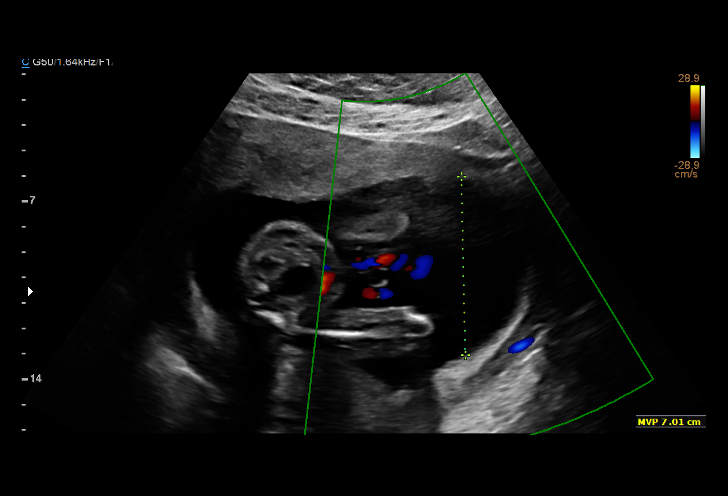
[im 8/14]
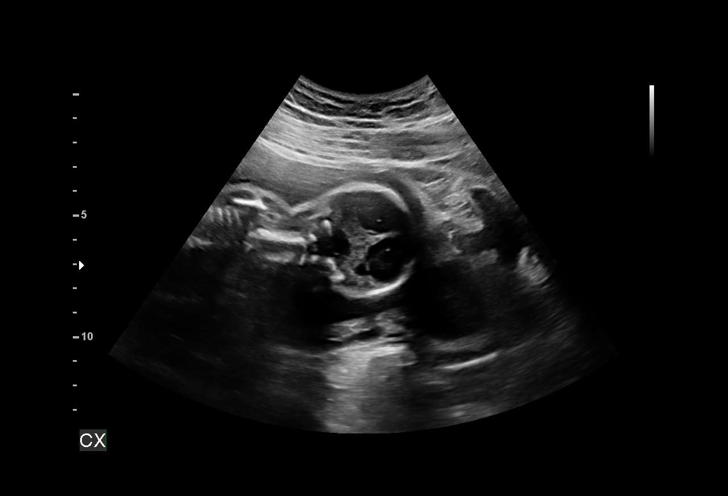
[im 9/14]
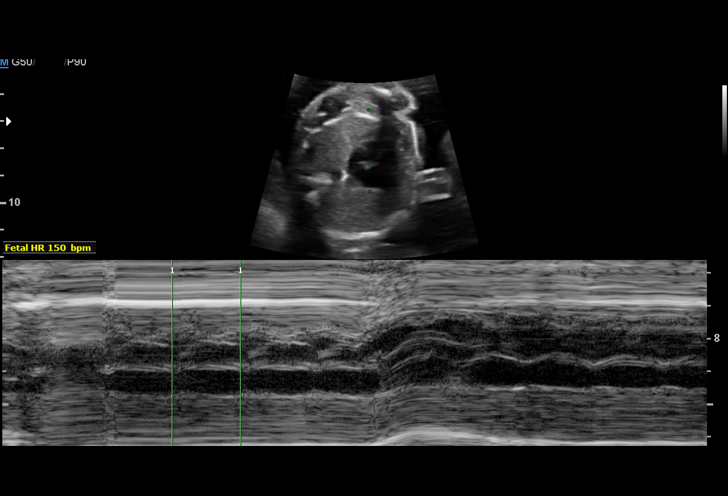
[im 10/14]
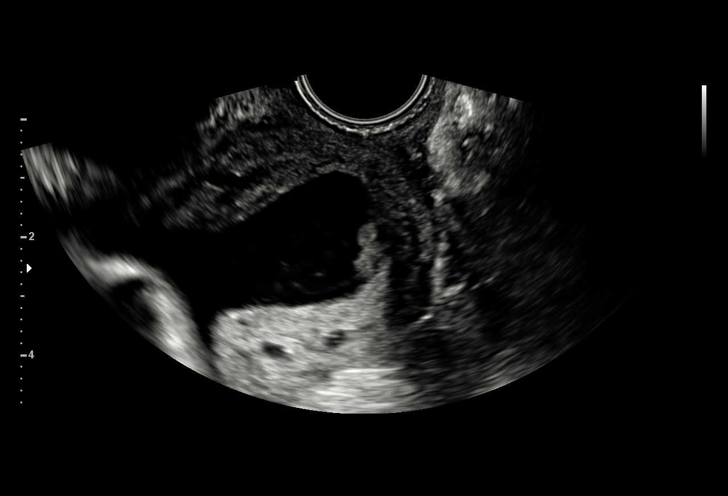
[im 11/14]
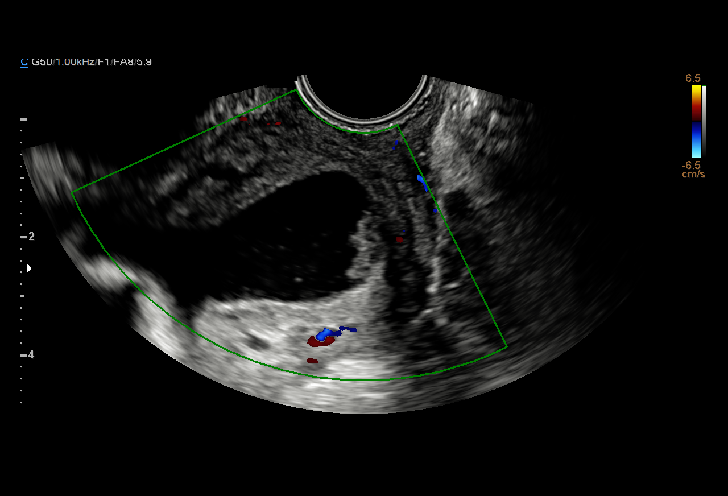
[im 12/14]
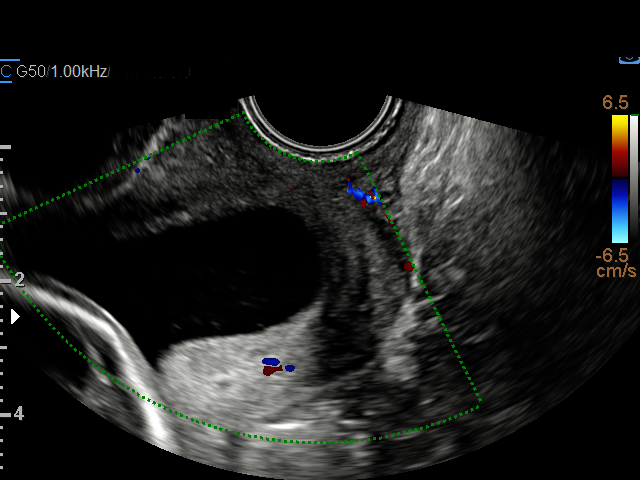
[im 13/14]
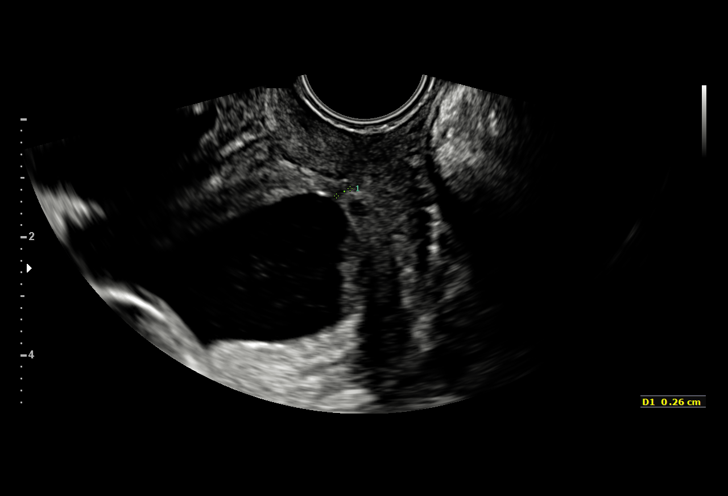
[im 14/14]
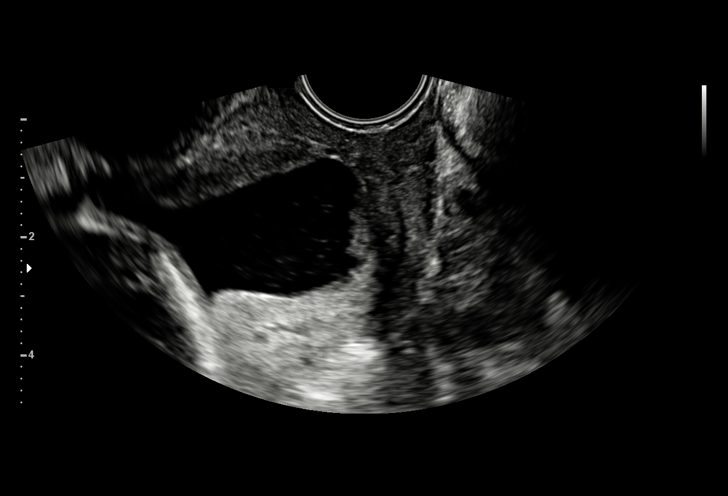

[14 of 14 positions shown; findings below may reference images not displayed]

& Infertility
                                                            [id number] [REDACTED]

 ----------------------------------------------------------------------

 ----------------------------------------------------------------------
Indications

  22 weeks gestation of pregnancy
  Encounter for cervical length
  Advanced maternal age multigravida 36+,
  second trimester
  Poor obstetric history-Recurrent (habitual)
  abortion (3 consecutive ab's)
  Abnormal biochemical screen (insufficiant
  fetal DNA, neg quad)
  Cervical incompetence, second trimester
  Obesity complicating pregnancy, second
  trimester
 ----------------------------------------------------------------------
Vital Signs

                                                Height:        5'10"
Fetal Evaluation

 Num Of Fetuses:         1
 Fetal Heart Rate(bpm):  150
 Cardiac Activity:       Observed
 Presentation:           Cephalic

 Amniotic Fluid
 AFI FV:      Within normal limits

                             Largest Pocket(cm)

OB History
 Gravidity:    5          SAB:   3
 TOP:          1
Gestational Age

 LMP:           22w 2d        Date:  11/19/18                 EDD:   08/26/19
 Best:          22w 2d     Det. By:  LMP  (11/19/18)          EDD:   08/26/19
Anatomy

 Thoracic:              Appears normal         Bladder:                Appears normal
 Stomach:               Appears normal, left
                        sided
Cervix Uterus Adnexa

 Cervix
 Length:            0.3  cm.
 Measured transvaginally.

 Uterus
 No abnormality visualized.

 Left Ovary
 No adnexal mass visualized.

 Right Ovary
 No adnexal mass visualized.

 Cul De Sac
 No free fluid seen.

 Adnexa
 No abnormality visualized.
Impression

 Ms. Dauti, Jurgec1 P0 at 22-weeks' gestation returned for cervical
 length measurement. On ultrasound performed 2 weeks ago,
 the shortest cervical length measurement was 1.5 cm. After
 counseling, the patient opted for vaginal progesterone
 treatment. She does not have symptoms of pelvic pressure or
 abdominal cramping or vaginal bleeding.

 On ultrasound, amniotic fluid is normal and good fetal activity
 is seen. On transvaginal scan, the cervical canal is dilated up
 to the external os and the residual cervical length barely
 measures 3 millimeters. After explaining, I performed sterile-
 speculum examination. I could not visualize the cervix clearly.
 On vaginal examination, the cervix is about 1 cm long and the
 external os is closed.

 I explained the findings with images and diagrams. Despite
 regularly taking vaginal progesterone, cervical shortening has
 progressed. I recommended rescue cerclage.  I discussed
 cerclage procedure and possible complications including
 bleeding, infection, miscarriage, and injuries to bladder or
 bowel (all rare).
 Patient opted for cerclage. She will be [facility].
 I discussed with Dr. Tiku.
Recommendations

 -Indocin 50 mg PO q 5hourly for 48 hours.
 -Prophylactic antibiotics.
                 Unzueta, Nik

## 2020-03-17 ENCOUNTER — Ambulatory Visit: Payer: 59 | Admitting: Psychology

## 2020-03-24 ENCOUNTER — Encounter: Payer: Self-pay | Admitting: Family Medicine

## 2020-03-24 ENCOUNTER — Other Ambulatory Visit: Payer: Self-pay

## 2020-03-24 ENCOUNTER — Telehealth (INDEPENDENT_AMBULATORY_CARE_PROVIDER_SITE_OTHER): Payer: 59 | Admitting: Family Medicine

## 2020-03-24 DIAGNOSIS — Z7189 Other specified counseling: Secondary | ICD-10-CM

## 2020-03-24 DIAGNOSIS — E785 Hyperlipidemia, unspecified: Secondary | ICD-10-CM

## 2020-03-24 DIAGNOSIS — D649 Anemia, unspecified: Secondary | ICD-10-CM | POA: Diagnosis not present

## 2020-03-24 NOTE — Assessment & Plan Note (Signed)
Patient is COVID vaccine hesitant due to the fact that she is still breast feeding her infant. Spent 20 minutes educating her on how important it is for her and the baby for her to get vaccinated. She will not pass the immunization to the baby through the breast milk and might actually pass some antibodies that will be protective. She and the baby will be safer after the shot. 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 She works from home and masks when she has to go out so her risk is minimized but with the infectivity of the Delta variant her  Risk is climbing. She is encouraged to reach out to her pediatritician and obstetrician to get their opinions on the immunization as well

## 2020-03-24 NOTE — Progress Notes (Signed)
Virtual Visit via Video Note  I connected with Sandy Love on 03/24/20 at  9:20 AM EDT by a video enabled telemedicine application and verified that I am speaking with the correct person using two identifiers.  Location: Patient: home, patient and provider are in visit Provider: home   I discussed the limitations of evaluation and management by telemedicine and the availability of in person appointments. The patient expressed understanding and agreed to proceed. Thelma Barge, CMA was able to get the patient set up in the visit   Subjective:    Patient ID: Sandy Love, female    DOB: Sep 14, 1980, 39 y.o.   MRN: [id number]  Chief Complaint  Patient presents with  . Follow-up    HPI Patient is in today for follow up on chronic medical concerns. No recent febrile illness or hospitalizations. She is a [employer] and is worried about receiving her covid vaccine while she is breast feeding her baby. She is doing well and works from home and masks when ever she is out in the world. Denies CP/palp/SOB/HA/congestion/fevers/GI or GU c/o. Taking meds as prescribed  Past Medical History:  Diagnosis Date  . History of miscarriage 09/20/2014   X 3    . Obesity 09/20/2014  . Pedal edema 02/27/2015  . Preventative health care 11/18/2014    Past Surgical History:  Procedure Laterality Date  . CERVICAL CERCLAGE N/A 04/25/2019   Procedure: CERCLAGE CERVICAL;  Surgeon: Noralee Space, MD;  Location: [facility] LD [facility]S;  Service: Gynecology;  Laterality: N/A;  request extra assist for holding retractors  . CERVICAL CERCLAGE N/A 06/13/2019   Procedure: CERCLAGE REMOVAL;  Surgeon: Noland Fordyce, MD;  Location: [facility] LD ORS;  Service: Gynecology;  Laterality: N/A;  . DILATION AND CURETTAGE OF UTERUS      Family History  Problem Relation Age of Onset  . Diabetes Maternal Grandmother   . Diabetes Paternal Grandmother   . Obesity Sister   . Renal Disease Brother        single kidney  . Dementia  Paternal Grandfather   . Obesity Brother   . Cancer Maternal Aunt 43       breast    Social History   Socioeconomic History  . Marital status: Single    Spouse name: Not on file  . Number of children: Not on file  . Years of education: Not on file  . Highest education level: Not on file  Occupational History  . Not on file  Tobacco Use  . Smoking status: Former Smoker    Types: Cigars, Cigarettes  . Smokeless tobacco: Never Used  Vaping Use  . Vaping Use: Never used  Substance and Sexual Activity  . Alcohol use: Not Currently    Comment: occ  . Drug use: No  . Sexual activity: Yes    Birth control/protection: None  Other Topics Concern  . Not on file  Social History Narrative  . Not on file   Social Determinants of Health   Financial Resource Strain: Low Risk   . Difficulty of Paying Living Expenses: Not hard at all  Food Insecurity: No Food Insecurity  . Worried About Programme researcher, broadcasting/film/video in the Last Year: Never true  . Ran Out of Food in the Last Year: Never true  Transportation Needs: No Transportation Needs  . Lack of Transportation (Medical): No  . Lack of Transportation (Non-Medical): No  Physical Activity:   . Days of Exercise per Week:   . Minutes  of Exercise per Session:   Stress:   . Feeling of Stress :   Social Connections:   . Frequency of Communication with Friends and Family:   . Frequency of Social Gatherings with Friends and Family:   . Attends Religious Services:   . Active Member of Clubs or Organizations:   . Attends Banker Meetings:   Marland Kitchen Marital Status:   Intimate Partner Violence:   . Fear of Current or Ex-Partner:   . Emotionally Abused:   Marland Kitchen Physically Abused:   . Sexually Abused:     Outpatient Medications Prior to Visit  Medication Sig Dispense Refill  . acetaminophen (TYLENOL) 325 MG tablet Take 2 tablets (650 mg total) by mouth every 4 (four) hours as needed (for pain scale < 4).    . ibuprofen (ADVIL) 600 MG  tablet Take 1 tablet (600 mg total) by mouth every 6 (six) hours. 30 tablet 0  . Prenatal Vit-Fe Fumarate-FA (PRENATAL/FOLIC ACID) TABS Take 1 tablet by mouth daily. 90 tablet 1   No facility-administered medications prior to visit.    No Known Allergies  Review of Systems  Constitutional: Negative for fever and malaise/fatigue.  HENT: Negative for congestion.   Eyes: Negative for blurred vision.  Respiratory: Negative for shortness of breath.   Cardiovascular: Negative for chest pain, palpitations and leg swelling.  Gastrointestinal: Negative for abdominal pain, blood in stool and nausea.  Genitourinary: Negative for dysuria and frequency.  Musculoskeletal: Negative for falls.  Skin: Negative for rash.  Neurological: Negative for dizziness, loss of consciousness and headaches.  Endo/Heme/Allergies: Negative for environmental allergies.  Psychiatric/Behavioral: Negative for depression. The patient is not nervous/anxious.        Objective:    Physical Exam Constitutional:      Appearance: Normal appearance. She is not ill-appearing.  HENT:     Head: Normocephalic and atraumatic.     Right Ear: External ear normal.     Left Ear: External ear normal.  Eyes:     General:        Right eye: No discharge.        Left eye: No discharge.  Pulmonary:     Effort: Pulmonary effort is normal.  Neurological:     General: No focal deficit present.     Mental Status: She is alert and oriented to person, place, and time.  Psychiatric:        Behavior: Behavior normal.     LMP 02/25/2020  Wt Readings from Last 3 Encounters:  06/13/19 (!) 322 lb (146.1 kg)  04/26/19 (!) 325 lb (147.4 kg)  04/09/19 (!) 321 lb 9.6 oz (145.9 kg)    Diabetic Foot Exam - Simple   No data filed     Lab Results  Component Value Date   WBC 4.9 01/27/2020   HGB 12.5 01/27/2020   HCT 37.6 01/27/2020   PLT 251.0 01/27/2020   GLUCOSE 100 (H) 01/27/2020   CHOL 199 01/27/2020   TRIG 68.0 01/27/2020     HDL 45.50 01/27/2020   LDLCALC 140 (H) 01/27/2020   ALT 13 01/27/2020   AST 13 01/27/2020   NA 137 01/27/2020   K 4.8 01/27/2020   CL 103 01/27/2020   CREATININE 0.90 01/27/2020   BUN 10 01/27/2020   CO2 29 01/27/2020   TSH 2.55 01/27/2020   HGBA1C 6.2 01/16/2019    Lab Results  Component Value Date   TSH 2.55 01/27/2020   Lab Results  Component Value Date  WBC 4.9 01/27/2020   HGB 12.5 01/27/2020   HCT 37.6 01/27/2020   MCV 86.1 01/27/2020   PLT 251.0 01/27/2020   Lab Results  Component Value Date   NA 137 01/27/2020   K 4.8 01/27/2020   CO2 29 01/27/2020   GLUCOSE 100 (H) 01/27/2020   BUN 10 01/27/2020   CREATININE 0.90 01/27/2020   BILITOT 0.3 01/27/2020   ALKPHOS 56 01/27/2020   AST 13 01/27/2020   ALT 13 01/27/2020   PROT 6.6 01/27/2020   ALBUMIN 3.8 01/27/2020   CALCIUM 9.1 01/27/2020   ANIONGAP 7 06/08/2019   GFR 84.71 01/27/2020   Lab Results  Component Value Date   CHOL 199 01/27/2020   Lab Results  Component Value Date   HDL 45.50 01/27/2020   Lab Results  Component Value Date   LDLCALC 140 (H) 01/27/2020   Lab Results  Component Value Date   TRIG 68.0 01/27/2020   Lab Results  Component Value Date   CHOLHDL 4 01/27/2020   Lab Results  Component Value Date   HGBA1C 6.2 01/16/2019       Assessment & Plan:   Problem List Items Addressed This Visit    Hyperlipidemia    Labs reviewed, no nre concerns.       Anemia    Improved on recheck, no changes needed      Morbid obesity (HCC)   Educated about COVID-19 virus infection    Patient is COVID vaccine hesitant due to the fact that she is still breast feeding her infant. Spent 20 minutes educating her on how important it is for her and the baby for her to get vaccinated. She will not pass the immunization to the baby through the breast milk and might actually pass some antibodies that will be protective. She and the baby will be safer after the shot. 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 She works from home and masks when she has to go out so her risk is minimized but with the infectivity of the Delta variant her  Risk is climbing. She is encouraged to reach out to her pediatritician and obstetrician to get their opinions on the immunization as well         I have discontinued Alejah M. Suk's Prenatal/Folic Acid, ibuprofen, and acetaminophen.  No orders of the defined types were placed in this encounter.    I discussed the assessment and treatment plan with the patient. The patient was provided an opportunity to ask questions and all were answered. The patient agreed with the plan and demonstrated an understanding of the instructions.   The patient was advised to call back or seek an in-person evaluation if the symptoms worsen or if the condition fails to improve as anticipated.  I provided 20 minutes of non-face-to-face time during this encounter.   Danise Edge, MD

## 2020-03-24 NOTE — Assessment & Plan Note (Deleted)
Encouraged DASH or MIND diet or NOOM or WW APP, decrease po intake and increase exercise as tolerated. Needs 7-8 hours of sleep nightly. Avoid trans fats, eat small, frequent meals every 4-5 hours with lean proteins, complex carbs and healthy fats. Minimize simple carbs

## 2020-03-24 NOTE — Assessment & Plan Note (Addendum)
Improved on recheck, no changes needed

## 2020-03-24 NOTE — Assessment & Plan Note (Addendum)
Labs reviewed, no nre concerns.

## 2020-03-28 ENCOUNTER — Encounter: Payer: Self-pay | Admitting: Family Medicine

## 2020-04-06 ENCOUNTER — Ambulatory Visit (INDEPENDENT_AMBULATORY_CARE_PROVIDER_SITE_OTHER): Payer: 59 | Admitting: Psychology

## 2020-04-06 ENCOUNTER — Encounter: Payer: Self-pay | Admitting: Family Medicine

## 2020-04-06 DIAGNOSIS — F4323 Adjustment disorder with mixed anxiety and depressed mood: Secondary | ICD-10-CM | POA: Diagnosis not present

## 2020-04-06 NOTE — Telephone Encounter (Signed)
LM requesting call back to determine: How long she plans to BF Plan to get vaccine after BFing Recommendations from OB/Peds.

## 2020-04-06 NOTE — Telephone Encounter (Signed)
Spoke with patient, advised she needs to complete Part I of form and send to email provided.  Pt voiced understanding.

## 2020-04-10 ENCOUNTER — Encounter: Payer: Self-pay | Admitting: Family Medicine

## 2020-04-21 ENCOUNTER — Ambulatory Visit (INDEPENDENT_AMBULATORY_CARE_PROVIDER_SITE_OTHER): Payer: 59 | Admitting: Psychology

## 2020-04-21 DIAGNOSIS — F4323 Adjustment disorder with mixed anxiety and depressed mood: Secondary | ICD-10-CM

## 2020-05-05 ENCOUNTER — Ambulatory Visit (INDEPENDENT_AMBULATORY_CARE_PROVIDER_SITE_OTHER): Payer: 59 | Admitting: Psychology

## 2020-05-05 DIAGNOSIS — F4323 Adjustment disorder with mixed anxiety and depressed mood: Secondary | ICD-10-CM | POA: Diagnosis not present

## 2020-05-31 ENCOUNTER — Other Ambulatory Visit: Payer: Self-pay

## 2020-05-31 ENCOUNTER — Ambulatory Visit (INDEPENDENT_AMBULATORY_CARE_PROVIDER_SITE_OTHER): Payer: 59

## 2020-05-31 DIAGNOSIS — Z23 Encounter for immunization: Secondary | ICD-10-CM | POA: Diagnosis not present

## 2020-06-23 ENCOUNTER — Ambulatory Visit (INDEPENDENT_AMBULATORY_CARE_PROVIDER_SITE_OTHER): Payer: 59 | Admitting: Psychology

## 2020-06-23 DIAGNOSIS — F4323 Adjustment disorder with mixed anxiety and depressed mood: Secondary | ICD-10-CM

## 2020-07-07 ENCOUNTER — Ambulatory Visit (INDEPENDENT_AMBULATORY_CARE_PROVIDER_SITE_OTHER): Payer: 59 | Admitting: Psychology

## 2020-07-07 DIAGNOSIS — F4323 Adjustment disorder with mixed anxiety and depressed mood: Secondary | ICD-10-CM

## 2020-07-21 ENCOUNTER — Ambulatory Visit (INDEPENDENT_AMBULATORY_CARE_PROVIDER_SITE_OTHER): Payer: 59 | Admitting: Psychology

## 2020-07-21 DIAGNOSIS — F4323 Adjustment disorder with mixed anxiety and depressed mood: Secondary | ICD-10-CM | POA: Diagnosis not present

## 2020-08-04 ENCOUNTER — Encounter: Payer: Self-pay | Admitting: Family Medicine

## 2020-08-04 ENCOUNTER — Other Ambulatory Visit: Payer: Self-pay

## 2020-08-04 ENCOUNTER — Ambulatory Visit (INDEPENDENT_AMBULATORY_CARE_PROVIDER_SITE_OTHER): Payer: 59 | Admitting: Family Medicine

## 2020-08-04 ENCOUNTER — Ambulatory Visit (INDEPENDENT_AMBULATORY_CARE_PROVIDER_SITE_OTHER): Payer: 59 | Admitting: Psychology

## 2020-08-04 VITALS — BP 130/69 | HR 85 | Temp 98.1°F | Resp 18 | Ht 67.0 in | Wt 313.4 lb

## 2020-08-04 DIAGNOSIS — F4323 Adjustment disorder with mixed anxiety and depressed mood: Secondary | ICD-10-CM | POA: Diagnosis not present

## 2020-08-04 DIAGNOSIS — R739 Hyperglycemia, unspecified: Secondary | ICD-10-CM | POA: Diagnosis not present

## 2020-08-04 DIAGNOSIS — E785 Hyperlipidemia, unspecified: Secondary | ICD-10-CM | POA: Diagnosis not present

## 2020-08-04 DIAGNOSIS — H52221 Regular astigmatism, right eye: Secondary | ICD-10-CM | POA: Diagnosis not present

## 2020-08-04 DIAGNOSIS — Z7189 Other specified counseling: Secondary | ICD-10-CM | POA: Diagnosis not present

## 2020-08-04 DIAGNOSIS — H5211 Myopia, right eye: Secondary | ICD-10-CM | POA: Diagnosis not present

## 2020-08-04 DIAGNOSIS — H52222 Regular astigmatism, left eye: Secondary | ICD-10-CM | POA: Diagnosis not present

## 2020-08-04 DIAGNOSIS — Z0000 Encounter for general adult medical examination without abnormal findings: Secondary | ICD-10-CM | POA: Diagnosis not present

## 2020-08-04 LAB — CBC
HCT: 38.1 % (ref 36.0–46.0)
Hemoglobin: 12.6 g/dL (ref 12.0–15.0)
MCHC: 33.1 g/dL (ref 30.0–36.0)
MCV: 85.9 fl (ref 78.0–100.0)
Platelets: 242 10*3/uL (ref 150.0–400.0)
RBC: 4.44 Mil/uL (ref 3.87–5.11)
RDW: 14.9 % (ref 11.5–15.5)
WBC: 5 10*3/uL (ref 4.0–10.5)

## 2020-08-04 LAB — LIPID PANEL
Cholesterol: 211 mg/dL — ABNORMAL HIGH (ref 0–200)
HDL: 43.1 mg/dL (ref >39.00)
LDL Cholesterol: 155 mg/dL — ABNORMAL HIGH (ref 0–99)
NonHDL: 167.46
Total CHOL/HDL Ratio: 5
Triglycerides: 64 mg/dL (ref 0.0–149.0)
VLDL: 12.8 mg/dL (ref 0.0–40.0)

## 2020-08-04 LAB — COMPREHENSIVE METABOLIC PANEL
ALT: 12 U/L (ref 0–35)
AST: 12 U/L (ref 0–37)
Albumin: 3.8 g/dL (ref 3.5–5.2)
Alkaline Phosphatase: 52 U/L (ref 39–117)
BUN: 13 mg/dL (ref 6–23)
CO2: 28 mEq/L (ref 19–32)
Calcium: 9 mg/dL (ref 8.4–10.5)
Chloride: 102 mEq/L (ref 96–112)
Creatinine, Ser: 0.96 mg/dL (ref 0.40–1.20)
GFR: 74.95 mL/min (ref >60.00)
Glucose, Bld: 84 mg/dL (ref 70–99)
Potassium: 4.7 mEq/L (ref 3.5–5.1)
Sodium: 135 mEq/L (ref 135–145)
Total Bilirubin: 0.3 mg/dL (ref 0.2–1.2)
Total Protein: 7.1 g/dL (ref 6.0–8.3)

## 2020-08-04 LAB — HEMOGLOBIN A1C: Hgb A1c MFr Bld: 6.1 % (ref 4.6–6.5)

## 2020-08-04 LAB — TSH: TSH: 1.73 u[IU]/mL (ref 0.35–4.50)

## 2020-08-04 NOTE — Assessment & Plan Note (Signed)
Encouraged DASH or MIND diet, decrease po intake and increase exercise as tolerated. Needs 7-8 hours of sleep nightly. Avoid trans fats, eat small, frequent meals every 4-5 hours with lean proteins, complex carbs and healthy fats. Minimize simple carbs 

## 2020-08-04 NOTE — Assessment & Plan Note (Signed)
Encouraged heart healthy diet, increase exercise, avoid trans fats, consider a krill oil cap daily 

## 2020-08-04 NOTE — Assessment & Plan Note (Signed)
Patient encouraged to maintain heart healthy diet, regular exercise, adequate sleep. Consider daily probiotics. Take medications as prescribed. Labs ordered and monitored

## 2020-08-04 NOTE — Patient Instructions (Addendum)
MIND diet   Grapeseed or Avocado oil for cooking Preventive Care 62-38 Years Old, Female Preventive care refers to visits with your health care provider and lifestyle choices that can promote health and wellness. This includes:  A yearly physical exam. This may also be called an annual well check.  Regular dental visits and eye exams.  Immunizations.  Screening for certain conditions.  Healthy lifestyle choices, such as eating a healthy diet, getting regular exercise, not using drugs or products that contain nicotine and tobacco, and limiting alcohol use. What can I expect for my preventive care visit? Physical exam Your health care provider will check your:  Height and weight. This may be used to calculate body mass index (BMI), which tells if you are at a healthy weight.  Heart rate and blood pressure.  Skin for abnormal spots. Counseling Your health care provider may ask you questions about your:  Alcohol, tobacco, and drug use.  Emotional well-being.  Home and relationship well-being.  Sexual activity.  Eating habits.  Work and work Statistician.  Method of birth control.  Menstrual cycle.  Pregnancy history. What immunizations do I need?  Influenza (flu) vaccine  This is recommended every year. Tetanus, diphtheria, and pertussis (Tdap) vaccine  You may need a Td booster every 10 years. Varicella (chickenpox) vaccine  You may need this if you have not been vaccinated. Human papillomavirus (HPV) vaccine  If recommended by your health care provider, you may need three doses over 6 months. Measles, mumps, and rubella (MMR) vaccine  You may need at least one dose of MMR. You may also need a second dose. Meningococcal conjugate (MenACWY) vaccine  One dose is recommended if you are age 10-21 years and a first-year college student living in a residence hall, or if you have one of several medical conditions. You may also need additional booster  doses. Pneumococcal conjugate (PCV13) vaccine  You may need this if you have certain conditions and were not previously vaccinated. Pneumococcal polysaccharide (PPSV23) vaccine  You may need one or two doses if you smoke cigarettes or if you have certain conditions. Hepatitis A vaccine  You may need this if you have certain conditions or if you travel or work in places where you may be exposed to hepatitis A. Hepatitis B vaccine  You may need this if you have certain conditions or if you travel or work in places where you may be exposed to hepatitis B. Haemophilus influenzae type b (Hib) vaccine  You may need this if you have certain conditions. You may receive vaccines as individual doses or as more than one vaccine together in one shot (combination vaccines). Talk with your health care provider about the risks and benefits of combination vaccines. What tests do I need?  Blood tests  Lipid and cholesterol levels. These may be checked every 5 years starting at age 79.  Hepatitis C test.  Hepatitis B test. Screening  Diabetes screening. This is done by checking your blood sugar (glucose) after you have not eaten for a while (fasting).  Sexually transmitted disease (STD) testing.  BRCA-related cancer screening. This may be done if you have a family history of breast, ovarian, tubal, or peritoneal cancers.  Pelvic exam and Pap test. This may be done every 3 years starting at age 87. Starting at age 64, this may be done every 5 years if you have a Pap test in combination with an HPV test. Talk with your health care provider about your test results,  treatment options, and if necessary, the need for more tests. Follow these instructions at home: Eating and drinking   Eat a diet that includes fresh fruits and vegetables, whole grains, lean protein, and low-fat dairy.  Take vitamin and mineral supplements as recommended by your health care provider.  Do not drink alcohol  if: ? Your health care provider tells you not to drink. ? You are pregnant, may be pregnant, or are planning to become pregnant.  If you drink alcohol: ? Limit how much you have to 0-1 drink a day. ? Be aware of how much alcohol is in your drink. In the [place] one drink equals one 12 oz bottle of beer (355 mL), one 5 oz glass of wine (148 mL), or one 1 oz glass of hard liquor (44 mL). Lifestyle  Take daily care of your teeth and gums.  Stay active. Exercise for at least 30 minutes on 5 or more days each week.  Do not use any products that contain nicotine or tobacco, such as cigarettes, e-cigarettes, and chewing tobacco. If you need help quitting, ask your health care provider.  If you are sexually active, practice safe sex. Use a condom or other form of birth control (contraception) in order to prevent pregnancy and STIs (sexually transmitted infections). If you plan to become pregnant, see your health care provider for a preconception visit. What's next?  Visit your health care provider once a year for a well check visit.  Ask your health care provider how often you should have your eyes and teeth checked.  Stay up to date on all vaccines. This information is not intended to replace advice given to you by your health care provider. Make sure you discuss any questions you have with your health care provider. Document Revised: 04/17/2018 Document Reviewed: 04/17/2018 Elsevier Patient Education  2020 [employer].

## 2020-08-05 NOTE — Progress Notes (Signed)
Subjective:    Patient ID: Sandy Love, female    DOB: [birth year], 39 y.o.   MRN: [id number]  Chief Complaint  Patient presents with  . Annual Exam    Doing well , no complaints    HPI Patient is in today for annual preventative exam and follow up on chronic medical concerns. No recent febrile illness or hospitalizations. She is doing well working largely from home. Her son turned a year in October and is doing well. No recent acute concerns. She reports she is ready to start eating better and exercising more. She has not gotten her COVID shots yet but is ready to proceed. Denies CP/palp/SOB/HA/congestion/fevers/GI or GU c/o. Taking meds as prescribed  Past Medical History:  Diagnosis Date  . History of miscarriage 09/20/2014   X 3    . Obesity 09/20/2014  . Pedal edema 02/27/2015  . Preventative health care 11/18/2014    Past Surgical History:  Procedure Laterality Date  . CERVICAL CERCLAGE N/A 04/25/2019   Procedure: CERCLAGE CERVICAL;  Surgeon: Noralee Space, MD;  Location: [facility] LD ORS;  Service: Gynecology;  Laterality: N/A;  request extra assist for holding retractors  . CERVICAL CERCLAGE N/A 06/13/2019   Procedure: CERCLAGE REMOVAL;  Surgeon: Noland Fordyce, MD;  Location: [facility];  Service: Gynecology;  Laterality: N/A;  . DILATION AND CURETTAGE OF UTERUS      Family History  Problem Relation Age of Onset  . Diabetes Maternal Grandmother   . Diabetes Paternal Grandmother   . Obesity Sister   . Renal Disease Brother        single kidney  . Dementia Paternal Grandfather   . Obesity Brother   . Cancer Maternal Aunt 85       breast    Social History   Socioeconomic History  . Marital status: Single    Spouse name: Not on file  . Number of children: Not on file  . Years of education: Not on file  . Highest education level: Not on file  Occupational History  . Not on file  Tobacco Use  . Smoking status: Former Smoker    Types: Cigars, Cigarettes  . Smokeless  tobacco: Former Neurosurgeon    Quit date: 12/19/2018  . Tobacco comment: black and milds  Vaping Use  . Vaping Use: Never used  Substance and Sexual Activity  . Alcohol use: Not Currently    Comment: occ  . Drug use: No  . Sexual activity: Yes    Birth control/protection: None  Other Topics Concern  . Not on file  Social History Narrative  . Not on file   Social Determinants of Health   Financial Resource Strain: Not on file  Food Insecurity: Not on file  Transportation Needs: Not on file  Physical Activity: Not on file  Stress: Not on file  Social Connections: Not on file  Intimate Partner Violence: Not on file    No outpatient medications prior to visit.   No facility-administered medications prior to visit.    No Known Allergies  Review of Systems  Constitutional: Negative for fever and malaise/fatigue.  HENT: Negative for congestion and hearing loss.   Eyes: Negative for blurred vision.  Respiratory: Negative for cough and shortness of breath.   Cardiovascular: Negative for chest pain, palpitations and leg swelling.  Gastrointestinal: Negative for abdominal pain, blood in stool and nausea.  Genitourinary: Negative for dysuria and frequency.  Musculoskeletal: Negative for falls.  Skin: Negative for rash.  Neurological:  Negative for dizziness, loss of consciousness and headaches.  Endo/Heme/Allergies: Negative for environmental allergies.  Psychiatric/Behavioral: Negative for depression. The patient is not nervous/anxious.        Objective:    Physical Exam  BP 130/69   Pulse 85   Temp 98.1 F (36.7 C) (Oral)   Resp 18   Ht 5\' 7"  (1.702 m)   Wt (!) 313 lb 6.4 oz (142.2 kg)   LMP 07/22/2020   SpO2 100%   BMI 49.09 kg/m  Wt Readings from Last 3 Encounters:  08/04/20 (!) 313 lb 6.4 oz (142.2 kg)  06/13/19 (!) 322 lb (146.1 kg)  04/26/19 (!) 325 lb (147.4 kg)    Diabetic Foot Exam - Simple   No data filed    Lab Results  Component Value Date   WBC 5.0  08/04/2020   HGB 12.6 08/04/2020   HCT 38.1 08/04/2020   PLT 242.0 08/04/2020   GLUCOSE 84 08/04/2020   CHOL 211 (H) 08/04/2020   TRIG 64.0 08/04/2020   HDL 43.10 08/04/2020   LDLCALC 155 (H) 08/04/2020   ALT 12 08/04/2020   AST 12 08/04/2020   NA 135 08/04/2020   K 4.7 08/04/2020   CL 102 08/04/2020   CREATININE 0.96 08/04/2020   BUN 13 08/04/2020   CO2 28 08/04/2020   TSH 1.73 08/04/2020   HGBA1C 6.1 08/04/2020    Lab Results  Component Value Date   TSH 1.73 08/04/2020   Lab Results  Component Value Date   WBC 5.0 08/04/2020   HGB 12.6 08/04/2020   HCT 38.1 08/04/2020   MCV 85.9 08/04/2020   PLT 242.0 08/04/2020   Lab Results  Component Value Date   NA 135 08/04/2020   K 4.7 08/04/2020   CO2 28 08/04/2020   GLUCOSE 84 08/04/2020   BUN 13 08/04/2020   CREATININE 0.96 08/04/2020   BILITOT 0.3 08/04/2020   ALKPHOS 52 08/04/2020   AST 12 08/04/2020   ALT 12 08/04/2020   PROT 7.1 08/04/2020   ALBUMIN 3.8 08/04/2020   CALCIUM 9.0 08/04/2020   ANIONGAP 7 06/08/2019   GFR 74.95 08/04/2020   Lab Results  Component Value Date   CHOL 211 (H) 08/04/2020   Lab Results  Component Value Date   HDL 43.10 08/04/2020   Lab Results  Component Value Date   LDLCALC 155 (H) 08/04/2020   Lab Results  Component Value Date   TRIG 64.0 08/04/2020   Lab Results  Component Value Date   CHOLHDL 5 08/04/2020   Lab Results  Component Value Date   HGBA1C 6.1 08/04/2020       Assessment & Plan:   Problem List Items Addressed This Visit    Preventative health care    Patient encouraged to maintain heart healthy diet, regular exercise, adequate sleep. Consider daily probiotics. Take medications as prescribed. Labs ordered and monitored      Relevant Orders   CBC (Completed)   TSH (Completed)   Hyperlipidemia    Encouraged heart healthy diet, increase exercise, avoid trans fats, consider a krill oil cap daily      Relevant Orders   Lipid panel (Completed)    TSH (Completed)   Morbid obesity (HCC)    Encouraged DASH or MIND diet, decrease po intake and increase exercise as tolerated. Needs 7-8 hours of sleep nightly. Avoid trans fats, eat small, frequent meals every 4-5 hours with lean proteins, complex carbs and healthy fats. Minimize simple carbs  Relevant Orders   TSH (Completed)   Educated about COVID-19 virus infection    She is ready to take the vaccination and will arrange soon       Other Visit Diagnoses    Hyperglycemia    -  Primary   Relevant Orders   Comprehensive metabolic panel (Completed)   TSH (Completed)   Hemoglobin A1c (Completed)      Sandy Love does not currently have medications on file.  No orders of the defined types were placed in this encounter.    Danise Edge, MD

## 2020-08-05 NOTE — Assessment & Plan Note (Signed)
She is ready to take the vaccination and will arrange soon

## 2020-08-23 ENCOUNTER — Ambulatory Visit: Payer: 59 | Attending: Internal Medicine

## 2020-08-23 ENCOUNTER — Other Ambulatory Visit (HOSPITAL_BASED_OUTPATIENT_CLINIC_OR_DEPARTMENT_OTHER): Payer: Self-pay | Admitting: Internal Medicine

## 2020-08-23 DIAGNOSIS — Z23 Encounter for immunization: Secondary | ICD-10-CM

## 2020-08-23 MED FILL — JANSSEN COVID-19 VACCINE 0.: 0.5 | 1 days supply | Qty: 1 | Fill #0

## 2020-08-23 NOTE — Progress Notes (Signed)
   Covid-19 Vaccination Clinic  Name:  GRACIA SAGGESE    MRN: [id number] DOB: [birth year]  08/23/2020  Ms. Audino was observed post Covid-19 immunization for 15 minutes without incident. She was provided with Vaccine Information Sheet and instruction to access the V-Safe system.  Vaccinated by Fredirick Maudlin  Ms. Greff was instructed to call 911 with any severe reactions post vaccine: Marland Kitchen Difficulty breathing  . Swelling of face and throat  . A fast heartbeat  . A bad rash all over body  . Dizziness and weakness   Immunizations Administered    Name Date Dose VIS Date Route   JANSSEN COVID-19 VACCINE 08/23/2020  9:12 AM 0.5 mL 06/08/2020 Intramuscular   Manufacturer: [employer]   Lot: [id number]   NDC: 68864-847-20

## 2020-09-01 ENCOUNTER — Ambulatory Visit: Payer: 59 | Admitting: Psychology

## 2020-09-15 DIAGNOSIS — H18612 Keratoconus, stable, left eye: Secondary | ICD-10-CM | POA: Diagnosis not present

## 2020-09-15 DIAGNOSIS — H18621 Keratoconus, unstable, right eye: Secondary | ICD-10-CM | POA: Diagnosis not present

## 2021-01-11 ENCOUNTER — Encounter: Payer: Self-pay | Admitting: Family Medicine

## 2021-02-02 ENCOUNTER — Ambulatory Visit: Payer: 59 | Admitting: Family Medicine

## 2021-02-02 ENCOUNTER — Other Ambulatory Visit: Payer: Self-pay

## 2021-02-02 DIAGNOSIS — E785 Hyperlipidemia, unspecified: Secondary | ICD-10-CM

## 2021-02-02 DIAGNOSIS — R739 Hyperglycemia, unspecified: Secondary | ICD-10-CM | POA: Diagnosis not present

## 2021-02-02 DIAGNOSIS — Z23 Encounter for immunization: Secondary | ICD-10-CM

## 2021-02-02 DIAGNOSIS — D649 Anemia, unspecified: Secondary | ICD-10-CM

## 2021-02-02 NOTE — Progress Notes (Signed)
Patient ID: Sandy Love, female    DOB: [birth year]  Age: 40 y.o. MRN: [id number]    Subjective:  Subjective  HPI Sandy Love presents for office visit today for follow up on weight management and hyperlipidemia. She states that her goal is to go down to 240 lbs and reports that she started working out. She states that it is challenging to eat a balanced healthy diet due to the stress of school and giving birth, but she is expressing interest in meeting a nutritionist to work on improving her eating habits. She denies any chest pain, SOB, fever, abdominal pain, cough, chills, sore throat, dysuria, urinary incontinence, back pain, HA, or N/VD. She states that her average systolic BP readings were around 120.   Review of Systems  Constitutional:  Negative for chills, fatigue and fever.  HENT:  Negative for congestion, rhinorrhea, sinus pressure, sinus pain and sore throat.   Eyes:  Negative for pain.  Respiratory:  Negative for cough and shortness of breath.   Cardiovascular:  Negative for chest pain, palpitations and leg swelling.  Gastrointestinal:  Negative for abdominal pain, blood in stool, diarrhea, nausea and vomiting.  Genitourinary:  Negative for decreased urine volume, flank pain, frequency, vaginal bleeding and vaginal discharge.  Musculoskeletal:  Negative for back pain.  Neurological:  Negative for headaches.   History Past Medical History:  Diagnosis Date   History of miscarriage 09/20/2014   X 3     Obesity 09/20/2014   Pedal edema 02/27/2015   Preventative health care 11/18/2014    She has a past surgical history that includes Dilation and curettage of uterus; Cervical cerclage (N/A, 04/25/2019); and Cervical cerclage (N/A, 06/13/2019).   Her family history includes Cancer (age of onset: 98) in her maternal aunt; Dementia in her paternal grandfather; Diabetes in her maternal grandmother and paternal grandmother; Obesity in her brother and sister; Renal Disease in her  brother.She reports that she has quit smoking. Her smoking use included cigars and cigarettes. She quit smokeless tobacco use about 2 years ago. She reports previous alcohol use. She reports that she does not use drugs.  No current outpatient medications on file prior to visit.   No current facility-administered medications on file prior to visit.     Objective:  Objective  Physical Exam Constitutional:      General: She is not in acute distress.    Appearance: Normal appearance. She is not ill-appearing or toxic-appearing.  HENT:     Head: Normocephalic and atraumatic.     Right Ear: Tympanic membrane, ear canal and external ear normal.     Left Ear: Tympanic membrane, ear canal and external ear normal.     Nose: No congestion or rhinorrhea.  Eyes:     Extraocular Movements: Extraocular movements intact.     Pupils: Pupils are equal, round, and reactive to light.  Cardiovascular:     Rate and Rhythm: Normal rate and regular rhythm.     Pulses: Normal pulses.     Heart sounds: Normal heart sounds. No murmur heard. Pulmonary:     Effort: Pulmonary effort is normal. No respiratory distress.     Breath sounds: Normal breath sounds. No wheezing, rhonchi or rales.  Abdominal:     General: Bowel sounds are normal.     Palpations: Abdomen is soft. There is no mass.     Tenderness: no abdominal tenderness There is no guarding.     Hernia: No hernia is present.  Musculoskeletal:  General: Normal range of motion.     Cervical back: Normal range of motion and neck supple.  Skin:    General: Skin is warm and dry.  Neurological:     Mental Status: She is alert and oriented to person, place, and time.  Psychiatric:        Behavior: Behavior normal.   BP (!) 142/82 (BP Location: Left Arm, Patient Position: Sitting, Cuff Size: Large)   Pulse (!) 112   Temp 98.2 F (36.8 C) (Oral)   Resp 18   Ht 5\' 7"  (1.702 m)   Wt 293 lb 3.2 oz (133 kg)   SpO2 98%   BMI 45.92 kg/m  Wt  Readings from Last 3 Encounters:  02/02/21 293 lb 3.2 oz (133 kg)  08/04/20 (!) 313 lb 6.4 oz (142.2 kg)  06/13/19 (!) 322 lb (146.1 kg)     Lab Results  Component Value Date   WBC 5.0 08/04/2020   HGB 12.6 08/04/2020   HCT 38.1 08/04/2020   PLT 242.0 08/04/2020   GLUCOSE 84 08/04/2020   CHOL 211 (H) 08/04/2020   TRIG 64.0 08/04/2020   HDL 43.10 08/04/2020   LDLCALC 155 (H) 08/04/2020   ALT 12 08/04/2020   AST 12 08/04/2020   NA 135 08/04/2020   K 4.7 08/04/2020   CL 102 08/04/2020   CREATININE 0.96 08/04/2020   BUN 13 08/04/2020   CO2 28 08/04/2020   TSH 1.73 08/04/2020   HGBA1C 6.1 08/04/2020    No results found.   Assessment & Plan:  Plan    No orders of the defined types were placed in this encounter.   Problem List Items Addressed This Visit     Hyperlipidemia    Encourage heart healthy diet such as MIND or DASH diet, increase exercise, avoid trans fats, simple carbohydrates and processed foods, consider a krill or fish or flaxseed oil cap daily.        Relevant Orders   Comprehensive metabolic panel   TSH   Anemia    Increase leafy greens, consider increased lean red meat and using cast iron cookware. Continue to monitor, report any concerns       Relevant Orders   CBC   Morbid obesity (HCC) - Primary    Encouraged DASH or MIND diet, decrease po intake and increase exercise as tolerated. Needs 7-8 hours of sleep nightly. Avoid trans fats, eat small, frequent meals every 4-5 hours with lean proteins, complex carbs and healthy fats. Minimize simple carbs, high fat foods and processed foods. 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       Relevant Orders   Hemoglobin A1c   Comprehensive metabolic panel   Lipid panel   TSH    Follow-up: Return lab appt 9/16 to 9/28 then she already appt 9/29 please add note for ear wax removal.   I,David Hanna,acting as a scribe for 10/29, MD.,have documented all relevant documentation on the behalf of Danise Edge, MD,as directed by  Danise Edge, MD while in the presence of Danise Edge, MD.  I, Danise Edge,  MD personally performed the services described in this documentation. All medical record entries made by the scribe were at my direction and in my presence. I have reviewed the chart and agree that the record reflects my personal performance and is accurate and complete

## 2021-02-02 NOTE — Patient Instructions (Signed)
Wegovy and Saxenda for weight loss

## 2021-02-03 DIAGNOSIS — R739 Hyperglycemia, unspecified: Secondary | ICD-10-CM | POA: Insufficient documentation

## 2021-02-03 DIAGNOSIS — E119 Type 2 diabetes mellitus without complications: Secondary | ICD-10-CM | POA: Insufficient documentation

## 2021-02-03 DIAGNOSIS — Z23 Encounter for immunization: Secondary | ICD-10-CM | POA: Insufficient documentation

## 2021-02-03 LAB — VARICELLA ZOSTER ANTIBODY, IGG: Varicella IgG: 1049 index

## 2021-02-03 NOTE — Assessment & Plan Note (Signed)
Encourage heart healthy diet such as MIND or DASH diet, increase exercise, avoid trans fats, simple carbohydrates and processed foods, consider a krill or fish or flaxseed oil cap daily.  °

## 2021-02-03 NOTE — Assessment & Plan Note (Signed)
hgba1c acceptable, minimize simple carbs.  

## 2021-02-03 NOTE — Assessment & Plan Note (Signed)
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

## 2021-02-03 NOTE — Assessment & Plan Note (Signed)
Increase leafy greens, consider increased lean red meat and using cast iron cookware. Continue to monitor, report any concerns 

## 2021-02-03 NOTE — Assessment & Plan Note (Addendum)
Encouraged DASH or MIND diet, decrease po intake and increase exercise as tolerated. Needs 7-8 hours of sleep nightly. Avoid trans fats, eat small, frequent meals every 4-5 hours with lean proteins, complex carbs and healthy fats. Minimize simple carbs, high fat foods and processed foods. Referred to nutritionist for further input and education

## 2021-03-22 ENCOUNTER — Ambulatory Visit: Payer: 59 | Admitting: Dietician

## 2021-04-28 DIAGNOSIS — Z01419 Encounter for gynecological examination (general) (routine) without abnormal findings: Secondary | ICD-10-CM | POA: Diagnosis not present

## 2021-04-28 DIAGNOSIS — Z124 Encounter for screening for malignant neoplasm of cervix: Secondary | ICD-10-CM | POA: Diagnosis not present

## 2021-04-28 DIAGNOSIS — Z01411 Encounter for gynecological examination (general) (routine) with abnormal findings: Secondary | ICD-10-CM | POA: Diagnosis not present

## 2021-04-28 DIAGNOSIS — Z113 Encounter for screening for infections with a predominantly sexual mode of transmission: Secondary | ICD-10-CM | POA: Diagnosis not present

## 2021-04-28 DIAGNOSIS — Z6841 Body Mass Index (BMI) 40.0 and over, adult: Secondary | ICD-10-CM | POA: Diagnosis not present

## 2021-05-23 ENCOUNTER — Other Ambulatory Visit: Payer: Self-pay

## 2021-05-23 ENCOUNTER — Encounter: Payer: Self-pay | Admitting: Dietician

## 2021-05-23 ENCOUNTER — Encounter: Payer: 59 | Attending: Family Medicine | Admitting: Dietician

## 2021-05-23 NOTE — Progress Notes (Signed)
Medical Nutrition Therapy  Appointment Start time:  [phone]  Appointment End time:  1000  Primary concerns today: Weight Loss  Referral diagnosis: E66.01 - Morbid Obesity Preferred learning style: No preference indicated Learning readiness: Ready   NUTRITION ASSESSMENT   Anthropometrics  Ht: 5'7" Wt: 294.4 lbs Body mass index is 46.11 kg/m.   Clinical Medical Hx: HLD, Anemia, Hyperglycemia Medications: None Labs: TC - 211, LDL - 155, A1c - 6.1 Notable Signs/Symptoms: N/A  Lifestyle & Dietary Hx Pt states their sister encouraged them to come to see a dietitian, sister is currently in a weight loss program and is having success. Pt reports gaining weight during pregnancy and has had trouble keeping it off. Pt has tried IF to lose weight, but was unable to sustain it. Pt has a goal weight of 200 lbs by April, 2023. Pt is very motivated to make changes. 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 Pt is also taking evening classes to get into nursing school at [employer]. Pt reports having very busy days working, taking care of their infant son, and then has to study. Pt reports "comfort eating" when they get stressed or busy studying.  Pt wants accountability, states they struggle with consistency.   Pt walks a mile about once a week, does 30 minutes of yoga, and will do some cardio between calls at work. Pt reports activity level has gone down with the recent change in weather, Pt occasionally misses breakfast, usually misses lunch, and eats dinner consistently. Pt reports making fruit smoothies  Estimated daily fluid intake: 128 oz Supplements: N/A Sleep: Sleeps a good 7 hours Stress / self-care: School  Current average weekly physical activity: Walks, yoga, occasionally cardio  24-Hr Dietary Recall First Meal: 2 scrambled eggs, cheddar cheese, 2 slices wheat toast Snack: none Second Meal: No lunch Snack: none Third Meal: 2 PB and J sandwiches, [place] fries Snack:  none Beverages: water   NUTRITION DIAGNOSIS  NB-1.1 Food and nutrition-related knowledge deficit As related to Obesity.  As evidenced by BMI of 46.11 kg/m2, sedentary work life, skipping meals, and self-reported emotional eating .   NUTRITION INTERVENTION  Nutrition education (E-1) on the following topics:  Counseled patient on beginning to rebuild their trust in themselves to make the right food choices for their health. Educated patient on mindful eating, including listening to their body's hunger and satiety cues, as well as eating slowly and allowing meals to be more of a sensory experience. Counseled patient on allowing themselves to be present in their emotions when they consider emotional eating. Advised patient to evaluate whether the impulse to eat is hunger based, or emotionally driven. Educated patient on energy balance. Discussed ways to incorporate more physical activity, lower calorie consumption through nutrient dense foods, and balancing these two strategies in a sustainable way.   Handouts Provided Include  N/A  Learning Style & Readiness for Change Teaching method utilized: Visual & Auditory  Demonstrated degree of understanding via: Teach Back  Barriers to learning/adherence to lifestyle change: Busy   Goals Established by Pt When you find yourself comfort eating, begin to look at the series of events that led up to your feeling the need to eat something sweet in the evening.  Try having some Cool-Whip with berries in the evening. Look for "Steam In Bag" frozen vegetables for a quick, easy way  If making a smoothie, use 1 cup of fruit, 1 cup of fat-free greek yogurt or milk, and  a handful of spinach or kale. Look for PB2 powdered peanut butter in the grocery store. Add it to your smoothies, or mix it with jelly for your PB and J sandwich. Begin to write down a log each day of whether you had breakfast, lunch, dinner, and/or an evening snack. Look for patterns in your  meal consumption.   MONITORING & EVALUATION Dietary intake, weekly physical activity, and meal pattern in 2 months.  Next Steps  Patient is to follow up with RD.

## 2021-05-23 NOTE — Patient Instructions (Addendum)
When you find yourself comfort eating, begin to look at the series of events that led up to your feeling the need to eat something sweet in the evening.   Try having some Cool-Whip with berries in the evening.  Look for "Steam In Bag" frozen vegetables for a quick, easy way   If making a smoothie, use 1 cup of fruit, 1 cup of fat-free greek yogurt or milk, and a handful of spinach or kale.  Look for PB2 powdered peanut butter in the grocery store. Add it to your smoothies, or mix it with jelly for your PB and J sandwich.  Begin to write down a log each day of whether you had breakfast, lunch, dinner, and/or an evening snack. Look for patterns in your meal consumption.

## 2021-06-02 ENCOUNTER — Other Ambulatory Visit: Payer: Self-pay

## 2021-06-02 ENCOUNTER — Ambulatory Visit (INDEPENDENT_AMBULATORY_CARE_PROVIDER_SITE_OTHER): Payer: 59

## 2021-06-02 DIAGNOSIS — Z23 Encounter for immunization: Secondary | ICD-10-CM | POA: Diagnosis not present

## 2021-06-02 NOTE — Progress Notes (Deleted)
Sandy Love is a 40 y.o. female presents to the office today for Regular dose flu shot, per physician's orders. Original order: 06/02/21- Dr Abner Greenspan Regular dose flu shot,  IM (route) was administered *** Deltoid (location) today. Patient tolerated injection. Patient due for follow up labs/provider appt: {yes/no:20286}. Date due: ***, appt made {yes/no:20286} Patient next injection due: ***, appt made {yes/no:20286}   Given VIS and Consent form.   Creft, Feliberto Harts

## 2021-06-21 ENCOUNTER — Other Ambulatory Visit (HOSPITAL_COMMUNITY): Payer: Self-pay

## 2021-06-21 MED ORDER — SODIUM FLUORIDE 5000 SENSITIVE 1.1-5 % DT GEL
DENTAL | 5 refills | Status: DC
  Filled 2021-06-21: qty 100, 30d supply, fill #0

## 2021-08-01 ENCOUNTER — Ambulatory Visit: Payer: 59 | Admitting: Dietician

## 2021-08-10 ENCOUNTER — Ambulatory Visit (INDEPENDENT_AMBULATORY_CARE_PROVIDER_SITE_OTHER): Payer: 59 | Admitting: Family Medicine

## 2021-08-10 ENCOUNTER — Other Ambulatory Visit: Payer: Self-pay

## 2021-08-10 ENCOUNTER — Encounter: Payer: Self-pay | Admitting: Family Medicine

## 2021-08-10 VITALS — BP 118/84 | HR 103 | Temp 98.1°F | Resp 16 | Ht 67.0 in | Wt 291.6 lb

## 2021-08-10 DIAGNOSIS — Z7189 Other specified counseling: Secondary | ICD-10-CM | POA: Diagnosis not present

## 2021-08-10 DIAGNOSIS — E785 Hyperlipidemia, unspecified: Secondary | ICD-10-CM | POA: Diagnosis not present

## 2021-08-10 DIAGNOSIS — Z0000 Encounter for general adult medical examination without abnormal findings: Secondary | ICD-10-CM | POA: Diagnosis not present

## 2021-08-10 DIAGNOSIS — Z1159 Encounter for screening for other viral diseases: Secondary | ICD-10-CM

## 2021-08-10 DIAGNOSIS — D649 Anemia, unspecified: Secondary | ICD-10-CM

## 2021-08-10 DIAGNOSIS — R739 Hyperglycemia, unspecified: Secondary | ICD-10-CM | POA: Diagnosis not present

## 2021-08-10 DIAGNOSIS — D72829 Elevated white blood cell count, unspecified: Secondary | ICD-10-CM

## 2021-08-10 LAB — LIPID PANEL
Cholesterol: 221 mg/dL — ABNORMAL HIGH (ref 0–200)
HDL: 52.2 mg/dL (ref >39.00)
LDL Cholesterol: 151 mg/dL — ABNORMAL HIGH (ref 0–99)
NonHDL: 168.63
Total CHOL/HDL Ratio: 4
Triglycerides: 88 mg/dL (ref 0.0–149.0)
VLDL: 17.6 mg/dL (ref 0.0–40.0)

## 2021-08-10 LAB — CBC
HCT: 40.5 % (ref 36.0–46.0)
Hemoglobin: 13.4 g/dL (ref 12.0–15.0)
MCHC: 33.2 g/dL (ref 30.0–36.0)
MCV: 88.1 fl (ref 78.0–100.0)
Platelets: 215 10*3/uL (ref 150.0–400.0)
RBC: 4.59 Mil/uL (ref 3.87–5.11)
RDW: 15.3 % (ref 11.5–15.5)
WBC: 3.7 10*3/uL — ABNORMAL LOW (ref 4.0–10.5)

## 2021-08-10 LAB — COMPREHENSIVE METABOLIC PANEL
ALT: 11 U/L (ref 0–35)
AST: 13 U/L (ref 0–37)
Albumin: 3.9 g/dL (ref 3.5–5.2)
Alkaline Phosphatase: 55 U/L (ref 39–117)
BUN: 10 mg/dL (ref 6–23)
CO2: 30 mEq/L (ref 19–32)
Calcium: 9.5 mg/dL (ref 8.4–10.5)
Chloride: 101 mEq/L (ref 96–112)
Creatinine, Ser: 0.88 mg/dL (ref 0.40–1.20)
GFR: 82.61 mL/min (ref >60.00)
Glucose, Bld: 85 mg/dL (ref 70–99)
Potassium: 4.8 mEq/L (ref 3.5–5.1)
Sodium: 136 mEq/L (ref 135–145)
Total Bilirubin: 0.4 mg/dL (ref 0.2–1.2)
Total Protein: 6.9 g/dL (ref 6.0–8.3)

## 2021-08-10 LAB — TSH: TSH: 2.3 u[IU]/mL (ref 0.35–5.50)

## 2021-08-10 LAB — HEMOGLOBIN A1C: Hgb A1c MFr Bld: 5.9 % (ref 4.6–6.5)

## 2021-08-10 NOTE — Assessment & Plan Note (Signed)
Encouraged DASH or MIND diet, decrease po intake and increase exercise as tolerated. Needs 7-8 hours of sleep nightly. Avoid trans fats, eat small, frequent meals every 4-5 hours with lean proteins, complex carbs and healthy fats. Minimize simple carbs, high fat foods and processed foods. Consider Wegovy or Saxenda °

## 2021-08-10 NOTE — Patient Instructions (Signed)
WYOVZC (weekly) or Saxenda (daily) for weight loss check with insurance   Preventive Care 77-39 Years Old Old, Female Preventive care refers to lifestyle choices and visits with your health care provider that can promote health and wellness. Preventive care visits are also called wellness exams. What can I expect for my preventive care visit? Counseling During your preventive care visit, your health care provider may ask about your: Medical history, including: Past medical problems. Family medical history. Pregnancy history. Current health, including: Menstrual cycle. Method of birth control. Emotional well-being. Home life and relationship well-being. Sexual activity and sexual health. Lifestyle, including: Alcohol, nicotine or tobacco, and drug use. Access to firearms. Diet, exercise, and sleep habits. Work and work Statistician. Sunscreen use. Safety issues such as seatbelt and bike helmet use. Physical exam Your health care provider may check your: Height and weight. These may be used to calculate your BMI (body mass index). BMI is a measurement that tells if you are at a healthy weight. Waist circumference. This measures the distance around your waistline. This measurement also tells if you are at a healthy weight and may help predict your risk of certain diseases, such as type 2 diabetes and high blood pressure. Heart rate and blood pressure. Body temperature. Skin for abnormal spots. What immunizations do I need? Vaccines are usually given at various ages, according to a schedule. Your health care provider will recommend vaccines for you based on your age, medical history, and lifestyle or other factors, such as travel or where you work. What tests do I need? Screening Your health care provider may recommend screening tests for certain conditions. This may include: Pelvic exam and Pap test. Lipid and cholesterol levels. Diabetes screening. This is done by checking your blood  sugar (glucose) after you have not eaten for a while (fasting). Hepatitis B test. Hepatitis C test. HIV (human immunodeficiency virus) test. STI (sexually transmitted infection) testing, if you are at risk. BRCA-related cancer screening. This may be done if you have a family history of breast, ovarian, tubal, or peritoneal cancers. Talk with your health care provider about your test results, treatment options, and if necessary, the need for more tests. Follow these instructions at home: Eating and drinking  Eat a healthy diet that includes fresh fruits and vegetables, whole grains, lean protein, and low-fat dairy products. Take vitamin and mineral supplements as recommended by your health care provider. Do not drink alcohol if: Your health care provider tells you not to drink. You are pregnant, may be pregnant, or are planning to become pregnant. If you drink alcohol: Limit how much you have to 0-1 drink a day. Know how much alcohol is in your drink. In the [place] one drink equals one 12 oz bottle of beer (355 mL), one 5 oz glass of wine (148 mL), or one 1 oz glass of hard liquor (44 mL). Lifestyle Brush your teeth every morning and night with fluoride toothpaste. Floss one time each day. Exercise for at least 30 minutes 5 or more days each week. Do not use any products that contain nicotine or tobacco. These products include cigarettes, chewing tobacco, and vaping devices, such as e-cigarettes. If you need help quitting, ask your health care provider. Do not use drugs. If you are sexually active, practice safe sex. Use a condom or other form of protection to prevent STIs. If you do not wish to become pregnant, use a form of birth control. If you plan to become pregnant, see your health care provider for  a prepregnancy visit. Find healthy ways to manage stress, such as: Meditation, yoga, or listening to music. Journaling. Talking to a trusted person. Spending time with friends and  family. Minimize exposure to UV radiation to reduce your risk of skin cancer. Safety Always wear your seat belt while driving or riding in a vehicle. Do not drive: If you have been drinking alcohol. Do not ride with someone who has been drinking. If you have been using any mind-altering substances or drugs. While texting. When you are tired or distracted. Wear a helmet and other protective equipment during sports activities. If you have firearms in your house, make sure you follow all gun safety procedures. Seek help if you have been physically or sexually abused. What's next? Go to your health care provider once a year for an annual wellness visit. Ask your health care provider how often you should have your eyes and teeth checked. Stay up to date on all vaccines. This information is not intended to replace advice given to you by your health care provider. Make sure you discuss any questions you have with your health care provider. Document Revised: 02/01/2021 Document Reviewed: 02/01/2021 Elsevier Patient Education  [place].

## 2021-08-10 NOTE — Assessment & Plan Note (Signed)
Encourage heart healthy diet such as MIND or DASH diet, increase exercise, avoid trans fats, simple carbohydrates and processed foods, consider a krill or fish or flaxseed oil cap daily.  °

## 2021-08-10 NOTE — Progress Notes (Signed)
Subjective:    Patient ID: Sandy Love, female    DOB: 23-Jul-1981, 40 y.o.   MRN: [id number]  Chief Complaint  Patient presents with   Annual Exam    HPI Patient is in today for annual preventative exam. No recent febrile illness or hospitalizations. She does her paps with Dr Si Raider of GYN. She is working for [employer] from home and is doing well. Is working on getting into nursing school. She is frustrated with some dark spots from shaving on her neck but they are not irritated or red. Denies CP/palp/SOB/HA/congestion/fevers/GI or GU c/o. Taking meds as prescribed   Past Medical History:  Diagnosis Date   History of miscarriage 09/20/2014   X 3     Obesity 09/20/2014   Pedal edema 02/27/2015   Preventative health care 11/18/2014    Past Surgical History:  Procedure Laterality Date   CERVICAL CERCLAGE N/A 04/25/2019   Procedure: CERCLAGE CERVICAL;  Surgeon: Tama High, MD;  Location: [facility];  Service: Gynecology;  Laterality: N/A;  request extra assist for holding retractors   CERVICAL CERCLAGE N/A 06/13/2019   Procedure: CERCLAGE REMOVAL;  Surgeon: Aloha Gell, MD;  Location: [facility];  Service: Gynecology;  Laterality: N/A;   DILATION AND CURETTAGE OF UTERUS      Family History  Problem Relation Age of Onset   Diabetes Maternal Grandmother    Diabetes Paternal Grandmother    Obesity Sister    Renal Disease Brother        single kidney   Dementia Paternal Grandfather    Obesity Brother    Cancer Maternal Aunt 93       breast    Social History   Socioeconomic History   Marital status: Single    Spouse name: Not on file   Number of children: Not on file   Years of education: Not on file   Highest education level: Not on file  Occupational History   Not on file  Tobacco Use   Smoking status: Former    Types: Cigars, Cigarettes   Smokeless tobacco: Former    Quit date: 12/19/2018   Tobacco comments:    black and milds  Vaping Use   Vaping Use: Never used   Substance and Sexual Activity   Alcohol use: Not Currently    Comment: occ   Drug use: No   Sexual activity: Yes    Birth control/protection: None  Other Topics Concern   Not on file  Social History Narrative   Not on file   Social Determinants of Health   Financial Resource Strain: Not on file  Food Insecurity: Not on file  Transportation Needs: Not on file  Physical Activity: Not on file  Stress: Not on file  Social Connections: Not on file  Intimate Partner Violence: Not on file    Outpatient Medications Prior to Visit  Medication Sig Dispense Refill   Sod Fluoride-Potassium Nitrate (SODIUM FLUORIDE 5000 SENSITIVE) 1.1-5 % GEL Use as directed at night. 100 mL 5   No facility-administered medications prior to visit.    No Known Allergies  Review of Systems  Constitutional:  Negative for chills, fever and malaise/fatigue.  HENT:  Negative for congestion and hearing loss.   Eyes:  Negative for discharge.  Respiratory:  Negative for cough, sputum production and shortness of breath.   Cardiovascular:  Negative for chest pain, palpitations and leg swelling.  Gastrointestinal:  Negative for abdominal pain, blood in stool, constipation, diarrhea, heartburn, nausea  and vomiting.  Genitourinary:  Negative for dysuria, frequency, hematuria and urgency.  Musculoskeletal:  Negative for back pain, falls and myalgias.  Skin:  Negative for rash.  Neurological:  Negative for dizziness, sensory change, loss of consciousness, weakness and headaches.  Endo/Heme/Allergies:  Negative for environmental allergies. Does not bruise/bleed easily.  Psychiatric/Behavioral:  Negative for depression and suicidal ideas. The patient is not nervous/anxious and does not have insomnia.       Objective:    Physical Exam Constitutional:      General: She is not in acute distress.    Appearance: She is well-developed.  HENT:     Head: Normocephalic and atraumatic.  Eyes:     Conjunctiva/sclera:  Conjunctivae normal.  Neck:     Thyroid: No thyromegaly.  Cardiovascular:     Rate and Rhythm: Normal rate and regular rhythm.     Heart sounds: Normal heart sounds. No murmur heard. Pulmonary:     Effort: Pulmonary effort is normal. No respiratory distress.     Breath sounds: Normal breath sounds.  Abdominal:     General: Bowel sounds are normal. There is no distension.     Palpations: Abdomen is soft. There is no mass.     Tenderness: There is no abdominal tenderness.  Musculoskeletal:     Cervical back: Neck supple.  Lymphadenopathy:     Cervical: No cervical adenopathy.  Skin:    General: Skin is warm and dry.     Findings: Rash present.     Comments: Small, flat, dark spots on anterior neck  Neurological:     Mental Status: She is alert and oriented to person, place, and time.  Psychiatric:        Behavior: Behavior normal.    BP 118/84    Pulse (!) 103    Temp 98.1 F (36.7 C)    Resp 16    Ht 5\' 7"  (1.702 m)    Wt 291 lb 9.6 oz (132.3 kg)    SpO2 98%    BMI 45.67 kg/m  Wt Readings from Last 3 Encounters:  08/10/21 291 lb 9.6 oz (132.3 kg)  05/23/21 294 lb 6.4 oz (133.5 kg)  02/02/21 293 lb 3.2 oz (133 kg)    Diabetic Foot Exam - Simple   No data filed    Lab Results  Component Value Date   WBC 5.0 08/04/2020   HGB 12.6 08/04/2020   HCT 38.1 08/04/2020   PLT 242.0 08/04/2020   GLUCOSE 84 08/04/2020   CHOL 211 (H) 08/04/2020   TRIG 64.0 08/04/2020   HDL 43.10 08/04/2020   LDLCALC 155 (H) 08/04/2020   ALT 12 08/04/2020   AST 12 08/04/2020   NA 135 08/04/2020   K 4.7 08/04/2020   CL 102 08/04/2020   CREATININE 0.96 08/04/2020   BUN 13 08/04/2020   CO2 28 08/04/2020   TSH 1.73 08/04/2020   HGBA1C 6.1 08/04/2020    Lab Results  Component Value Date   TSH 1.73 08/04/2020   Lab Results  Component Value Date   WBC 5.0 08/04/2020   HGB 12.6 08/04/2020   HCT 38.1 08/04/2020   MCV 85.9 08/04/2020   PLT 242.0 08/04/2020   Lab Results  Component  Value Date   NA 135 08/04/2020   K 4.7 08/04/2020   CO2 28 08/04/2020   GLUCOSE 84 08/04/2020   BUN 13 08/04/2020   CREATININE 0.96 08/04/2020   BILITOT 0.3 08/04/2020   ALKPHOS 52 08/04/2020   AST  12 08/04/2020   ALT 12 08/04/2020   PROT 7.1 08/04/2020   ALBUMIN 3.8 08/04/2020   CALCIUM 9.0 08/04/2020   ANIONGAP 7 06/08/2019   GFR 74.95 08/04/2020   Lab Results  Component Value Date   CHOL 211 (H) 08/04/2020   Lab Results  Component Value Date   HDL 43.10 08/04/2020   Lab Results  Component Value Date   LDLCALC 155 (H) 08/04/2020   Lab Results  Component Value Date   TRIG 64.0 08/04/2020   Lab Results  Component Value Date   CHOLHDL 5 08/04/2020   Lab Results  Component Value Date   HGBA1C 6.1 08/04/2020       Assessment & Plan:   Problem List Items Addressed This Visit     Preventative health care - Primary    Patient encouraged to maintain heart healthy diet, regular exercise, adequate sleep. Consider daily probiotics. Take medications as prescribed. Labs ordered and reviewed. She had her pap with GYN with Dr Si Raider, will request.      Relevant Orders   CBC   Comprehensive metabolic panel   TSH   Hyperlipidemia    Encourage heart healthy diet such as MIND or DASH diet, increase exercise, avoid trans fats, simple carbohydrates and processed foods, consider a krill or fish or flaxseed oil cap daily.       Relevant Orders   Lipid panel   Anemia   Morbid obesity ([place])    Encouraged DASH or MIND diet, decrease po intake and increase exercise as tolerated. Needs 7-8 hours of sleep nightly. Avoid trans fats, eat small, frequent meals every 4-5 hours with lean proteins, complex carbs and healthy fats. Minimize simple carbs, high fat foods and processed foods. Consider [phone] or Saxenda      Educated about COVID-19 virus infection    Consider Bivalent VACCINE      Hyperglycemia   Relevant Orders   Hemoglobin A1c   Other Visit Diagnoses      Encounter for hepatitis C screening test for low risk patient       Relevant Orders   Hepatitis C Antibody       I have discontinued Alauna M. Huynh's Sodium Fluoride 5000 Sensitive.  No orders of the defined types were placed in this encounter.    Penni Homans, MD

## 2021-08-10 NOTE — Assessment & Plan Note (Addendum)
Patient encouraged to maintain heart healthy diet, regular exercise, adequate sleep. Consider daily probiotics. Take medications as prescribed. Labs ordered and reviewed. She had her pap with GYN with Dr Verlee Monte, will request.

## 2021-08-10 NOTE — Assessment & Plan Note (Signed)
Consider Bivalent VACCINE

## 2021-08-11 LAB — HEPATITIS C ANTIBODY
Hepatitis C Ab: NONREACTIVE
SIGNAL TO CUT-OFF: <0.02 (ref <1.00)

## 2021-08-16 ENCOUNTER — Encounter: Payer: Self-pay | Admitting: Family Medicine

## 2021-08-23 ENCOUNTER — Telehealth: Payer: Self-pay | Admitting: *Deleted

## 2021-08-23 DIAGNOSIS — R748 Abnormal levels of other serum enzymes: Secondary | ICD-10-CM

## 2021-08-23 NOTE — Telephone Encounter (Signed)
Mychart sent to patient.

## 2021-08-29 ENCOUNTER — Other Ambulatory Visit: Payer: Self-pay | Admitting: *Deleted

## 2021-08-29 DIAGNOSIS — E785 Hyperlipidemia, unspecified: Secondary | ICD-10-CM

## 2021-09-13 DIAGNOSIS — H35413 Lattice degeneration of retina, bilateral: Secondary | ICD-10-CM | POA: Diagnosis not present

## 2021-09-13 DIAGNOSIS — H33322 Round hole, left eye: Secondary | ICD-10-CM | POA: Diagnosis not present

## 2021-09-13 DIAGNOSIS — H18623 Keratoconus, unstable, bilateral: Secondary | ICD-10-CM | POA: Diagnosis not present

## 2021-12-20 ENCOUNTER — Encounter: Payer: Self-pay | Admitting: Family Medicine

## 2021-12-25 ENCOUNTER — Ambulatory Visit (INDEPENDENT_AMBULATORY_CARE_PROVIDER_SITE_OTHER): Payer: 59

## 2021-12-25 DIAGNOSIS — Z111 Encounter for screening for respiratory tuberculosis: Secondary | ICD-10-CM

## 2021-12-27 ENCOUNTER — Ambulatory Visit: Payer: 59

## 2021-12-27 LAB — TB SKIN TEST
Induration: 0 mm
TB Skin Test: NEGATIVE

## 2022-02-19 ENCOUNTER — Ambulatory Visit: Payer: 59 | Admitting: Family Medicine

## 2022-02-28 NOTE — Progress Notes (Deleted)
Subjective:    Patient ID: Sandy Love, female    DOB: 09-25-1980, 41 y.o.   MRN: [id number]  No chief complaint on file.   HPI Patient is in today for a follow up.  Past Medical History:  Diagnosis Date   History of miscarriage 09/20/2014   X 3     Obesity 09/20/2014   Pedal edema 02/27/2015   Preventative health care 11/18/2014    Past Surgical History:  Procedure Laterality Date   CERVICAL CERCLAGE N/A 04/25/2019   Procedure: CERCLAGE CERVICAL;  Surgeon: Noralee Space, MD;  Location: [facility];  Service: Gynecology;  Laterality: N/A;  request extra assist for holding retractors   CERVICAL CERCLAGE N/A 06/13/2019   Procedure: CERCLAGE REMOVAL;  Surgeon: Noland Fordyce, MD;  Location: [facility];  Service: Gynecology;  Laterality: N/A;   DILATION AND CURETTAGE OF UTERUS      Family History  Problem Relation Age of Onset   Diabetes Maternal Grandmother    Diabetes Paternal Grandmother    Obesity Sister    Renal Disease Brother        single kidney   Dementia Paternal Grandfather    Obesity Brother    Cancer Maternal Aunt 35       breast    Social History   Socioeconomic History   Marital status: Single    Spouse name: Not on file   Number of children: Not on file   Years of education: Not on file   Highest education level: Not on file  Occupational History   Not on file  Tobacco Use   Smoking status: Former    Types: Cigars, Cigarettes   Smokeless tobacco: Former    Quit date: 12/19/2018   Tobacco comments:    black and milds  Vaping Use   Vaping Use: Never used  Substance and Sexual Activity   Alcohol use: Not Currently    Comment: occ   Drug use: No   Sexual activity: Yes    Birth control/protection: None  Other Topics Concern   Not on file  Social History Narrative   Not on file   Social Determinants of Health   Financial Resource Strain: Low Risk  (04/24/2019)   Overall Financial Resource Strain (CARDIA)    Difficulty of Paying Living Expenses:  Not hard at all  Food Insecurity: No Food Insecurity (04/24/2019)   Hunger Vital Sign    Worried About Running Out of Food in the Last Year: Never true    Ran Out of Food in the Last Year: Never true  Transportation Needs: No Transportation Needs (04/24/2019)   PRAPARE - Administrator, Civil Service (Medical): No    Lack of Transportation (Non-Medical): No  Physical Activity: Not on file  Stress: Not on file  Social Connections: Not on file  Intimate Partner Violence: Not on file    No outpatient medications prior to visit.   No facility-administered medications prior to visit.    No Known Allergies  ROS     Objective:    Physical Exam  There were no vitals taken for this visit. Wt Readings from Last 3 Encounters:  08/10/21 291 lb 9.6 oz (132.3 kg)  05/23/21 294 lb 6.4 oz (133.5 kg)  02/02/21 293 lb 3.2 oz (133 kg)    Diabetic Foot Exam - Simple   No data filed    Lab Results  Component Value Date   WBC 3.7 (L) 08/10/2021   HGB 13.4  08/10/2021   HCT 40.5 08/10/2021   PLT 215.0 08/10/2021   GLUCOSE 85 08/10/2021   CHOL 221 (H) 08/10/2021   TRIG 88.0 08/10/2021   HDL 52.20 08/10/2021   LDLCALC 151 (H) 08/10/2021   ALT 11 08/10/2021   AST 13 08/10/2021   NA 136 08/10/2021   K 4.8 08/10/2021   CL 101 08/10/2021   CREATININE 0.88 08/10/2021   BUN 10 08/10/2021   CO2 30 08/10/2021   TSH 2.30 08/10/2021   HGBA1C 5.9 08/10/2021    Lab Results  Component Value Date   TSH 2.30 08/10/2021   Lab Results  Component Value Date   WBC 3.7 (L) 08/10/2021   HGB 13.4 08/10/2021   HCT 40.5 08/10/2021   MCV 88.1 08/10/2021   PLT 215.0 08/10/2021   Lab Results  Component Value Date   NA 136 08/10/2021   K 4.8 08/10/2021   CO2 30 08/10/2021   GLUCOSE 85 08/10/2021   BUN 10 08/10/2021   CREATININE 0.88 08/10/2021   BILITOT 0.4 08/10/2021   ALKPHOS 55 08/10/2021   AST 13 08/10/2021   ALT 11 08/10/2021   PROT 6.9 08/10/2021   ALBUMIN 3.9  08/10/2021   CALCIUM 9.5 08/10/2021   ANIONGAP 7 06/08/2019   GFR 82.61 08/10/2021   Lab Results  Component Value Date   CHOL 221 (H) 08/10/2021   Lab Results  Component Value Date   HDL 52.20 08/10/2021   Lab Results  Component Value Date   LDLCALC 151 (H) 08/10/2021   Lab Results  Component Value Date   TRIG 88.0 08/10/2021   Lab Results  Component Value Date   CHOLHDL 4 08/10/2021   Lab Results  Component Value Date   HGBA1C 5.9 08/10/2021       Assessment & Plan:      Problem List Items Addressed This Visit   None   Sandy Love does not currently have medications on file.  No orders of the defined types were placed in this encounter.

## 2022-03-01 ENCOUNTER — Ambulatory Visit: Payer: 59 | Admitting: Family Medicine

## 2022-06-06 ENCOUNTER — Other Ambulatory Visit (HOSPITAL_COMMUNITY): Payer: Self-pay

## 2022-06-06 MED ORDER — AMOXICILLIN 500 MG PO CAPS
500.0000 mg | ORAL_CAPSULE | Freq: Two times a day (BID) | ORAL | 0 refills | Status: DC
  Filled 2022-06-06: qty 14, 7d supply, fill #0

## 2022-07-06 ENCOUNTER — Encounter: Payer: Self-pay | Admitting: Family Medicine

## 2022-07-09 ENCOUNTER — Encounter: Payer: Self-pay | Admitting: *Deleted

## 2022-07-09 NOTE — Telephone Encounter (Signed)
Called pt lvm to call us back about letter

## 2022-07-09 NOTE — Telephone Encounter (Signed)
She would like for it to be emailed to [email or web address] . Please advise.

## 2022-07-10 ENCOUNTER — Encounter: Payer: Self-pay | Admitting: *Deleted

## 2022-07-11 ENCOUNTER — Other Ambulatory Visit (HOSPITAL_COMMUNITY): Payer: Self-pay

## 2022-07-11 ENCOUNTER — Telehealth: Payer: 59 | Admitting: Physician Assistant

## 2022-07-11 DIAGNOSIS — J208 Acute bronchitis due to other specified organisms: Secondary | ICD-10-CM | POA: Diagnosis not present

## 2022-07-11 MED ORDER — BENZONATATE 100 MG PO CAPS
100.0000 mg | ORAL_CAPSULE | Freq: Three times a day (TID) | ORAL | 0 refills | Status: DC | PRN
  Filled 2022-07-11: qty 30, 10d supply, fill #0

## 2022-07-11 MED ORDER — PREDNISONE 20 MG PO TABS
40.0000 mg | ORAL_TABLET | Freq: Every day | ORAL | 0 refills | Status: DC
  Filled 2022-07-11: qty 10, 5d supply, fill #0

## 2022-07-11 NOTE — Progress Notes (Signed)
I have spent 5 minutes in review of e-visit questionnaire, review and updating patient chart, medical decision making and response to patient.   Shondell Poulson Cody Jnyah Brazee, PA-C    

## 2022-07-11 NOTE — Progress Notes (Signed)
We are sorry that you are not feeling well.  Here is how we plan to help!  Based on your presentation I believe you most likely have A cough due to a virus.  This is called viral bronchitis and is best treated by rest, plenty of fluids and control of the cough.  You may use Ibuprofen or Tylenol as directed to help your symptoms.     I have sent in a prednisone burst to take daily for 5 days to calm bronchospasm/cough and open airways. I have also sent in  A prescription cough medication called Tessalon Perles 100mg . You may take 1-2 capsules every 8 hours as needed for your cough.   From your responses in the eVisit questionnaire you describe inflammation in the upper respiratory tract which is causing a significant cough.  This is commonly called Bronchitis and has four common causes:   Allergies Viral Infections Acid Reflux Bacterial Infection Allergies, viruses and acid reflux are treated by controlling symptoms or eliminating the cause. An example might be a cough caused by taking certain blood pressure medications. You stop the cough by changing the medication. Another example might be a cough caused by acid reflux. Controlling the reflux helps control the cough.  USE OF BRONCHODILATOR ("RESCUE") INHALERS: There is a risk from using your bronchodilator too frequently.  The risk is that over-reliance on a medication which only relaxes the muscles surrounding the breathing tubes can reduce the effectiveness of medications prescribed to reduce swelling and congestion of the tubes themselves.  Although you feel brief relief from the bronchodilator inhaler, your asthma may actually be worsening with the tubes becoming more swollen and filled with mucus.  This can delay other crucial treatments, such as oral steroid medications. If you need to use a bronchodilator inhaler daily, several times per day, you should discuss this with your provider.  There are probably better treatments that could be used  to keep your asthma under control.     HOME CARE Only take medications as instructed by your medical team. Complete the entire course of an antibiotic. Drink plenty of fluids and get plenty of rest. Avoid close contacts especially the very young and the elderly Cover your mouth if you cough or cough into your sleeve. Always remember to wash your hands A steam or ultrasonic humidifier can help congestion.   GET HELP RIGHT AWAY IF: You develop worsening fever. You become short of breath You cough up blood. Your symptoms persist after you have completed your treatment plan MAKE SURE YOU  Understand these instructions. Will watch your condition. Will get help right away if you are not doing well or get worse.    Thank you for choosing an e-visit.  Your e-visit answers were reviewed by a board certified advanced clinical practitioner to complete your personal care plan. Depending upon the condition, your plan could have included both over the counter or prescription medications.  Please review your pharmacy choice. Make sure the pharmacy is open so you can pick up prescription now. If there is a problem, you may contact your provider through and have the prescription routed to another pharmacy.  Your safety is important to [employer]. If you have drug allergies check your prescription carefully.   For the next 24 hours you can use MyChart to ask questions about today's visit, request a non-urgent call back, or ask for a work or school excuse. You will get an email in the next two days asking about  your experience. I hope that your e-visit has been valuable and will speed your recovery.

## 2022-07-16 ENCOUNTER — Ambulatory Visit: Payer: 59 | Admitting: Family Medicine

## 2022-07-19 ENCOUNTER — Encounter: Payer: Self-pay | Admitting: Family Medicine

## 2022-07-19 ENCOUNTER — Other Ambulatory Visit (HOSPITAL_COMMUNITY): Payer: Self-pay

## 2022-07-19 MED ORDER — AZITHROMYCIN 250 MG PO TABS
ORAL_TABLET | ORAL | 0 refills | Status: DC
  Filled 2022-07-19: qty 6, 5d supply, fill #0

## 2022-07-19 NOTE — Addendum Note (Signed)
Addended by: Waldon Merl on: 07/19/2022 10:27 AM   Modules accepted: Orders

## 2022-07-20 ENCOUNTER — Other Ambulatory Visit: Payer: Self-pay | Admitting: Family Medicine

## 2022-07-20 ENCOUNTER — Other Ambulatory Visit (HOSPITAL_COMMUNITY): Payer: Self-pay

## 2022-07-20 MED ORDER — AZITHROMYCIN 250 MG PO TABS
ORAL_TABLET | ORAL | 0 refills | Status: AC
  Filled 2022-07-20 – 2022-07-24 (×2): qty 6, 5d supply, fill #0

## 2022-07-24 ENCOUNTER — Other Ambulatory Visit (HOSPITAL_COMMUNITY): Payer: Self-pay

## 2022-08-03 DIAGNOSIS — Z6841 Body Mass Index (BMI) 40.0 and over, adult: Secondary | ICD-10-CM | POA: Diagnosis not present

## 2022-08-03 DIAGNOSIS — Z124 Encounter for screening for malignant neoplasm of cervix: Secondary | ICD-10-CM | POA: Diagnosis not present

## 2022-08-03 DIAGNOSIS — Z01419 Encounter for gynecological examination (general) (routine) without abnormal findings: Secondary | ICD-10-CM | POA: Diagnosis not present

## 2022-08-03 DIAGNOSIS — Z1231 Encounter for screening mammogram for malignant neoplasm of breast: Secondary | ICD-10-CM | POA: Diagnosis not present

## 2022-08-03 DIAGNOSIS — Z01411 Encounter for gynecological examination (general) (routine) with abnormal findings: Secondary | ICD-10-CM | POA: Diagnosis not present

## 2022-08-03 DIAGNOSIS — Z113 Encounter for screening for infections with a predominantly sexual mode of transmission: Secondary | ICD-10-CM | POA: Diagnosis not present

## 2022-08-15 NOTE — Assessment & Plan Note (Signed)
Encouraged DASH or MIND diet, decrease po intake and increase exercise as tolerated. Needs 7-8 hours of sleep nightly. Avoid trans fats, eat small, frequent meals every 4-5 hours with lean proteins, complex carbs and healthy fats. Minimize simple carbs, high fat foods and processed foods. Consider [phone] or Bernie Covey, is going to consider after beginning of 2024

## 2022-08-15 NOTE — Assessment & Plan Note (Signed)
Encourage heart healthy diet such as MIND or DASH diet, increase exercise, avoid trans fats, simple carbohydrates and processed foods, consider a krill or fish or flaxseed oil cap daily.  °

## 2022-08-15 NOTE — Assessment & Plan Note (Signed)
Patient encouraged to maintain heart healthy diet, regular exercise, adequate sleep. Consider daily probiotics. Take medications as prescribed. Labs ordered and reviewed. She had her pap with GYN with Dr Verlee Monte, will request records.  MGM 07/2022 repeat next year.

## 2022-08-15 NOTE — Assessment & Plan Note (Signed)
hgba1c acceptable, minimize simple carbs.  

## 2022-08-16 ENCOUNTER — Ambulatory Visit (INDEPENDENT_AMBULATORY_CARE_PROVIDER_SITE_OTHER): Payer: 59 | Admitting: Family Medicine

## 2022-08-16 ENCOUNTER — Encounter: Payer: Self-pay | Admitting: Family Medicine

## 2022-08-16 VITALS — BP 122/84 | HR 88 | Temp 98.3°F | Resp 18 | Ht 67.0 in | Wt 329.6 lb

## 2022-08-16 DIAGNOSIS — R739 Hyperglycemia, unspecified: Secondary | ICD-10-CM | POA: Diagnosis not present

## 2022-08-16 DIAGNOSIS — Z0000 Encounter for general adult medical examination without abnormal findings: Secondary | ICD-10-CM

## 2022-08-16 DIAGNOSIS — E785 Hyperlipidemia, unspecified: Secondary | ICD-10-CM

## 2022-08-16 DIAGNOSIS — D72819 Decreased white blood cell count, unspecified: Secondary | ICD-10-CM | POA: Diagnosis not present

## 2022-08-16 LAB — CBC WITH DIFFERENTIAL/PLATELET
Basophils Absolute: 0 10*3/uL (ref 0.0–0.1)
Basophils Relative: 0.4 % (ref 0.0–3.0)
Eosinophils Absolute: 0.1 10*3/uL (ref 0.0–0.7)
Eosinophils Relative: 2.6 % (ref 0.0–5.0)
HCT: 38.5 % (ref 36.0–46.0)
Hemoglobin: 12.5 g/dL (ref 12.0–15.0)
Lymphocytes Relative: 43.1 % (ref 12.0–46.0)
Lymphs Abs: 1.7 10*3/uL (ref 0.7–4.0)
MCHC: 32.6 g/dL (ref 30.0–36.0)
MCV: 86.1 fl (ref 78.0–100.0)
Monocytes Absolute: 0.3 10*3/uL (ref 0.1–1.0)
Monocytes Relative: 7.5 % (ref 3.0–12.0)
Neutro Abs: 1.8 10*3/uL (ref 1.4–7.7)
Neutrophils Relative %: 46.4 % (ref 43.0–77.0)
Platelets: 251 10*3/uL (ref 150.0–400.0)
RBC: 4.46 Mil/uL (ref 3.87–5.11)
RDW: 15 % (ref 11.5–15.5)
WBC: 3.9 10*3/uL — ABNORMAL LOW (ref 4.0–10.5)

## 2022-08-16 LAB — HM PAP SMEAR: HM Pap smear: NEGATIVE

## 2022-08-16 LAB — COMPREHENSIVE METABOLIC PANEL
ALT: 12 U/L (ref 0–35)
AST: 12 U/L (ref 0–37)
Albumin: 3.8 g/dL (ref 3.5–5.2)
Alkaline Phosphatase: 45 U/L (ref 39–117)
BUN: 14 mg/dL (ref 6–23)
CO2: 28 mEq/L (ref 19–32)
Calcium: 9.1 mg/dL (ref 8.4–10.5)
Chloride: 102 mEq/L (ref 96–112)
Creatinine, Ser: 0.91 mg/dL (ref 0.40–1.20)
GFR: 78.79 mL/min (ref >60.00)
Glucose, Bld: 88 mg/dL (ref 70–99)
Potassium: 4.6 mEq/L (ref 3.5–5.1)
Sodium: 136 mEq/L (ref 135–145)
Total Bilirubin: 0.3 mg/dL (ref 0.2–1.2)
Total Protein: 6.8 g/dL (ref 6.0–8.3)

## 2022-08-16 LAB — LIPID PANEL
Cholesterol: 203 mg/dL — ABNORMAL HIGH (ref 0–200)
HDL: 51.3 mg/dL (ref >39.00)
LDL Cholesterol: 140 mg/dL — ABNORMAL HIGH (ref 0–99)
NonHDL: 151.83
Total CHOL/HDL Ratio: 4
Triglycerides: 59 mg/dL (ref 0.0–149.0)
VLDL: 11.8 mg/dL (ref 0.0–40.0)

## 2022-08-16 LAB — HEMOGLOBIN A1C: Hgb A1c MFr Bld: 6.2 % (ref 4.6–6.5)

## 2022-08-16 NOTE — Patient Instructions (Signed)
Sandy Love, Saxenda shots for weight list  Consider DASH or MIND or PURE Diets Preventive Care 40-40 Years Old, Female Preventive care refers to lifestyle choices and visits with your health care provider that can promote health and wellness. Preventive care visits are also called wellness exams. What can I expect for my preventive care visit? Counseling During your preventive care visit, your health care provider may ask about your: Medical history, including: Past medical problems. Family medical history. Pregnancy history. Current health, including: Menstrual cycle. Method of birth control. Emotional well-being. Home life and relationship well-being. Sexual activity and sexual health. Lifestyle, including: Alcohol, nicotine or tobacco, and drug use. Access to firearms. Diet, exercise, and sleep habits. Work and work Statistician. Sunscreen use. Safety issues such as seatbelt and bike helmet use. Physical exam Your health care provider may check your: Height and weight. These may be used to calculate your BMI (body mass index). BMI is a measurement that tells if you are at a healthy weight. Waist circumference. This measures the distance around your waistline. This measurement also tells if you are at a healthy weight and may help predict your risk of certain diseases, such as type 2 diabetes and high blood pressure. Heart rate and blood pressure. Body temperature. Skin for abnormal spots. What immunizations do I need?  Vaccines are usually given at various ages, according to a schedule. Your health care provider will recommend vaccines for you based on your age, medical history, and lifestyle or other factors, such as travel or where you work. What tests do I need? Screening Your health care provider may recommend screening tests for certain conditions. This may include: Pelvic exam and Pap test. Lipid and cholesterol levels. Diabetes screening. This is done by checking your  blood sugar (glucose) after you have not eaten for a while (fasting). Hepatitis B test. Hepatitis C test. HIV (human immunodeficiency virus) test. STI (sexually transmitted infection) testing, if you are at risk. BRCA-related cancer screening. This may be done if you have a family history of breast, ovarian, tubal, or peritoneal cancers. Talk with your health care provider about your test results, treatment options, and if necessary, the need for more tests. Follow these instructions at home: Eating and drinking  Eat a healthy diet that includes fresh fruits and vegetables, whole grains, lean protein, and low-fat dairy products. Take vitamin and mineral supplements as recommended by your health care provider. Do not drink alcohol if: Your health care provider tells you not to drink. You are pregnant, may be pregnant, or are planning to become pregnant. If you drink alcohol: Limit how much you have to 0-1 drink a day. Know how much alcohol is in your drink. In the [place] one drink equals one 12 oz bottle of beer (355 mL), one 5 oz glass of wine (148 mL), or one 1 oz glass of hard liquor (44 mL). Lifestyle Brush your teeth every morning and night with fluoride toothpaste. Floss one time each day. Exercise for at least 30 minutes 5 or more days each week. Do not use any products that contain nicotine or tobacco. These products include cigarettes, chewing tobacco, and vaping devices, such as e-cigarettes. If you need help quitting, ask your health care provider. Do not use drugs. If you are sexually active, practice safe sex. Use a condom or other form of protection to prevent STIs. If you do not wish to become pregnant, use a form of birth control. If you plan to become pregnant, see your health care  provider for a prepregnancy visit. Find healthy ways to manage stress, such as: Meditation, yoga, or listening to music. Journaling. Talking to a trusted person. Spending time with friends and  family. Minimize exposure to UV radiation to reduce your risk of skin cancer. Safety Always wear your seat belt while driving or riding in a vehicle. Do not drive: If you have been drinking alcohol. Do not ride with someone who has been drinking. If you have been using any mind-altering substances or drugs. While texting. When you are tired or distracted. Wear a helmet and other protective equipment during sports activities. If you have firearms in your house, make sure you follow all gun safety procedures. Seek help if you have been physically or sexually abused. What's next? Go to your health care provider once a year for an annual wellness visit. Ask your health care provider how often you should have your eyes and teeth checked. Stay up to date on all vaccines. This information is not intended to replace advice given to you by your health care provider. Make sure you discuss any questions you have with your health care provider. Document Revised: 02/01/2021 Document Reviewed: 02/01/2021 Elsevier Patient Education  [place].

## 2022-08-16 NOTE — Progress Notes (Signed)
Subjective:   By signing my name below, I, Vickey Sages, attest that this documentation has been prepared under the direction and in the presence of Bradd Canary, MD., 08/16/2022.   Patient ID: Sandy Love, female    DOB: 10/28/1980, 41 y.o.   MRN: [id number]  Chief Complaint  Patient presents with   Annual Exam    Pt states fasting    HPI Patient is in today for a comprehensive physical exam and follow up on chronic medical concerns. She denies CP/palpitations/SOB/HA/ congestion/fevers/GI or GU c/o.  Family History No changes to the family history.  Menopause Patient is now 41 yo and is inquiring if she will start menopause soon.  Weight Management Patient's weight has significantly increased since her last visit and she has considered taking weight loss injectable medications. Body mass index is 51.62 kg/m. Wt Readings from Last 3 Encounters:  08/16/22 (!) 329 lb 9.6 oz (149.5 kg)  08/10/21 291 lb 9.6 oz (132.3 kg)  05/23/21 294 lb 6.4 oz (133.5 kg)   Past Medical History:  Diagnosis Date   History of miscarriage 09/20/2014   X 3     Obesity 09/20/2014   Pedal edema 02/27/2015   Preventative health care 11/18/2014   Past Surgical History:  Procedure Laterality Date   CERVICAL CERCLAGE N/A 04/25/2019   Procedure: CERCLAGE CERVICAL;  Surgeon: Noralee Space, MD;  Location: [facility];  Service: Gynecology;  Laterality: N/A;  request extra assist for holding retractors   CERVICAL CERCLAGE N/A 06/13/2019   Procedure: CERCLAGE REMOVAL;  Surgeon: Noland Fordyce, MD;  Location: [facility];  Service: Gynecology;  Laterality: N/A;   DILATION AND CURETTAGE OF UTERUS     Family History  Problem Relation Age of Onset   Diabetes Maternal Grandmother    Diabetes Paternal Grandmother    Obesity Sister    Renal Disease Brother        single kidney   Dementia Paternal Grandfather    Obesity Brother    Cancer Maternal Aunt 77       breast    Social History   Socioeconomic  History   Marital status: Single    Spouse name: Not on file   Number of children: Not on file   Years of education: Not on file   Highest education level: Not on file  Occupational History   Not on file  Tobacco Use   Smoking status: Former    Types: Cigars, Cigarettes   Smokeless tobacco: Former    Quit date: 12/19/2018   Tobacco comments:    black and milds  Vaping Use   Vaping Use: Never used  Substance and Sexual Activity   Alcohol use: Not Currently    Comment: occ   Drug use: No   Sexual activity: Yes    Birth control/protection: None  Other Topics Concern   Not on file  Social History Narrative   Not on file   Social Determinants of Health   Financial Resource Strain: Low Risk  (04/24/2019)   Overall Financial Resource Strain (CARDIA)    Difficulty of Paying Living Expenses: Not hard at all  Food Insecurity: No Food Insecurity (04/24/2019)   Hunger Vital Sign    Worried About Running Out of Food in the Last Year: Never true    Ran Out of Food in the Last Year: Never true  Transportation Needs: No Transportation Needs (04/24/2019)   PRAPARE - Administrator, Civil Service (Medical): No  Lack of Transportation (Non-Medical): No  Physical Activity: Not on file  Stress: Not on file  Social Connections: Not on file  Intimate Partner Violence: Not on file    Outpatient Medications Prior to Visit  Medication Sig Dispense Refill   benzonatate (TESSALON) 100 MG capsule Take 1 capsule (100 mg total) by mouth 3 (three) times daily as needed for cough. (Patient not taking: Reported on 08/16/2022) 30 capsule 0   predniSONE (DELTASONE) 20 MG tablet Take 2 tablets (40 mg total) by mouth daily with breakfast. (Patient not taking: Reported on 08/16/2022) 10 tablet 0   No facility-administered medications prior to visit.    No Known Allergies  Review of Systems  Constitutional:  Negative for chills and fever.  HENT:  Negative for congestion.   Respiratory:   Negative for shortness of breath.   Cardiovascular:  Negative for chest pain and palpitations.  Gastrointestinal:  Negative for abdominal pain, blood in stool, constipation, diarrhea, nausea and vomiting.  Genitourinary:  Negative for dysuria, frequency, hematuria and urgency.  Skin:           Neurological:  Negative for headaches.       Objective:    Physical Exam Constitutional:      General: She is not in acute distress.    Appearance: Normal appearance. She is obese. She is not ill-appearing.  HENT:     Head: Normocephalic and atraumatic.     Right Ear: Tympanic membrane, ear canal and external ear normal.     Left Ear: Tympanic membrane, ear canal and external ear normal.     Nose: Nose normal.     Mouth/Throat:     Mouth: Mucous membranes are moist.     Pharynx: Oropharynx is clear.  Eyes:     General:        Right eye: No discharge.        Left eye: No discharge.     Extraocular Movements: Extraocular movements intact.     Right eye: No nystagmus.     Left eye: No nystagmus.     Pupils: Pupils are equal, round, and reactive to light.  Neck:     Vascular: No carotid bruit.  Cardiovascular:     Rate and Rhythm: Normal rate and regular rhythm.     Pulses: Normal pulses.     Heart sounds: Normal heart sounds. No murmur heard.    No gallop.  Pulmonary:     Effort: Pulmonary effort is normal. No respiratory distress.     Breath sounds: Normal breath sounds. No wheezing or rales.  Abdominal:     General: Bowel sounds are normal.     Palpations: Abdomen is soft.     Tenderness: There is no abdominal tenderness. There is no guarding.  Musculoskeletal:        General: Normal range of motion.     Cervical back: Normal range of motion.     Right lower leg: No edema.     Left lower leg: No edema.     Comments: Muscle strength 5/5 on upper and lower extremities.   Lymphadenopathy:     Cervical: No cervical adenopathy.  Skin:    General: Skin is warm and dry.   Neurological:     Mental Status: She is alert and oriented to person, place, and time.     Sensory: Sensation is intact.     Motor: Motor function is intact.     Coordination: Coordination is intact.  Deep Tendon Reflexes:     Reflex Scores:      Patellar reflexes are 2+ on the right side and 2+ on the left side. Psychiatric:        Mood and Affect: Mood normal.        Behavior: Behavior normal.        Judgment: Judgment normal.     BP 122/84 (BP Location: Left Arm, Patient Position: Sitting, Cuff Size: Large)   Pulse 88   Temp 98.3 F (36.8 C) (Oral)   Resp 18   Ht 5\' 7"  (1.702 m)   Wt (!) 329 lb 9.6 oz (149.5 kg)   LMP 07/23/2022   SpO2 98%   BMI 51.62 kg/m  Wt Readings from Last 3 Encounters:  08/16/22 (!) 329 lb 9.6 oz (149.5 kg)  08/10/21 291 lb 9.6 oz (132.3 kg)  05/23/21 294 lb 6.4 oz (133.5 kg)    Diabetic Foot Exam - Simple   No data filed    Lab Results  Component Value Date   WBC 3.9 (L) 08/16/2022   HGB 12.5 08/16/2022   HCT 38.5 08/16/2022   PLT 251.0 08/16/2022   GLUCOSE 88 08/16/2022   CHOL 203 (H) 08/16/2022   TRIG 59.0 08/16/2022   HDL 51.30 08/16/2022   LDLCALC 140 (H) 08/16/2022   ALT 12 08/16/2022   AST 12 08/16/2022   NA 136 08/16/2022   K 4.6 08/16/2022   CL 102 08/16/2022   CREATININE 0.91 08/16/2022   BUN 14 08/16/2022   CO2 28 08/16/2022   TSH 2.74 08/16/2022   HGBA1C 6.2 08/16/2022    Lab Results  Component Value Date   TSH 2.74 08/16/2022   Lab Results  Component Value Date   WBC 3.9 (L) 08/16/2022   HGB 12.5 08/16/2022   HCT 38.5 08/16/2022   MCV 86.1 08/16/2022   PLT 251.0 08/16/2022   Lab Results  Component Value Date   NA 136 08/16/2022   K 4.6 08/16/2022   CO2 28 08/16/2022   GLUCOSE 88 08/16/2022   BUN 14 08/16/2022   CREATININE 0.91 08/16/2022   BILITOT 0.3 08/16/2022   ALKPHOS 45 08/16/2022   AST 12 08/16/2022   ALT 12 08/16/2022   PROT 6.8 08/16/2022   ALBUMIN 3.8 08/16/2022   CALCIUM 9.1  08/16/2022   ANIONGAP 7 06/08/2019   GFR 78.79 08/16/2022   Lab Results  Component Value Date   CHOL 203 (H) 08/16/2022   Lab Results  Component Value Date   HDL 51.30 08/16/2022   Lab Results  Component Value Date   LDLCALC 140 (H) 08/16/2022   Lab Results  Component Value Date   TRIG 59.0 08/16/2022   Lab Results  Component Value Date   CHOLHDL 4 08/16/2022   Lab Results  Component Value Date   HGBA1C 6.2 08/16/2022      Assessment & Plan:  Mammogram: Last completed on 08/03/2022 with no mammographic evidence of malignancy. Repeat in 1-2 years.  Pap Smear: Last completed on 08/03/2022 with normal results. Repeat in 3-5 years.  Advanced Directives: Encouraged patient to complete advanced care planning documents.  Immunizations: Reviewed immunizations.  Labs: Routine blood work will be completed today.  Weight Management: Informed patient about 08/05/2022 and Wegovy as alternatives to Ozempic. Encouraged adequate sleep, exercise, heart healthy diet such as the PURE diet, and hydration. Problem List Items Addressed This Visit     Preventative health care    Patient encouraged to maintain heart healthy diet, regular  exercise, adequate sleep. Consider daily probiotics. Take medications as prescribed. Labs ordered and reviewed. She had her pap with GYN with Dr Verlee Monte, will request records.  MGM 07/2022 repeat next year.       Hyperlipidemia    Encourage heart healthy diet such as MIND or DASH diet, increase exercise, avoid trans fats, simple carbohydrates and processed foods, consider a krill or fish or flaxseed oil cap daily.       Relevant Orders   Lipid panel (Completed)   Morbid obesity (HCC)    Encouraged DASH or MIND diet, decrease po intake and increase exercise as tolerated. Needs 7-8 hours of sleep nightly. Avoid trans fats, eat small, frequent meals every 4-5 hours with lean proteins, complex carbs and healthy fats. Minimize simple carbs, high fat foods and  processed foods. Consider Reginal Lutes or Bernie Covey, is going to consider after beginning of 2024      Relevant Orders   CBC with Differential/Platelet (Completed)   Hyperglycemia - Primary    hgba1c acceptable, minimize simple carbs.      Relevant Orders   Comprehensive metabolic panel (Completed)   TSH (Completed)   HgB A1c (Completed)   Leukopenia    Improving, mild and asymptomatic       Relevant Orders   CBC with Differential/Platelet (Completed)   No orders of the defined types were placed in this encounter.  I, Danise Edge, MD, personally preformed the services described in this documentation.  All medical record entries made by the scribe were at my direction and in my presence.  I have reviewed the chart and discharge instructions (if applicable) and agree that the record reflects my personal performance and is accurate and complete. 08/16/2022  I,Mohammed Iqbal,acting as a scribe for Danise Edge, MD.,have documented all relevant documentation on the behalf of Danise Edge, MD,as directed by  Danise Edge, MD while in the presence of Danise Edge, MD.  Danise Edge, MD

## 2022-08-17 ENCOUNTER — Encounter: Payer: Self-pay | Admitting: Family Medicine

## 2022-08-17 DIAGNOSIS — D72819 Decreased white blood cell count, unspecified: Secondary | ICD-10-CM | POA: Insufficient documentation

## 2022-08-17 LAB — TSH: TSH: 2.74 u[IU]/mL (ref 0.35–5.50)

## 2022-08-17 NOTE — Assessment & Plan Note (Signed)
Improving, mild and asymptomatic

## 2022-09-24 ENCOUNTER — Ambulatory Visit
Admission: EM | Admit: 2022-09-24 | Discharge: 2022-09-24 | Disposition: A | Discharge status: Home / Self Care | Payer: 59 | Attending: Internal Medicine | Admitting: Internal Medicine

## 2022-09-24 DIAGNOSIS — R030 Elevated blood-pressure reading, without diagnosis of hypertension: Secondary | ICD-10-CM

## 2022-09-24 DIAGNOSIS — J069 Acute upper respiratory infection, unspecified: Secondary | ICD-10-CM | POA: Diagnosis not present

## 2022-09-24 MED ORDER — FLUTICASONE PROPIONATE 50 MCG/ACT NA SUSP
1.0000 | Freq: Every day | NASAL | 0 refills | Status: DC
  Filled 2022-09-24: qty 16, 60d supply, fill #0

## 2022-09-24 MED ORDER — PROMETHAZINE-DM 6.25-15 MG/5ML PO SYRP
5.0000 mL | ORAL_SOLUTION | Freq: Four times a day (QID) | ORAL | 0 refills | Status: DC | PRN
  Filled 2022-09-24: qty 118, 6d supply, fill #0

## 2022-09-24 NOTE — Discharge Instructions (Signed)
It appears that you have a viral illness that should run its course and self resolve with the help of symptomatic treatment.  I have prescribed you 2 medications to alleviate symptoms.  Please be advised that cough medication can make you drowsy.  Follow-up if symptoms persist or worsen.

## 2022-09-24 NOTE — ED Triage Notes (Signed)
Pt c/o cough, body aches, hard to turn neck,   Onset ~ yesterday

## 2022-09-24 NOTE — ED Provider Notes (Signed)
[facility]    CSN: [id number] Arrival date & time: 09/24/22  1857      History   Chief Complaint Chief Complaint  Patient presents with   Cough    HPI Sandy Love is a 42 y.o. female.   Patient presents with cough, nasal congestion generalized body aches that started yesterday.  Patient reports majority of her body aches are present in her neck but they are also generalized. Has full ROM of neck.  Her son has had similar symptoms.  Patient denies any fever, sore throat, chest pain, shortness of breath, nausea, vomiting, diarrhea, abdominal pain.  Patient does not report medications for symptoms.  Denies history of asthma or COPD.   Cough   Past Medical History:  Diagnosis Date   History of miscarriage 09/20/2014   X 3     Obesity 09/20/2014   Pedal edema 02/27/2015   Preventative health care 11/18/2014    Patient Active Problem List   Diagnosis Date Noted   Leukopenia 08/17/2022   Need for prophylactic vaccination against viral disease 02/03/2021   Hyperglycemia 02/03/2021   Educated about COVID-19 virus infection 03/24/2020   Hyperlipidemia 01/19/2020   Anemia 01/19/2020   Morbid obesity ([place]) 01/19/2020   Preventative health care 11/18/2014   History of miscarriage 09/20/2014    Past Surgical History:  Procedure Laterality Date   CERVICAL CERCLAGE N/A 04/25/2019   Procedure: CERCLAGE CERVICAL;  Surgeon: Tama High, MD;  Location: [facility]S;  Service: Gynecology;  Laterality: N/A;  request extra assist for holding retractors   CERVICAL CERCLAGE N/A 06/13/2019   Procedure: CERCLAGE REMOVAL;  Surgeon: Aloha Gell, MD;  Location: [facility];  Service: Gynecology;  Laterality: N/A;   DILATION AND CURETTAGE OF UTERUS      OB History     Gravida  4   Para  1   Term      Preterm  1   AB  3   Living  1      SAB  3   IAB      Ectopic      Multiple  0   Live Births  1            Home Medications    Prior to Admission  medications   Medication Sig Start Date End Date Taking? Authorizing Provider  fluticasone (FLONASE) 50 MCG/ACT nasal spray Place 1 spray into both nostrils daily. 09/24/22  Yes Rossana Molchan, Michele Rockers, FNP  promethazine-dextromethorphan (PROMETHAZINE-DM) 6.25-15 MG/5ML syrup Take 5 mLs by mouth every 6 (six) hours as needed for cough. 09/24/22  Yes Teodora Medici, FNP    Family History Family History  Problem Relation Age of Onset   Diabetes Maternal Grandmother    Diabetes Paternal Grandmother    Obesity Sister    Renal Disease Brother        single kidney   Dementia Paternal Grandfather    Obesity Brother    Cancer Maternal Aunt 47       breast    Social History Social History   Tobacco Use   Smoking status: Former    Types: Cigars, Cigarettes   Smokeless tobacco: Former    Quit date: 12/19/2018   Tobacco comments:    black and milds  Vaping Use   Vaping Use: Never used  Substance Use Topics   Alcohol use: Not Currently    Comment: occ   Drug use: No     Allergies   Patient  has no known allergies.   Review of Systems Review of Systems Per HPI  Physical Exam Triage Vital Signs ED Triage Vitals  Enc Vitals Group     BP 09/24/22 1935 (!) 164/88     Pulse Rate 09/24/22 1934 95     Resp 09/24/22 1934 16     Temp 09/24/22 1934 99.5 F (37.5 C)     Temp Source 09/24/22 1934 Oral     SpO2 09/24/22 1934 99 %     Weight --      Height --      Head Circumference --      Peak Flow --      Pain Score 09/24/22 1934 6     Pain Loc --      Pain Edu? --      Excl. in [place]? --    No data found.  Updated Vital Signs BP (!) 145/91   Pulse 95   Temp 99.5 F (37.5 C) (Oral)   Resp 16   SpO2 99%   Breastfeeding No   Visual Acuity Right Eye Distance:   Left Eye Distance:   Bilateral Distance:    Right Eye Near:   Left Eye Near:    Bilateral Near:     Physical Exam Constitutional:      General: She is not in acute distress.    Appearance: Normal appearance. She is  not toxic-appearing or diaphoretic.  HENT:     Head: Normocephalic and atraumatic.     Right Ear: Tympanic membrane and ear canal normal.     Left Ear: Tympanic membrane and ear canal normal.     Nose: Congestion present.     Mouth/Throat:     Mouth: Mucous membranes are moist.     Pharynx: No posterior oropharyngeal erythema.  Eyes:     Extraocular Movements: Extraocular movements intact.     Conjunctiva/sclera: Conjunctivae normal.     Pupils: Pupils are equal, round, and reactive to light.  Cardiovascular:     Rate and Rhythm: Normal rate and regular rhythm.     Pulses: Normal pulses.     Heart sounds: Normal heart sounds.  Pulmonary:     Effort: Pulmonary effort is normal. No respiratory distress.     Breath sounds: Normal breath sounds. No stridor. No wheezing, rhonchi or rales.  Abdominal:     General: Abdomen is flat. Bowel sounds are normal.     Palpations: Abdomen is soft.  Musculoskeletal:        General: Normal range of motion.     Cervical back: Normal range of motion. No rigidity or tenderness.  Skin:    General: Skin is warm and dry.  Neurological:     General: No focal deficit present.     Mental Status: She is alert and oriented to person, place, and time. Mental status is at baseline.  Psychiatric:        Mood and Affect: Mood normal.        Behavior: Behavior normal.      UC Treatments / Results  Labs (all labs ordered are listed, but only abnormal results are displayed) Labs Reviewed - No data to display  EKG   Radiology No results found.  Procedures Procedures (including critical care time)  Medications Ordered in UC Medications - No data to display  Initial Impression / Assessment and Plan / UC Course  I have reviewed the triage vital signs and the nursing notes.  Pertinent labs & imaging  results that were available during my care of the patient were reviewed by me and considered in my medical decision making (see chart for details).      Patient presents with symptoms likely from a viral upper respiratory infection. Do not suspect underlying cardiopulmonary process. Symptoms seem unlikely related to ACS, CHF or COPD exacerbations, pneumonia, pneumothorax. Patient is nontoxic appearing and not in need of emergent medical intervention.  Patient declined COVID testing.  No suspicion for meningitis as neck pain is most likely due to body aches and viral illness.  Recommended symptom control with medications and supportive care.  Patient was sent prescriptions and advised cough medication can make her drowsy.  Blood pressure recheck was slightly improved.  Patient denies history of hypertension.  Advised her to monitor blood pressure at home and follow-up if it remains elevated.  No signs of hypertensive urgency and she is asymptomatic regarding blood pressure so do not think that emergent evaluation is necessary.  Return if symptoms fail to improve in 1-2 weeks or you develop shortness of breath, chest pain, severe headache. Patient states understanding and is agreeable.  Discharged with PCP followup. Called pharmacy to change flonase quantity to 16g and they confirmed this.  Final Clinical Impressions(s) / UC Diagnoses   Final diagnoses:  Viral upper respiratory tract infection with cough  Elevated blood pressure reading     Discharge Instructions      It appears that you have a viral illness that should run its course and self resolve with the help of symptomatic treatment.  I have prescribed you 2 medications to alleviate symptoms.  Please be advised that cough medication can make you drowsy.  Follow-up if symptoms persist or worsen.    ED Prescriptions     Medication Sig Dispense Auth. Provider   fluticasone (FLONASE) 50 MCG/ACT nasal spray Place 1 spray into both nostrils daily. 18.2 mL Oswaldo Conroy E, [place]   promethazine-dextromethorphan (PROMETHAZINE-DM) 6.25-15 MG/5ML syrup Take 5 mLs by mouth every 6 (six) hours  as needed for cough. 118 mL Teodora Medici, [place]      PDMP not reviewed this encounter.   Teodora Medici, [place] 09/24/22 [address] [place] 09/25/22 [phone]

## 2022-09-25 ENCOUNTER — Other Ambulatory Visit (HOSPITAL_COMMUNITY): Payer: Self-pay

## 2022-09-25 ENCOUNTER — Ambulatory Visit: Payer: Self-pay

## 2022-09-25 ENCOUNTER — Other Ambulatory Visit: Payer: Self-pay

## 2022-09-26 ENCOUNTER — Other Ambulatory Visit (HOSPITAL_COMMUNITY): Payer: Self-pay

## 2022-12-06 ENCOUNTER — Other Ambulatory Visit: Payer: Self-pay

## 2022-12-06 ENCOUNTER — Telehealth: Payer: Self-pay | Admitting: Family Medicine

## 2022-12-06 DIAGNOSIS — Z111 Encounter for screening for respiratory tuberculosis: Secondary | ICD-10-CM

## 2022-12-06 NOTE — Telephone Encounter (Signed)
Pt called stating that she needed a TB test done for work. Pt would like to have the Blood test done.

## 2022-12-06 NOTE — Telephone Encounter (Signed)
[facility] has been order and sent pt message to  Call to make the lab appt.

## 2022-12-12 ENCOUNTER — Other Ambulatory Visit (INDEPENDENT_AMBULATORY_CARE_PROVIDER_SITE_OTHER): Payer: 59

## 2022-12-12 DIAGNOSIS — Z111 Encounter for screening for respiratory tuberculosis: Secondary | ICD-10-CM

## 2022-12-13 ENCOUNTER — Other Ambulatory Visit: Payer: 59

## 2022-12-14 LAB — QUANTIFERON-TB GOLD PLUS
Mitogen-NIL: 8.33 IU/mL
NIL: 0.03 IU/mL
QuantiFERON-TB Gold Plus: NEGATIVE
TB1-NIL: 0 IU/mL
TB2-NIL: 0 IU/mL

## 2022-12-27 ENCOUNTER — Ambulatory Visit: Payer: Commercial Managed Care - PPO | Admitting: Family Medicine

## 2023-02-28 ENCOUNTER — Encounter: Payer: Self-pay | Admitting: Family Medicine

## 2023-03-06 ENCOUNTER — Ambulatory Visit (INDEPENDENT_AMBULATORY_CARE_PROVIDER_SITE_OTHER): Payer: 59

## 2023-03-06 DIAGNOSIS — Z23 Encounter for immunization: Secondary | ICD-10-CM | POA: Diagnosis not present

## 2023-03-06 NOTE — Progress Notes (Signed)
Pt here for Hep B 1 of 2 vaccine per Dr. Abner Greenspan. Pt scheduled for a month out.

## 2023-03-20 ENCOUNTER — Encounter (INDEPENDENT_AMBULATORY_CARE_PROVIDER_SITE_OTHER): Payer: Self-pay

## 2023-04-03 ENCOUNTER — Ambulatory Visit (INDEPENDENT_AMBULATORY_CARE_PROVIDER_SITE_OTHER): Payer: 59

## 2023-04-03 DIAGNOSIS — Z23 Encounter for immunization: Secondary | ICD-10-CM

## 2023-04-03 NOTE — Progress Notes (Signed)
Pt here for Hep B #2 vaccine per Dr. Abner Greenspan.  Pt did well injection given on left deltoid.

## 2023-04-09 ENCOUNTER — Ambulatory Visit: Payer: 59

## 2023-05-15 ENCOUNTER — Ambulatory Visit (INDEPENDENT_AMBULATORY_CARE_PROVIDER_SITE_OTHER): Payer: 59

## 2023-05-15 DIAGNOSIS — Z23 Encounter for immunization: Secondary | ICD-10-CM | POA: Diagnosis not present

## 2023-05-15 NOTE — Progress Notes (Signed)
Pt here for Flu shot, Pt tolerated well  Injection given in Left Deltoid

## 2023-05-16 ENCOUNTER — Encounter: Payer: Self-pay | Admitting: Family Medicine

## 2023-07-25 ENCOUNTER — Telehealth: Payer: 59 | Admitting: Family Medicine

## 2023-07-25 DIAGNOSIS — J040 Acute laryngitis: Secondary | ICD-10-CM

## 2023-07-26 ENCOUNTER — Other Ambulatory Visit (HOSPITAL_COMMUNITY): Payer: Self-pay

## 2023-07-26 MED ORDER — PREDNISONE 20 MG PO TABS
40.0000 mg | ORAL_TABLET | Freq: Every day | ORAL | 0 refills | Status: DC
Start: 2023-07-26 — End: 2023-09-19
  Filled 2023-07-26 (×2): qty 10, 5d supply, fill #0

## 2023-07-26 NOTE — Progress Notes (Signed)
E-Visit for Sore Throat  We are sorry that you are not feeling well.  Here is how we plan to help!  Your symptoms indicate a likely viral infection (Pharyngitis).   Pharyngitis is inflammation in the back of the throat which can cause a sore throat, scratchiness and sometimes difficulty swallowing.   Pharyngitis is typically caused by a respiratory virus and will just run its course.  Please keep in mind that your symptoms could last up to 10 days.  For throat pain, we recommend over the counter oral pain relief medications such as acetaminophen or aspirin, or anti-inflammatory medications such as ibuprofen or naproxen sodium.  Topical treatments such as oral throat lozenges or sprays may be used as needed.  Avoid close contact with loved ones, especially the very young and elderly.  Remember to wash your hands thoroughly throughout the day as this is the number one way to prevent the spread of infection and wipe down door knobs and counters with disinfectant.  I will prescribe Prednisone 20mg  Take 2 tablets (40mg ) daily for the hoarse voice, laryngitis.  After careful review of your answers, I would not recommend an antibiotic for your condition.  Antibiotics should not be used to treat conditions that we suspect are caused by viruses like the virus that causes the common cold or flu. However, some people can have Strep with atypical symptoms. You may need formal testing in clinic or office to confirm if your symptoms continue or worsen.  Providers prescribe antibiotics to treat infections caused by bacteria. Antibiotics are very powerful in treating bacterial infections when they are used properly.  To maintain their effectiveness, they should be used only when necessary.  Overuse of antibiotics has resulted in the development of super bugs that are resistant to treatment!    Home Care: Only take medications as instructed by your medical team. Do not drink alcohol while taking these medications. A  steam or ultrasonic humidifier can help congestion.  You can place a towel over your head and breathe in the steam from hot water coming from a faucet. Avoid close contacts especially the very young and the elderly. Cover your mouth when you cough or sneeze. Always remember to wash your hands.  Get Help Right Away If: You develop worsening fever or throat pain. You develop a severe head ache or visual changes. Your symptoms persist after you have completed your treatment plan.  Make sure you Understand these instructions. Will watch your condition. Will get help right away if you are not doing well or get worse.   Thank you for choosing an e-visit.  Your e-visit answers were reviewed by a board certified advanced clinical practitioner to complete your personal care plan. Depending upon the condition, your plan could have included both over the counter or prescription medications.  Please review your pharmacy choice. Make sure the pharmacy is open so you can pick up prescription now. If there is a problem, you may contact your provider through [employer] and have the prescription routed to another pharmacy.  Your safety is important to [place]. If you have drug allergies check your prescription carefully.   For the next 24 hours you can use MyChart to ask questions about today's visit, request a non-urgent call back, or ask for a work or school excuse. You will get an email in the next two days asking about your experience. I hope that your e-visit has been valuable and will speed your recovery.   I have spent  5 minutes in review of e-visit questionnaire, review and updating patient chart, medical decision making and response to patient.   Margaretann Loveless, PA-C

## 2023-08-19 ENCOUNTER — Encounter: Payer: 59 | Admitting: Family Medicine

## 2023-08-19 ENCOUNTER — Encounter: Payer: 59 | Admitting: Physician Assistant

## 2023-08-22 DIAGNOSIS — Z1231 Encounter for screening mammogram for malignant neoplasm of breast: Secondary | ICD-10-CM | POA: Diagnosis not present

## 2023-08-22 LAB — HM MAMMOGRAPHY

## 2023-08-28 ENCOUNTER — Encounter: Payer: Self-pay | Admitting: Emergency Medicine

## 2023-09-04 ENCOUNTER — Telehealth: Payer: Self-pay

## 2023-09-04 NOTE — Telephone Encounter (Signed)
 Sent pt message

## 2023-09-13 DIAGNOSIS — H18623 Keratoconus, unstable, bilateral: Secondary | ICD-10-CM | POA: Diagnosis not present

## 2023-09-13 DIAGNOSIS — H5213 Myopia, bilateral: Secondary | ICD-10-CM | POA: Diagnosis not present

## 2023-09-13 DIAGNOSIS — H35413 Lattice degeneration of retina, bilateral: Secondary | ICD-10-CM | POA: Diagnosis not present

## 2023-09-13 DIAGNOSIS — H52213 Irregular astigmatism, bilateral: Secondary | ICD-10-CM | POA: Diagnosis not present

## 2023-09-19 ENCOUNTER — Telehealth: Payer: 59 | Admitting: Physician Assistant

## 2023-09-19 ENCOUNTER — Encounter: Payer: Self-pay | Admitting: Family Medicine

## 2023-09-19 ENCOUNTER — Other Ambulatory Visit (HOSPITAL_COMMUNITY): Payer: Self-pay

## 2023-09-19 DIAGNOSIS — T7840XA Allergy, unspecified, initial encounter: Secondary | ICD-10-CM

## 2023-09-19 MED ORDER — PREDNISONE 10 MG PO TABS
ORAL_TABLET | ORAL | 0 refills | Status: AC
Start: 2023-09-19 — End: 2023-10-03
  Filled 2023-09-19: qty 37, 14d supply, fill #0

## 2023-09-19 NOTE — Progress Notes (Signed)
E Visit for Rash  We are sorry that you are not feeling well. Here is how we plan to help!  Based on what you have shared with me and concern for an allergic reaction/dermatitis, I am prescribing a two week course of steroids (37 tablets of 10 mg prednisone).  Days 1-4 take 4 tablets (40 mg) daily  Days 5-8 take 3 tablets (30 mg) daily, Days 9-11 take 2 tablets (20 mg) daily, Days 12-14 take 1 tablet (10 mg) daily.    HOME CARE:  Take cool showers and avoid direct sunlight. Apply cool compress or wet dressings. Take a bath in an oatmeal bath.  Sprinkle content of one Aveeno packet under running faucet with comfortably warm water.  Bathe for 15-20 minutes, 1-2 times daily.  Pat dry with a towel. Do not rub the rash. Use hydrocortisone cream. Take an antihistamine like Benadryl for widespread rashes that itch.  The adult dose of Benadryl is 25-50 mg by mouth 4 times daily. Caution:  This type of medication may cause sleepiness.  Do not drink alcohol, drive, or operate dangerous machinery while taking antihistamines.  Do not take these medications if you have prostate enlargement.  Read package instructions thoroughly on all medications that you take.  GET HELP RIGHT AWAY IF:  Symptoms don't go away after treatment. Severe itching that persists. If you rash spreads or swells. If you rash begins to smell. If it blisters and opens or develops a yellow-brown crust. You develop a fever. You have a sore throat. You become short of breath.  MAKE SURE YOU:  Understand these instructions. Will watch your condition. Will get help right away if you are not doing well or get worse.  Thank you for choosing an e-visit.  Your e-visit answers were reviewed by a board certified advanced clinical practitioner to complete your personal care plan. Depending upon the condition, your plan could have included both over the counter or prescription medications.  Please review your pharmacy choice. Make  sure the pharmacy is open so you can pick up prescription now. If there is a problem, you may contact your provider through [employer] and have the prescription routed to another pharmacy.  Your safety is important to [place]. If you have drug allergies check your prescription carefully.   For the next 24 hours you can use MyChart to ask questions about today's visit, request a non-urgent call back, or ask for a work or school excuse. You will get an email in the next two days asking about your experience. I hope that your e-visit has been valuable and will speed your recovery.

## 2023-09-19 NOTE — Progress Notes (Signed)
I have spent 5 minutes in review of e-visit questionnaire, review and updating patient chart, medical decision making and response to patient.   Piedad Climes, PA-C

## 2023-09-19 NOTE — Telephone Encounter (Signed)
Copied from CRM [id number]. Topic: Clinical - Medical Advice >> Sep 19, 2023  9:30 AM Almira Coaster wrote: Reason for CRM: Patient is calling because she is having an allergic reaction but does not know what's causing it, rash, itchiness, swelling on both hands, tried to direct call to nurse triage but patient refused, she would like to see if Dr.Blyth can approve an overbook today.

## 2023-09-20 ENCOUNTER — Other Ambulatory Visit (HOSPITAL_COMMUNITY): Payer: Self-pay

## 2023-09-20 ENCOUNTER — Other Ambulatory Visit: Payer: Self-pay | Admitting: Family

## 2023-09-20 MED ORDER — TRIAMCINOLONE ACETONIDE 0.1 % EX CREA
1.0000 | TOPICAL_CREAM | Freq: Two times a day (BID) | CUTANEOUS | 0 refills | Status: DC
  Filled 2023-09-20: qty 30, 15d supply, fill #0

## 2023-09-22 NOTE — Assessment & Plan Note (Signed)
hgba1c acceptable, minimize simple carbs.  

## 2023-09-22 NOTE — Assessment & Plan Note (Signed)
 Encourage heart healthy diet such as MIND or DASH diet, increase exercise, avoid trans fats, simple carbohydrates and processed foods, consider a krill or fish or flaxseed oil cap daily.

## 2023-09-22 NOTE — Assessment & Plan Note (Signed)
 Encouraged DASH or MIND diet, decrease po intake and increase exercise as tolerated. Needs 7-8 hours of sleep nightly. Avoid trans fats, eat small, frequent meals every 4-5 hours with lean proteins, complex carbs and healthy fats. Minimize simple carbs, high fat foods and processed foods

## 2023-09-22 NOTE — Assessment & Plan Note (Signed)
Mild nd asymptomatic

## 2023-09-23 ENCOUNTER — Other Ambulatory Visit (HOSPITAL_COMMUNITY): Payer: Self-pay

## 2023-09-26 ENCOUNTER — Ambulatory Visit: Payer: 59 | Admitting: Family Medicine

## 2023-09-26 VITALS — BP 120/74 | HR 95 | Temp 98.7°F | Resp 20 | Ht 69.0 in | Wt 349.6 lb

## 2023-09-26 DIAGNOSIS — E785 Hyperlipidemia, unspecified: Secondary | ICD-10-CM | POA: Diagnosis not present

## 2023-09-26 DIAGNOSIS — R739 Hyperglycemia, unspecified: Secondary | ICD-10-CM

## 2023-09-26 DIAGNOSIS — E119 Type 2 diabetes mellitus without complications: Secondary | ICD-10-CM | POA: Diagnosis not present

## 2023-09-26 DIAGNOSIS — D709 Neutropenia, unspecified: Secondary | ICD-10-CM

## 2023-09-26 NOTE — Patient Instructions (Addendum)
 Claritin/Loratadine 10 mg twice daily Famotadine 10-20 mg twice daily    Hives Hives (urticaria) are itchy, red, swollen areas of skin. They can show up on any part of the body. They often fade within 24 hours (acute hives). If you get new hives after the old ones fade and the cycle goes on for many days or weeks, it is called chronic hives. Hives do not spread from person to person (are not contagious). Hives can happen when your body reacts to something you are allergic to (allergen) or to something that irritates your skin. When you are exposed to something that triggers hives, your body releases a chemical called histamine. This causes redness, itching, and swelling. Hives can show up right after you are exposed to a trigger or hours later. What are the causes? Hives may be caused by: Food allergies. Insect bites or stings. Allergies to pollen or pets. Spending time in sunlight, heat, or cold (exposure). Exercise. Stress. You can also get hives from other conditions and treatments. These include: Viruses, such as the common cold. Bacterial infections, such as urinary tract infections and strep throat. Certain medicines. Contact with latex or chemicals. Allergy shots. Blood transfusions. In some cases, the cause of hives is not known (idiopathic hives). What increases the risk? You are more likely to get hives if: You are female. You have food allergies. Hives are more common if you are allergic to citrus fruits, milk, eggs, peanuts, tree nuts, or shellfish. You are allergic to: Medicines. Latex. Insects. Animals. Pollen. What are the signs or symptoms? Common symptoms of hives include raised, itchy, red or white bumps or patches on your skin. These areas may: Become large and swollen (welts). Quickly change shape and location. This may happen more than once. Be separate hives or connect over a large area of skin. Sting or become painful. Turn white when pressed in the  center (blanch). In severe cases, your hands, feet, and face may also become swollen. This may happen if hives form deeper in your skin. How is this diagnosed? Hives may be diagnosed based on your symptoms, medical history, and a physical exam. You may have skin, pee (urine), or blood tests done. These can help find out what is causing your hives and rule out other health issues. You may also have a biopsy done. This is when a small piece of skin is removed for testing. How is this treated? Treatment for hives depends on the cause and on how severe your symptoms are. You may be told to use cool, wet cloths (cool compresses) or to take cool showers to relieve itching. Treatment may also include: Medicines to help: Relieve itching (antihistamines). Reduce swelling (corticosteroids). Treat infection (antibiotics). An injectable medicine called omalizumab. You may need this if you have chronic idiopathic hives and still have symptoms even after you are treated with antihistamines. In severe cases, you may need to use a device filled with medicine that gives an emergency shot of epinephrine (auto-injector pen) to prevent a very bad allergic reaction (anaphylactic reaction). Follow these instructions at home: Medicines Take and apply over-the-counter and prescription medicines only as told by your health care provider. If you were prescribed antibiotics, take them as told by your provider. Do not stop using the antibiotic even if you start to feel better. Skin care Apply cool compresses to the affected areas. Do not scratch or rub your skin. General instructions Do not take hot showers or baths. This can make itching worse. Do not  wear tight-fitting clothing. Use sunscreen. Wear protective clothing when you are outside. Avoid anything that causes your hives. Keep a journal to help track what causes your hives. Write down: What medicines you take. What you eat and drink. What products you use on  your skin. Keep all follow-up visits. Your provider will track how well treatment is working. Contact a health care provider if: Your symptoms do not get better with medicine. Your joints are painful or swollen. You have a fever. You have pain in your abdomen. Get help right away if: Your tongue, lips, or eyelids swell. Your chest or throat feels tight. You have trouble breathing or swallowing. These symptoms may be an emergency. Use the auto-injector pen right away. Then call 911. Do not wait to see if the symptoms will go away. Do not drive yourself to the hospital. This information is not intended to replace advice given to you by your health care provider. Make sure you discuss any questions you have with your health care provider. Document Revised: 05/03/2022 Document Reviewed: 04/24/2022 Elsevier Patient Education  2024 [employer].

## 2023-09-27 ENCOUNTER — Encounter: Payer: Self-pay | Admitting: Family Medicine

## 2023-09-27 LAB — CBC WITH DIFFERENTIAL/PLATELET
Basophils Absolute: 0.1 10*3/uL (ref 0.0–0.1)
Basophils Relative: 1.3 % (ref 0.0–3.0)
Eosinophils Absolute: 0.1 10*3/uL (ref 0.0–0.7)
Eosinophils Relative: 2.1 % (ref 0.0–5.0)
HCT: 38.4 % (ref 36.0–46.0)
Hemoglobin: 12.6 g/dL (ref 12.0–15.0)
Lymphocytes Relative: 36.2 % (ref 12.0–46.0)
Lymphs Abs: 2.1 10*3/uL (ref 0.7–4.0)
MCHC: 32.7 g/dL (ref 30.0–36.0)
MCV: 86 fL (ref 78.0–100.0)
Monocytes Absolute: 0.6 10*3/uL (ref 0.1–1.0)
Monocytes Relative: 10.3 % (ref 3.0–12.0)
Neutro Abs: 3 10*3/uL (ref 1.4–7.7)
Neutrophils Relative %: 50.1 % (ref 43.0–77.0)
Platelets: 278 10*3/uL (ref 150.0–400.0)
RBC: 4.47 Mil/uL (ref 3.87–5.11)
RDW: 15.3 % (ref 11.5–15.5)
WBC: 5.9 10*3/uL (ref 4.0–10.5)

## 2023-09-27 LAB — COMPREHENSIVE METABOLIC PANEL
ALT: 18 U/L (ref 0–35)
AST: 15 U/L (ref 0–37)
Albumin: 3.9 g/dL (ref 3.5–5.2)
Alkaline Phosphatase: 58 U/L (ref 39–117)
BUN: 10 mg/dL (ref 6–23)
CO2: 25 meq/L (ref 19–32)
Calcium: 9 mg/dL (ref 8.4–10.5)
Chloride: 101 meq/L (ref 96–112)
Creatinine, Ser: 0.88 mg/dL (ref 0.40–1.20)
GFR: 81.38 mL/min (ref >60.00)
Glucose, Bld: 85 mg/dL (ref 70–99)
Potassium: 4.7 meq/L (ref 3.5–5.1)
Sodium: 137 meq/L (ref 135–145)
Total Bilirubin: 0.4 mg/dL (ref 0.2–1.2)
Total Protein: 6.9 g/dL (ref 6.0–8.3)

## 2023-09-27 LAB — LIPID PANEL
Cholesterol: 203 mg/dL — ABNORMAL HIGH (ref 0–200)
HDL: 52 mg/dL (ref >39.00)
LDL Cholesterol: 131 mg/dL — ABNORMAL HIGH (ref 0–99)
NonHDL: 150.59
Total CHOL/HDL Ratio: 4
Triglycerides: 97 mg/dL (ref 0.0–149.0)
VLDL: 19.4 mg/dL (ref 0.0–40.0)

## 2023-09-27 LAB — HEMOGLOBIN A1C: Hgb A1c MFr Bld: 6.7 % — ABNORMAL HIGH (ref 4.6–6.5)

## 2023-09-27 LAB — TSH: TSH: 2.35 u[IU]/mL (ref 0.35–5.50)

## 2023-09-30 ENCOUNTER — Encounter: Payer: Self-pay | Admitting: Family Medicine

## 2023-09-30 NOTE — Telephone Encounter (Signed)
 Called the patient to schedule follow labs and Virtual visit appointment. No answer. LVM for patient to call the office.

## 2023-09-30 NOTE — Telephone Encounter (Signed)
 Attempted to call patient again. No answer. Left another voicemail message

## 2023-09-30 NOTE — Progress Notes (Signed)
 Subjective:    Patient ID: Sandy Love, female    DOB: 1981-03-26, 43 y.o.   MRN: [id number]  Chief Complaint  Patient presents with   Follow-up    6 month    HPI Patient is in today for follow up on chronic medical concerns. No recent febrile illness or hospitalizations. Denies CP/palp/SOB/HA/congestion/fevers/GI or GU c/o. Taking meds as prescribed. She continues to work from home and to juggle work and home responsibilities daily.  Past Medical History:  Diagnosis Date   History of miscarriage 09/20/2014   X 3     Obesity 09/20/2014   Pedal edema 02/27/2015   Preventative health care 11/18/2014    Past Surgical History:  Procedure Laterality Date   CERVICAL CERCLAGE N/A 04/25/2019   Procedure: CERCLAGE CERVICAL;  Surgeon: Arna Ranks, MD;  Location: [facility] LD ORS;  Service: Gynecology;  Laterality: N/A;  request extra assist for holding retractors   CERVICAL CERCLAGE N/A 06/13/2019   Procedure: CERCLAGE REMOVAL;  Surgeon: Kandyce Sor, MD;  Location: [facility] ORS;  Service: Gynecology;  Laterality: N/A;   DILATION AND CURETTAGE OF UTERUS      Family History  Problem Relation Age of Onset   Diabetes Maternal Grandmother    Diabetes Paternal Grandmother    Obesity Sister    Renal Disease Brother        single kidney   Dementia Paternal Grandfather    Obesity Brother    Cancer Maternal Aunt 20       breast    Social History   Socioeconomic History   Marital status: Single    Spouse name: Not on file   Number of children: Not on file   Years of education: Not on file   Highest education level: Associate degree: academic program  Occupational History   Not on file  Tobacco Use   Smoking status: Former    Types: Cigars, Cigarettes   Smokeless tobacco: Former    Quit date: 12/19/2018   Tobacco comments:    black and milds  Vaping Use   Vaping status: Never Used  Substance and Sexual Activity   Alcohol use: Not Currently    Comment: occ   Drug use: No   Sexual  activity: Yes    Birth control/protection: None  Other Topics Concern   Not on file  Social History Narrative   Not on file   Social Drivers of Health   Financial Resource Strain: Low Risk  (04/24/2019)   Overall Financial Resource Strain (CARDIA)    Difficulty of Paying Living Expenses: Not hard at all  Food Insecurity: No Food Insecurity (09/26/2023)   Hunger Vital Sign    Worried About Running Out of Food in the Last Year: Never true    Ran Out of Food in the Last Year: Never true  Transportation Needs: No Transportation Needs (09/26/2023)   PRAPARE - Administrator, Civil Service (Medical): No    Lack of Transportation (Non-Medical): No  Physical Activity: Insufficiently Active (09/26/2023)   Exercise Vital Sign    Days of Exercise per Week: 3 days    Minutes of Exercise per Session: 30 min  Stress: Stress Concern Present (09/26/2023)   [employer] of Occupational Health - Occupational Stress Questionnaire    Feeling of Stress : To some extent  Social Connections: Unknown (09/26/2023)   Social Connection and Isolation Panel [NHANES]    Frequency of Communication with Friends and Family: Twice a week  Frequency of Social Gatherings with Friends and Family: Never    Attends Religious Services: Never    Database Administrator or Organizations: No    Attends Engineer, Structural: Not on file    Marital Status: Patient declined  Intimate Partner Violence: Not on file    Outpatient Medications Prior to Visit  Medication Sig Dispense Refill   fluticasone  (FLONASE ) 50 MCG/ACT nasal spray Place 1 spray into both nostrils daily. 16 g 0   predniSONE  (DELTASONE ) 10 MG tablet Take 4 tablets by mouth daily with breakfast for 4 days, THEN take 3 tablets daily for 4 days, THEN take 2 tablets daily for 3 days, THEN take 1 tablet daily for 3 days. 37 tablet 0   triamcinolone  cream (KENALOG ) 0.1 % Apply 1 Application topically 2 (two) times daily. 30 g 0   No  facility-administered medications prior to visit.    No Known Allergies  Review of Systems  Constitutional:  Negative for fever and malaise/fatigue.  HENT:  Negative for congestion.   Eyes:  Negative for blurred vision.  Respiratory:  Negative for shortness of breath.   Cardiovascular:  Negative for chest pain, palpitations and leg swelling.  Gastrointestinal:  Negative for abdominal pain, blood in stool and nausea.  Genitourinary:  Negative for dysuria and frequency.  Musculoskeletal:  Negative for falls.  Skin:  Negative for rash.  Neurological:  Negative for dizziness, loss of consciousness and headaches.  Endo/Heme/Allergies:  Negative for environmental allergies.  Psychiatric/Behavioral:  Negative for depression. The patient is not nervous/anxious.        Objective:    Physical Exam Constitutional:      General: She is not in acute distress.    Appearance: Normal appearance. She is well-developed. She is obese. She is not ill-appearing or toxic-appearing.  HENT:     Head: Normocephalic and atraumatic.     Right Ear: External ear normal.     Left Ear: External ear normal.     Nose: Nose normal.  Eyes:     General:        Right eye: No discharge.        Left eye: No discharge.     Conjunctiva/sclera: Conjunctivae normal.  Neck:     Thyroid : No thyromegaly.  Cardiovascular:     Rate and Rhythm: Normal rate and regular rhythm.     Heart sounds: Normal heart sounds. No murmur heard. Pulmonary:     Effort: Pulmonary effort is normal. No respiratory distress.     Breath sounds: Normal breath sounds.  Abdominal:     General: Bowel sounds are normal.     Palpations: Abdomen is soft.     Tenderness: There is no abdominal tenderness. There is no guarding.  Musculoskeletal:        General: Normal range of motion.     Cervical back: Neck supple.  Lymphadenopathy:     Cervical: No cervical adenopathy.  Skin:    General: Skin is warm and dry.  Neurological:     Mental  Status: She is alert and oriented to person, place, and time.  Psychiatric:        Mood and Affect: Mood normal.        Behavior: Behavior normal.        Thought Content: Thought content normal.        Judgment: Judgment normal.     BP 120/74 (BP Location: Left Arm, Patient Position: Sitting, Cuff Size: Large)   Pulse 95  Temp 98.7 F (37.1 C) (Oral)   Resp 20   Ht 5' 9 (1.753 m)   Wt (!) 349 lb 9.6 oz (158.6 kg)   SpO2 97%   BMI 51.63 kg/m  Wt Readings from Last 3 Encounters:  09/26/23 (!) 349 lb 9.6 oz (158.6 kg)  08/16/22 (!) 329 lb 9.6 oz (149.5 kg)  08/10/21 291 lb 9.6 oz (132.3 kg)    Diabetic Foot Exam - Simple   No data filed    Lab Results  Component Value Date   WBC 5.9 09/26/2023   HGB 12.6 09/26/2023   HCT 38.4 09/26/2023   PLT 278.0 09/26/2023   GLUCOSE 85 09/26/2023   CHOL 203 (H) 09/26/2023   TRIG 97.0 09/26/2023   HDL 52.00 09/26/2023   LDLCALC 131 (H) 09/26/2023   ALT 18 09/26/2023   AST 15 09/26/2023   NA 137 09/26/2023   K 4.7 09/26/2023   CL 101 09/26/2023   CREATININE 0.88 09/26/2023   BUN 10 09/26/2023   CO2 25 09/26/2023   TSH 2.35 09/26/2023   HGBA1C 6.7 (H) 09/26/2023    Lab Results  Component Value Date   TSH 2.35 09/26/2023   Lab Results  Component Value Date   WBC 5.9 09/26/2023   HGB 12.6 09/26/2023   HCT 38.4 09/26/2023   MCV 86.0 09/26/2023   PLT 278.0 09/26/2023   Lab Results  Component Value Date   NA 137 09/26/2023   K 4.7 09/26/2023   CO2 25 09/26/2023   GLUCOSE 85 09/26/2023   BUN 10 09/26/2023   CREATININE 0.88 09/26/2023   BILITOT 0.4 09/26/2023   ALKPHOS 58 09/26/2023   AST 15 09/26/2023   ALT 18 09/26/2023   PROT 6.9 09/26/2023   ALBUMIN 3.9 09/26/2023   CALCIUM  9.0 09/26/2023   ANIONGAP 7 06/08/2019   GFR 81.38 09/26/2023   Lab Results  Component Value Date   CHOL 203 (H) 09/26/2023   Lab Results  Component Value Date   HDL 52.00 09/26/2023   Lab Results  Component Value Date    LDLCALC 131 (H) 09/26/2023   Lab Results  Component Value Date   TRIG 97.0 09/26/2023   Lab Results  Component Value Date   CHOLHDL 4 09/26/2023   Lab Results  Component Value Date   HGBA1C 6.7 (H) 09/26/2023       Assessment & Plan:  Hyperglycemia Assessment & Plan: hgba1c acceptable but climbing, minimize simple carbs and increase activity levels. Set patient up with glucometer to manage sugars and repeat labs in 3-4 months  Orders: -     Comprehensive metabolic panel -     Hemoglobin A1c -     TSH  Hyperlipidemia, unspecified hyperlipidemia type Assessment & Plan: Encourage heart healthy diet such as MIND or DASH diet, increase exercise, avoid trans fats, simple carbohydrates and processed foods, consider a krill or fish or flaxseed oil cap daily. Start Atorvastatin 10 mg daily if patient willing  Orders: -     Lipid panel -     TSH  Morbid obesity (HCC) Assessment & Plan: Encouraged DASH or MIND diet, decrease po intake and increase exercise as tolerated. Needs 7-8 hours of sleep nightly. Avoid trans fats, eat small, frequent meals every 4-5 hours with lean proteins, complex carbs and healthy fats. Minimize simple carbs, high fat foods and processed foods.    Neutropenia, unspecified type [facility]) Assessment & Plan: Mild nd asymptomatic  Orders: -     CBC with Differential/Platelet -  TSH  Diabetes mellitus without complication (HCC) Assessment & Plan: hgba1c acceptable but climbing, minimize simple carbs and increase activity levels. Set patient up with glucometer to manage sugars and repeat labs in 3-4 months     Harlene Horton, MD

## 2023-10-01 ENCOUNTER — Other Ambulatory Visit: Payer: Self-pay | Admitting: Emergency Medicine

## 2023-10-01 ENCOUNTER — Encounter: Payer: Self-pay | Admitting: Emergency Medicine

## 2023-10-01 DIAGNOSIS — R739 Hyperglycemia, unspecified: Secondary | ICD-10-CM

## 2023-10-01 DIAGNOSIS — E119 Type 2 diabetes mellitus without complications: Secondary | ICD-10-CM

## 2023-10-01 DIAGNOSIS — Z01419 Encounter for gynecological examination (general) (routine) without abnormal findings: Secondary | ICD-10-CM | POA: Diagnosis not present

## 2023-10-01 DIAGNOSIS — E785 Hyperlipidemia, unspecified: Secondary | ICD-10-CM

## 2023-10-01 DIAGNOSIS — E049 Nontoxic goiter, unspecified: Secondary | ICD-10-CM | POA: Diagnosis not present

## 2023-10-01 DIAGNOSIS — Z1331 Encounter for screening for depression: Secondary | ICD-10-CM | POA: Diagnosis not present

## 2023-10-01 NOTE — Telephone Encounter (Signed)
Called patient several times today. Called her mother's number and left a voicemail message. Will send patient a message through MyChart

## 2023-10-02 ENCOUNTER — Other Ambulatory Visit: Payer: Self-pay | Admitting: Obstetrics and Gynecology

## 2023-10-02 ENCOUNTER — Telehealth: Payer: Self-pay

## 2023-10-02 DIAGNOSIS — E049 Nontoxic goiter, unspecified: Secondary | ICD-10-CM

## 2023-10-02 NOTE — Telephone Encounter (Signed)
Copied from CRM [id number]. Topic: Clinical - Medical Advice >> Oct 02, 2023  2:15 PM Alcus Dad wrote: Reason for CRM: Patient stated that she signed a medical release form requesting her labs be released to [facility]. Please release to [facility] Taavon Richard

## 2023-10-02 NOTE — Telephone Encounter (Signed)
Labs faxed to Dr. Billy Coast

## 2023-10-17 ENCOUNTER — Ambulatory Visit: Payer: 59 | Admitting: Family Medicine

## 2023-10-18 ENCOUNTER — Ambulatory Visit
Admission: RE | Admit: 2023-10-18 | Discharge: 2023-10-18 | Disposition: A | Discharge status: Home / Self Care | Payer: 59 | Source: Ambulatory Visit | Attending: Obstetrics and Gynecology | Admitting: Obstetrics and Gynecology

## 2023-10-18 DIAGNOSIS — E049 Nontoxic goiter, unspecified: Secondary | ICD-10-CM

## 2023-10-18 DIAGNOSIS — R221 Localized swelling, mass and lump, neck: Secondary | ICD-10-CM | POA: Diagnosis not present

## 2023-10-25 ENCOUNTER — Encounter: Payer: Self-pay | Admitting: Family Medicine

## 2023-10-28 ENCOUNTER — Encounter: Payer: Self-pay | Admitting: *Deleted

## 2023-11-04 ENCOUNTER — Other Ambulatory Visit: Payer: Self-pay

## 2023-11-04 ENCOUNTER — Other Ambulatory Visit (HOSPITAL_COMMUNITY): Payer: Self-pay

## 2023-11-04 ENCOUNTER — Ambulatory Visit
Admission: EM | Admit: 2023-11-04 | Discharge: 2023-11-04 | Disposition: A | Discharge status: Home / Self Care | Attending: Internal Medicine | Admitting: Internal Medicine

## 2023-11-04 ENCOUNTER — Encounter: Payer: Self-pay | Admitting: *Deleted

## 2023-11-04 DIAGNOSIS — N898 Other specified noninflammatory disorders of vagina: Secondary | ICD-10-CM | POA: Insufficient documentation

## 2023-11-04 DIAGNOSIS — R300 Dysuria: Secondary | ICD-10-CM | POA: Insufficient documentation

## 2023-11-04 DIAGNOSIS — R350 Frequency of micturition: Secondary | ICD-10-CM | POA: Insufficient documentation

## 2023-11-04 LAB — POCT URINALYSIS DIP (MANUAL ENTRY)
Bilirubin, UA: NEGATIVE
Glucose, UA: NEGATIVE mg/dL
Ketones, POC UA: NEGATIVE mg/dL
Leukocytes, UA: NEGATIVE
Nitrite, UA: NEGATIVE
Protein Ur, POC: NEGATIVE mg/dL
Spec Grav, UA: 1.015 (ref 1.010–1.025)
Urobilinogen, UA: 0.2 U/dL
pH, UA: 7 (ref 5.0–8.0)

## 2023-11-04 MED ORDER — PHENAZOPYRIDINE HCL 200 MG PO TABS
200.0000 mg | ORAL_TABLET | Freq: Three times a day (TID) | ORAL | 0 refills | Status: DC
  Filled 2023-11-04: qty 6, 2d supply, fill #0

## 2023-11-04 NOTE — Discharge Instructions (Signed)
 Urine was normal.  Urine culture and vaginal swab are pending.  We will call with the result.  I have prescribed you medication to help with your urinary burning and frequency.  Ensure adequate fluids as well. Follow up if symptoms persist or worsen.

## 2023-11-04 NOTE — ED Provider Notes (Signed)
 [facility]    CSN: [id number] Arrival date & time: 11/04/23  1258      History   Chief Complaint Chief Complaint  Patient presents with   Dysuria    HPI Sandy Love is a 43 y.o. female.   Patient presents today with dysuria, urinary frequency, vaginal itching that started about 3 days ago.  Denies any new vaginal discharge, hematuria, fever.  Denies concern for STD or pregnancy as she reports that she has been celibate for 2 years.  States her menstrual cycle is supposed to start tomorrow.   Dysuria   Past Medical History:  Diagnosis Date   History of miscarriage 09/20/2014   X 3     Obesity 09/20/2014   Pedal edema 02/27/2015   Preventative health care 11/18/2014    Patient Active Problem List   Diagnosis Date Noted   Leukopenia 08/17/2022   Need for prophylactic vaccination against viral disease 02/03/2021   Diabetes mellitus without complication (HCC) 02/03/2021   Educated about COVID-19 virus infection 03/24/2020   Hyperlipidemia 01/19/2020   Anemia 01/19/2020   Morbid obesity (HCC) 01/19/2020   Preventative health care 11/18/2014   History of miscarriage 09/20/2014    Past Surgical History:  Procedure Laterality Date   CERVICAL CERCLAGE N/A 04/25/2019   Procedure: CERCLAGE CERVICAL;  Surgeon: Noralee Space, MD;  Location: [facility] LD ORS;  Service: Gynecology;  Laterality: N/A;  request extra assist for holding retractors   CERVICAL CERCLAGE N/A 06/13/2019   Procedure: CERCLAGE REMOVAL;  Surgeon: Noland Fordyce, MD;  Location: [facility] LD ORS;  Service: Gynecology;  Laterality: N/A;   DILATION AND CURETTAGE OF UTERUS      OB History     Gravida  4   Para  1   Term      Preterm  1   AB  3   Living  1      SAB  3   IAB      Ectopic      Multiple  0   Live Births  1            Home Medications    Prior to Admission medications   Medication Sig Start Date End Date Taking? Authorizing Provider  phenazopyridine (PYRIDIUM) 200 MG  tablet Take 1 tablet (200 mg total) by mouth 3 (three) times daily. Only use for up to 2 days. 11/04/23  Yes Marisa Hage, Rolly Salter E, FNP  fluticasone (FLONASE) 50 MCG/ACT nasal spray Place 1 spray into both nostrils daily. Patient not taking: Reported on 11/04/2023 09/24/22   Gustavus Bryant, FNP  triamcinolone cream (KENALOG) 0.1 % Apply 1 Application topically 2 (two) times daily. Patient not taking: Reported on 11/04/2023 09/20/23   Eulis Foster, FNP    Family History Family History  Problem Relation Age of Onset   Diabetes Maternal Grandmother    Diabetes Paternal Grandmother    Obesity Sister    Renal Disease Brother        single kidney   Dementia Paternal Grandfather    Obesity Brother    Cancer Maternal Aunt 11       breast    Social History Social History   Tobacco Use   Smoking status: Former    Types: Cigars, Cigarettes   Smokeless tobacco: Former    Quit date: 12/19/2018   Tobacco comments:    black and milds  Vaping Use   Vaping status: Never Used  Substance Use Topics   Alcohol use:  Not Currently    Comment: occ   Drug use: No     Allergies   Patient has no known allergies.   Review of Systems Review of Systems Per HPI  Physical Exam Triage Vital Signs ED Triage Vitals  Encounter Vitals Group     BP 11/04/23 1432 (!) 149/85     Systolic BP Percentile --      Diastolic BP Percentile --      Pulse Rate 11/04/23 1432 90     Resp 11/04/23 1432 18     Temp 11/04/23 1432 97.8 F (36.6 C)     Temp Source 11/04/23 1432 Oral     SpO2 11/04/23 1432 97 %     Weight --      Height --      Head Circumference --      Peak Flow --      Pain Score 11/04/23 1430 9     Pain Loc --      Pain Education --      Exclude from Growth Chart --    No data found.  Updated Vital Signs BP (!) 149/85 (BP Location: Right Arm)   Pulse 90   Temp 97.8 F (36.6 C) (Oral)   Resp 18   LMP 10/08/2023 (Approximate)   SpO2 97%   Visual Acuity Right Eye Distance:   Left Eye  Distance:   Bilateral Distance:    Right Eye Near:   Left Eye Near:    Bilateral Near:     Physical Exam Constitutional:      General: She is not in acute distress.    Appearance: Normal appearance. She is not toxic-appearing or diaphoretic.  HENT:     Head: Normocephalic and atraumatic.  Eyes:     Extraocular Movements: Extraocular movements intact.     Conjunctiva/sclera: Conjunctivae normal.  Pulmonary:     Effort: Pulmonary effort is normal.  Genitourinary:    Comments: Deferred with shared decision making.  Self swab performed. Neurological:     General: No focal deficit present.     Mental Status: She is alert and oriented to person, place, and time. Mental status is at baseline.  Psychiatric:        Mood and Affect: Mood normal.        Behavior: Behavior normal.        Thought Content: Thought content normal.        Judgment: Judgment normal.      UC Treatments / Results  Labs (all labs ordered are listed, but only abnormal results are displayed) Labs Reviewed  POCT URINALYSIS DIP (MANUAL ENTRY) - Abnormal; Notable for the following components:      Result Value   Blood, UA moderate (*)    All other components within normal limits  URINE CULTURE  CERVICOVAGINAL ANCILLARY ONLY    EKG   Radiology No results found.  Procedures Procedures (including critical care time)  Medications Ordered in UC Medications - No data to display  Initial Impression / Assessment and Plan / UC Course  I have reviewed the triage vital signs and the nursing notes.  Pertinent labs & imaging results that were available during my care of the patient were reviewed by me and considered in my medical decision making (see chart for details).     UA unremarkable.  Will send urine culture to confirm given patient is having UTI like symptoms.  Although, I am suspicious of vaginitis as well so will send cervicovaginal  swab.  STD testing deferred given patient is not sexually active.   Will await results prior to any additional treatment.  I will send Pyridium per patient's request for dysuria at this time.  Advised adequate fluids as well.  Patient verbalized understanding and was agreeable with plan. Final Clinical Impressions(s) / UC Diagnoses   Final diagnoses:  Dysuria  Urinary frequency  Vaginal itching     Discharge Instructions      Urine was normal.  Urine culture and vaginal swab are pending.  We will call with the result.  I have prescribed you medication to help with your urinary burning and frequency.  Ensure adequate fluids as well. Follow up if symptoms persist or worsen.    ED Prescriptions     Medication Sig Dispense Auth. Provider   phenazopyridine (PYRIDIUM) 200 MG tablet Take 1 tablet (200 mg total) by mouth 3 (three) times daily. Only use for up to 2 days. 6 tablet [place], Acie Fredrickson, [place]      PDMP not reviewed this encounter.   Gustavus Bryant, [place] 11/04/23 1501

## 2023-11-04 NOTE — ED Triage Notes (Signed)
 Pt reports some vaginal itching and dysuria x 3 days. States she is celibate and is not concerned for STI

## 2023-11-05 ENCOUNTER — Ambulatory Visit: Payer: Self-pay

## 2023-11-05 ENCOUNTER — Telehealth (HOSPITAL_COMMUNITY): Payer: Self-pay

## 2023-11-05 ENCOUNTER — Other Ambulatory Visit (HOSPITAL_COMMUNITY): Payer: Self-pay

## 2023-11-05 LAB — CERVICOVAGINAL ANCILLARY ONLY
Bacterial Vaginitis (gardnerella): POSITIVE — AB
Candida Glabrata: NEGATIVE
Candida Vaginitis: POSITIVE — AB
Comment: NEGATIVE
Comment: NEGATIVE
Comment: NEGATIVE

## 2023-11-05 LAB — URINE CULTURE: Culture: <10000 — AB

## 2023-11-05 MED ORDER — FLUCONAZOLE 150 MG PO TABS
150.0000 mg | ORAL_TABLET | Freq: Once | ORAL | 0 refills | Status: AC
Start: 2023-11-05 — End: 2023-11-07
  Filled 2023-11-05: qty 2, 2d supply, fill #0

## 2023-11-05 MED ORDER — METRONIDAZOLE 500 MG PO TABS
500.0000 mg | ORAL_TABLET | Freq: Two times a day (BID) | ORAL | 0 refills | Status: DC
  Filled 2023-11-05: qty 14, 7d supply, fill #0

## 2023-11-05 NOTE — Telephone Encounter (Signed)
 Per protocol, pt requires tx with metronidazole and Diflucan.  Rx sent to pharmacy on file.

## 2023-12-09 ENCOUNTER — Encounter: Attending: Family Medicine | Admitting: Dietician

## 2023-12-09 ENCOUNTER — Encounter: Payer: Self-pay | Admitting: Dietician

## 2023-12-09 DIAGNOSIS — E639 Nutritional deficiency, unspecified: Secondary | ICD-10-CM | POA: Insufficient documentation

## 2023-12-09 NOTE — Patient Instructions (Addendum)
 Take a break every couple of hours at work and walk for ~5-10 minutes.  Try to increase your water intake from 3 to 4 bottles a day.  Start to make your smoothies in a blender instead of juicing to increase your fiber intake. Make your smoothie with a fist sized portion of whatever fruits you like, add in soy milk (1 cup) for protein, and have as many vegetables as you want to fill the remaining space.   When choosing cheese, look for lower saturated fat options (swiss, reduced fat cheddar/mozzarella).  For a late night snack, try sugar free chocolate pudding with PB2 or Cool Whip and frozen fruit blended together.

## 2023-12-09 NOTE — Progress Notes (Signed)
 Medical Nutrition Therapy  Appointment Start time:  [phone]  Appointment End time:  0920 Employee Wellness Visit 1 of 3 EID: [id number]  Primary concerns today: Improving labs (A1c, Cholesterol)  Referral diagnosis: N/A Preferred learning style: No preference indicated Learning readiness: Ready   NUTRITION ASSESSMENT    Clinical Medical Hx: T2DM, HLD, Obesity Medications: N/A Labs: A1c - 6.7%, Cholesterol - 203, LDL - 131 Notable Signs/Symptoms: N/A   Lifestyle & Dietary Hx Pt reports goal of lowering A1c and weight loss. Pt reports having an allergic reaction in January that motivated them to make changes, states they believe it to be result of stress. Pt reports leaving their second job since, making dietary changes (juicing, clean eating/vegetables, cutting out snacks) and exercising (cardio, treadmill, weights).  Pt reports job is very sedentary, work is remote but pt has the option to go into the office, currently going into office most days. Pt reports exercising at the gym in their office. 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   Estimated daily fluid intake: 48 oz Supplements: N/A Sleep: Improved since quitting second job, now 8-9 hours Stress / self-care: High (9/10), Single mother  Current average weekly physical activity: Working out 3-5 days for minutes   24-Hr Dietary Recall First Meal: Rasin Bran Crunch, soy milk Snack:  Second Meal: Rotisserie chicken, white rice, mixed veggies. Snack:  Third Meal:  Snack:  Beverages: Water   NUTRITION DIAGNOSIS  NB-1.1 Food and nutrition-related knowledge deficit As related to diabetes, HLD.  As evidenced by A1c of 6.7%, LDL of 131, dietary history high in energy dense foods high in carbs/saturated fats.   NUTRITION INTERVENTION  Nutrition education (E-1) on the following topics:  Educate pt on factors that can elevate LDL cholesterol, including high dietary intake of saturated fats. Educate pt on identifying  sources of saturated fats, and how to make alternative food choices to lower saturated fat intake. Educate pt on the role of soluble fiber in binding to cholesterol in the GI tract an eliminating it from the body. Educate pt on dietary sources of soluble fiber.  Educate pt on the potential dietary causes of hypertriglyceridemia, including concentrated sugars and sugar sweetened beverages. Educate on the role of elevated LDL,total cholesterol, and triglycerides on cardiovascular health. Educate pt on the role of physical activity in lowering LDL and increasing HDL cholesterol. Educated patient on the pathophysiology of diabetes. This includes why our bodies need circulating blood sugar, the relationship between insulin and blood sugar, and the results of insulin resistance and/or pancreatic insufficiency on the development of diabetes. Educated patient on factors that contribute to elevation of blood sugars, such as stress, illness, injury,and food choices. Discussed the role that physical activity plays in lowering blood sugar. Educate patient on the three main macronutrients. Protein, fats, and carbohydrates. Discussed how each of these macronutrients affect blood sugar levels, especially carbohydrate, and the importance of eating a consistent amount of carbohydrate throughout the day.   Handouts Provided Include  Balanced Plate  Learning Style & Readiness for Change Teaching method utilized: Visual & Auditory  Demonstrated degree of understanding via: Teach Back  Barriers to learning/adherence to lifestyle change: Stress/Fad dieting  Goals Established by Pt Take a break every couple of hours at work and walk for ~5-10 minutes. Try to increase your water intake from 3 to 4 bottles a day. Start to make your smoothies in a blender instead of juicing to increase your fiber intake. Make your smoothie with a fist  sized portion of whatever fruits you like, add in soy milk (1 cup) for protein, and have as  many vegetables as you want to fill the remaining space.  When choosing cheese, look for lower saturated fat options (swiss, reduced fat cheddar/mozzarella). For a late night snack, try sugar free chocolate pudding with PB2 or Cool Whip and frozen fruit blended together.   MONITORING & EVALUATION Dietary intake, weekly physical activity, and late night hunger in 6 weeks.  Next Steps  Patient is to follow up for wellness visit 2 of 3.

## 2023-12-16 ENCOUNTER — Other Ambulatory Visit: Payer: Self-pay | Admitting: Family Medicine

## 2023-12-23 ENCOUNTER — Encounter: Payer: Self-pay | Admitting: Family Medicine

## 2023-12-23 NOTE — Telephone Encounter (Signed)
 Called patient and rescheduled her virtual visit to 03/17/24 and she will call back to schedule lab visit.

## 2023-12-31 ENCOUNTER — Telehealth: Admitting: Physician Assistant

## 2023-12-31 ENCOUNTER — Telehealth: Admitting: Family Medicine

## 2023-12-31 DIAGNOSIS — B3731 Acute candidiasis of vulva and vagina: Secondary | ICD-10-CM | POA: Diagnosis not present

## 2023-12-31 DIAGNOSIS — N898 Other specified noninflammatory disorders of vagina: Secondary | ICD-10-CM

## 2023-12-31 DIAGNOSIS — R3989 Other symptoms and signs involving the genitourinary system: Secondary | ICD-10-CM

## 2023-12-31 MED ORDER — CEPHALEXIN 500 MG PO CAPS: 500.0000 mg | ORAL_CAPSULE | Freq: Two times a day (BID) | ORAL | 0 refills | Status: AC

## 2023-12-31 MED ORDER — FLUCONAZOLE 150 MG PO TABS
ORAL_TABLET | ORAL | 0 refills | Status: DC
Start: 2023-12-31 — End: 2024-02-08

## 2023-12-31 NOTE — Patient Instructions (Signed)
 Durwood Gilmore, thank you for joining Hyla Maillard, PA-C for today's virtual visit.  While this provider is not your primary care provider (PCP), if your PCP is located in our provider database this encounter information will be shared with them immediately following your visit.   A [facility] MyChart account gives you access to today's visit and all your visits, tests, and labs performed at [facility] " click here if you don't have a [facility] MyChart account or go to [email or web address]  Consent: (Patient) Sandy Love provided verbal consent for this virtual visit at the beginning of the encounter.  Current Medications:  Current Outpatient Medications:    fluticasone  (FLONASE ) 50 MCG/ACT nasal spray, Place 1 spray into both nostrils daily. (Patient not taking: Reported on 12/09/2023), Disp: 16 g, Rfl: 0   metroNIDAZOLE  (FLAGYL ) 500 MG tablet, Take 1 tablet (500 mg total) by mouth 2 (two) times daily. (Patient not taking: Reported on 12/09/2023), Disp: 14 tablet, Rfl: 0   phenazopyridine  (PYRIDIUM ) 200 MG tablet, Take 1 tablet (200 mg total) by mouth 3 (three) times daily. Only use for up to 2 days. (Patient not taking: Reported on 12/09/2023), Disp: 6 tablet, Rfl: 0   triamcinolone  cream (KENALOG ) 0.1 %, Apply 1 Application topically 2 (two) times daily. (Patient not taking: Reported on 12/09/2023), Disp: 30 g, Rfl: 0   Medications ordered in this encounter:  No orders of the defined types were placed in this encounter.    *If you need refills on other medications prior to your next appointment, please contact your pharmacy*  Follow-Up: Call back or seek an in-person evaluation if the symptoms worsen or if the condition fails to improve as anticipated.  [facility] [phone]  Other Instructions Your symptoms are consistent with a bladder infection, also called acute cystitis. Please take your antibiotic (Keflex) as directed until all pills  are gone.  Stay very well hydrated.  Consider a daily probiotic (Align, Culturelle, or Activia) to help prevent stomach upset caused by the antibiotic.  Taking a probiotic daily may also help prevent recurrent UTIs.  Also consider taking AZO (Phenazopyridine ) tablets to help decrease pain with urination.    For yeast, take the Diflucan  as directed. You can use topical Monistat cream to further help with infection and itch if you would like.   Urinary Tract Infection A urinary tract infection (UTI) can occur any place along the urinary tract. The tract includes the kidneys, ureters, bladder, and urethra. A type of germ called bacteria often causes a UTI. UTIs are often helped with antibiotic medicine.  HOME CARE  If given, take antibiotics as told by your doctor. Finish them even if you start to feel better. Drink enough fluids to keep your pee (urine) clear or pale yellow. Avoid tea, drinks with caffeine, and bubbly (carbonated) drinks. Pee often. Avoid holding your pee in for a long time. Pee before and after having sex (intercourse). Wipe from front to back after you poop (bowel movement) if you are a woman. Use each tissue only once. GET HELP RIGHT AWAY IF:  You have back pain. You have lower belly (abdominal) pain. You have chills. You feel sick to your stomach (nauseous). You throw up (vomit). Your burning or discomfort with peeing does not go away. You have a fever. Your symptoms are not better in 3 days. MAKE SURE YOU:  Understand these instructions. Will watch your condition. Will get help right away if you are not doing  well or get worse. Document Released: 01/23/2008 Document Revised: 04/30/2012 Document Reviewed: 03/06/2012 [facility] Patient Information 2015 [place], [place]. This information is not intended to replace advice given to you by your health care provider. Make sure you discuss any questions you have with your health care provider.    If you have been instructed to  have an in-person evaluation today at a local Urgent Care facility, please use the link below. It will take you to a list of all of our available [facility], including address, phone number and hours of operation. Please do not delay care.  [facility]  If you or a family member do not have a primary care provider, use the link below to schedule a visit and establish care. When you choose a [facility] primary care physician or advanced practice provider, you gain a long-term partner in health. Find a Primary Care Provider  Learn more about [facility]'s in-office and virtual care options: [facility] - Get Care Now

## 2023-12-31 NOTE — Progress Notes (Signed)
 Virtual Visit Consent   Sandy Love, you are scheduled for a virtual visit with a [facility] provider today. Just as with appointments in the office, your consent must be obtained to participate. Your consent will be active for this visit and any virtual visit you may have with one of our providers in the next 365 days. If you have a MyChart account, a copy of this consent can be sent to you electronically.  As this is a virtual visit, video technology does not allow for your provider to perform a traditional examination. This may limit your provider's ability to fully assess your condition. If your provider identifies any concerns that need to be evaluated in person or the need to arrange testing (such as labs, EKG, etc.), we will make arrangements to do so. Although advances in technology are sophisticated, we cannot ensure that it will always work on either your end or our end. If the connection with a video visit is poor, the visit may have to be switched to a telephone visit. With either a video or telephone visit, we are not always able to ensure that we have a secure connection.  By engaging in this virtual visit, you consent to the provision of healthcare and authorize for your insurance to be billed (if applicable) for the services provided during this visit. Depending on your insurance coverage, you may receive a charge related to this service.  I need to obtain your verbal consent now. Are you willing to proceed with your visit today? [name] has provided verbal consent on 12/31/2023 for a virtual visit (video or telephone). Sandy Love, [place]  Date: 12/31/2023 12:27 PM   Virtual Visit via Video Note   I, Sandy Love, connected with  HARMONE PORCELLI  ([id number], Mar 26, 1981) on 12/31/23 at 12:15 PM EDT by a video-enabled telemedicine application and verified that I am speaking with the correct person using two identifiers.  Location: Patient: Virtual Visit Location  Patient: Home Provider: Virtual Visit Location Provider: Home Office   I discussed the limitations of evaluation and management by telemedicine and the availability of in person appointments. The patient expressed understanding and agreed to proceed.    History of Present Illness: Sandy Love is a 43 y.o. who identifies as a female who was assigned female at birth, and is being seen today for concern of UTI/vaginitis. Notes she used a new soap recently and shortly thereafter began to notice vaginal itching without discharge. Now also with 2-3 days of dysuria, urgency, frequency and suprapubic pain. Denies odor. Is not sexually active (celibate for 3 years) so denies concerns for pregnancy or STI.   HPI: HPI  Problems:  Patient Active Problem List   Diagnosis Date Noted   Leukopenia 08/17/2022   Need for prophylactic vaccination against viral disease 02/03/2021   Diabetes mellitus without complication (HCC) 02/03/2021   Educated about COVID-19 virus infection 03/24/2020   Hyperlipidemia 01/19/2020   Anemia 01/19/2020   Morbid obesity (HCC) 01/19/2020   Preventative health care 11/18/2014   History of miscarriage 09/20/2014    Allergies: No Known Allergies Medications:  Current Outpatient Medications:    cephALEXin (KEFLEX) 500 MG capsule, Take 1 capsule (500 mg total) by mouth 2 (two) times daily for 7 days., Disp: 14 capsule, Rfl: 0   fluconazole  (DIFLUCAN ) 150 MG tablet, Take 1 tablet PO once. Repeat in 3 days if needed., Disp: 2 tablet, Rfl: 0  Observations/Objective: Patient is well-developed, well-nourished in no  acute distress.  Resting comfortably at home.  Head is normocephalic, atraumatic.  No labored breathing. Speech is clear and coherent with logical content.  Patient is alert and oriented at baseline.   Assessment and Plan: 1. Suspected UTI (Primary) - cephALEXin (KEFLEX) 500 MG capsule; Take 1 capsule (500 mg total) by mouth 2 (two) times daily for 7 days.   Dispense: 14 capsule; Refill: 0  2. Yeast vaginitis - fluconazole  (DIFLUCAN ) 150 MG tablet; Take 1 tablet PO once. Repeat in 3 days if needed.  Dispense: 2 tablet; Refill: 0  Classic UTI symptoms with absence of alarm signs or symptoms. Prior history of UTI. Will treat empirically with Keflex for suspected uncomplicated cystitis. Also concern for yeast vaginitis. Will give 2-dose course of Diflucan  150 mg. Supportive measures and OTC medications reviewed. Strict in-person evaluation precautions discussed.    Follow Up Instructions: I discussed the assessment and treatment plan with the patient. The patient was provided an opportunity to ask questions and all were answered. The patient agreed with the plan and demonstrated an understanding of the instructions.  A copy of instructions were sent to the patient via MyChart unless otherwise noted below.   The patient was advised to call back or seek an in-person evaluation if the symptoms worsen or if the condition fails to improve as anticipated.    Sandy Maillard, PA-C

## 2023-12-31 NOTE — Progress Notes (Signed)
 Brynlyn,  We can only treat one condition on EV, if you feel you have both a UTI and yeast infection, we recommend a video visit so we can address both at once.    Thank you for the details you included in the comment boxes. Those details are very helpful in determining the best course of treatment for you and help us  to provide the best care. We recommend that you schedule a Virtual Urgent Care video visit in order for the provider to better assess what is going on.  The provider will be able to give you a more accurate diagnosis and treatment plan if we can more freely discuss your symptoms and with the addition of a virtual examination.

## 2024-01-02 ENCOUNTER — Other Ambulatory Visit: Payer: 59

## 2024-01-07 ENCOUNTER — Ambulatory Visit: Payer: 59 | Admitting: Family Medicine

## 2024-01-20 ENCOUNTER — Encounter: Attending: Family Medicine | Admitting: Dietician

## 2024-01-20 ENCOUNTER — Encounter: Payer: Self-pay | Admitting: Dietician

## 2024-01-20 DIAGNOSIS — E639 Nutritional deficiency, unspecified: Secondary | ICD-10-CM | POA: Insufficient documentation

## 2024-01-20 NOTE — Patient Instructions (Addendum)
 Switch from plain water to 8 oz. soy milk when making your smoothies!  Keep up the great work with your exercise!! Try to have your smoothie within an hour of finishing your exercise!  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

## 2024-01-20 NOTE — Progress Notes (Signed)
 Medical Nutrition Therapy  Appointment Start time:  [phone]  Appointment End time:  1015 Employee Wellness Visit 2 of 3 EID: [id number] Visit was conducted on Teacher, English as a foreign language location: Office Patient location: At home   Primary concerns today: Improving labs (A1c, Cholesterol)  Referral diagnosis: N/A Preferred learning style: No preference indicated Learning readiness: Change in progress   NUTRITION ASSESSMENT   Clinical Medical Hx: T2DM, HLD, Obesity Medications: N/A Labs: A1c - 6.7%, Cholesterol - 203, LDL - 131 Notable Signs/Symptoms: N/A   Lifestyle & Dietary Hx Pt reports going to the gym 5 days a week (Mon-Fri) in the morning, doing cardio and light resistance exercise, walks for ~1 hour on Saturday on Sunday, feeling really good overall and much less stress. Pt reports upcoming labs in July w/ PCP, looking forward to this to see improvements. Pt reports switching from juicing to blending their fruits and veggies into smoothies. Pt reports avoiding ground beef, makes them feel heavy. Pt reports a desire to incorporate more meals in place of some of their smoothies moving forward. 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   Estimated daily fluid intake: 48 oz Supplements: N/A Sleep: Improved since quitting second job, now 8-9 hours Stress / self-care: Lower!! Current average weekly physical activity: Working out 5 days for minutes   24-Hr Dietary Recall First Meal: Rasin Bran Crunch w/ soy milk Snack:  Second Meal: Rotisserie chicken, brown rice and beans. Snack:  Third Meal: Spinach, pineapple, apple, banana smoothie Snack: Tortilla chips w/ melted cheddar cheese Beverages: Water, smoothies   NUTRITION DIAGNOSIS  NB-1.1 Food and nutrition-related knowledge deficit As related to diabetes, HLD.  As evidenced by A1c of 6.7%, LDL of 131,  dietary history high in energy dense foods high in carbs/saturated fats.   NUTRITION INTERVENTION  Nutrition education (E-1) on the following topics:  Educate pt on factors that can elevate LDL cholesterol, including high dietary intake of saturated fats. Educate pt on identifying sources of saturated fats, and how to make alternative food choices to lower saturated fat intake. Educate pt on the role of soluble fiber in binding to cholesterol in the GI tract an eliminating it from the body. Educate pt on dietary sources of soluble fiber.  Educate pt on the potential dietary causes of hypertriglyceridemia, including concentrated sugars and sugar sweetened beverages. Educate on the role of elevated LDL,total cholesterol, and triglycerides on cardiovascular health. Educate pt on the role of physical activity in lowering LDL and increasing HDL cholesterol. Educated patient on the pathophysiology of diabetes. This includes why our bodies need circulating blood sugar, the relationship between insulin and blood sugar, and the results of insulin resistance and/or pancreatic insufficiency on the development of diabetes. Educated patient on factors that contribute to elevation of blood sugars, such as stress, illness, injury,and food choices. Discussed the role that physical activity plays in lowering blood sugar. Educate patient on the three main macronutrients. Protein, fats, and carbohydrates. Discussed how each of these macronutrients affect blood sugar levels, especially carbohydrate, and the importance of eating a consistent amount of carbohydrate throughout the day.   Handouts Provided Include  Balanced Plate  Learning Style & Readiness for Change Teaching method utilized: Visual & Auditory  Demonstrated degree of understanding via: Teach Back  Barriers to learning/adherence to lifestyle change: None  Goals Established by Pt Switch from plain water to 8 oz. soy  milk when making your smoothies! Keep  up the great work with your exercise!! Try to have your smoothie within an hour of finishing your exercise! 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   MONITORING & EVALUATION Dietary intake, weekly physical activity, and work/life balance in 2 months  Next Steps  Patient is to follow up for wellness visit 3 of 3.

## 2024-01-29 ENCOUNTER — Encounter: Payer: Self-pay | Admitting: Family Medicine

## 2024-01-31 NOTE — Telephone Encounter (Signed)
 Called patient and she was advised.

## 2024-02-08 ENCOUNTER — Telehealth: Admitting: Family Medicine

## 2024-02-08 DIAGNOSIS — B3731 Acute candidiasis of vulva and vagina: Secondary | ICD-10-CM

## 2024-02-08 DIAGNOSIS — R3989 Other symptoms and signs involving the genitourinary system: Secondary | ICD-10-CM

## 2024-02-08 MED ORDER — FLUCONAZOLE 150 MG PO TABS: ORAL_TABLET | ORAL | 0 refills | Status: DC

## 2024-02-08 MED ORDER — CEPHALEXIN 500 MG PO CAPS: 500.0000 mg | ORAL_CAPSULE | Freq: Two times a day (BID) | ORAL | 0 refills | Status: AC

## 2024-02-08 NOTE — Patient Instructions (Signed)

## 2024-02-08 NOTE — Progress Notes (Signed)
 Virtual Visit Consent   Sandy Love, you are scheduled for a virtual visit with a [facility] provider today. Just as with appointments in the office, your consent must be obtained to participate. Your consent will be active for this visit and any virtual visit you may have with one of our providers in the next 365 days. If you have a MyChart account, a copy of this consent can be sent to you electronically.  As this is a virtual visit, video technology does not allow for your provider to perform a traditional examination. This may limit your provider's ability to fully assess your condition. If your provider identifies any concerns that need to be evaluated in person or the need to arrange testing (such as labs, EKG, etc.), we will make arrangements to do so. Although advances in technology are sophisticated, we cannot ensure that it will always work on either your end or our end. If the connection with a video visit is poor, the visit may have to be switched to a telephone visit. With either a video or telephone visit, we are not always able to ensure that we have a secure connection.  By engaging in this virtual visit, you consent to the provision of healthcare and authorize for your insurance to be billed (if applicable) for the services provided during this visit. Depending on your insurance coverage, you may receive a charge related to this service.  I need to obtain your verbal consent now. Are you willing to proceed with your visit today? Sandy Love has provided verbal consent on 02/08/2024 for a virtual visit (video or telephone). Loa Lamp, FNP  Date: 02/08/2024 11:21 AM   Virtual Visit via Video Note   I, Loa Lamp, connected with  Sandy Love  ([id number], 13-Oct-1980) on 02/08/24 at 11:15 AM EDT by a video-enabled telemedicine application and verified that I am speaking with the correct person using two identifiers.  Location: Patient: Virtual Visit Location Patient:  Home Provider: Virtual Visit Location Provider: Home Office   I discussed the limitations of evaluation and management by telemedicine and the availability of in person appointments. The patient expressed understanding and agreed to proceed.    History of Present Illness: Sandy Love is a 43 y.o. who identifies as a female who was assigned female at birth, and is being seen today for vaginal itching, burning and frequency on urination. No fever. She says this started when she wore underwear for a wedding and she normall doesn't wear underwear.   HPI: HPI  Problems:  Patient Active Problem List   Diagnosis Date Noted   Leukopenia 08/17/2022   Need for prophylactic vaccination against viral disease 02/03/2021   Diabetes mellitus without complication (HCC) 02/03/2021   Educated about COVID-19 virus infection 03/24/2020   Hyperlipidemia 01/19/2020   Anemia 01/19/2020   Morbid obesity (HCC) 01/19/2020   Preventative health care 11/18/2014   History of miscarriage 09/20/2014    Allergies: No Known Allergies Medications:  Current Outpatient Medications:    cephALEXin  (KEFLEX ) 500 MG capsule, Take 1 capsule (500 mg total) by mouth 2 (two) times daily for 7 days., Disp: 14 capsule, Rfl: 0   fluconazole  (DIFLUCAN ) 150 MG tablet, Take 1 tablet PO once. Repeat in 3 days if needed., Disp: 2 tablet, Rfl: 0  Observations/Objective: Patient is well-developed, well-nourished in no acute distress.  Resting comfortably  at home.  Head is normocephalic, atraumatic.  No labored breathing.  Speech is clear and coherent with logical  content.  Patient is alert and oriented at baseline.    Assessment and Plan: 1. Suspected UTI (Primary)  2. Candidiasis of vagina  3. Yeast vaginitis - fluconazole  (DIFLUCAN ) 150 MG tablet; Take 1 tablet PO once. Repeat in 3 days if needed.  Dispense: 2 tablet; Refill: 0  Keep area clean and dry, UC as needed.   Follow Up Instructions: I discussed the  assessment and treatment plan with the patient. The patient was provided an opportunity to ask questions and all were answered. The patient agreed with the plan and demonstrated an understanding of the instructions.  A copy of instructions were sent to the patient via MyChart unless otherwise noted below.     The patient was advised to call back or seek an in-person evaluation if the symptoms worsen or if the condition fails to improve as anticipated.    Lester Platas, FNP

## 2024-03-17 ENCOUNTER — Telehealth: Admitting: Family Medicine

## 2024-03-17 ENCOUNTER — Encounter: Payer: 59 | Admitting: Family Medicine

## 2024-03-17 ENCOUNTER — Telehealth: Admitting: Physician Assistant

## 2024-03-17 DIAGNOSIS — R3989 Other symptoms and signs involving the genitourinary system: Secondary | ICD-10-CM

## 2024-03-17 DIAGNOSIS — B3731 Acute candidiasis of vulva and vagina: Secondary | ICD-10-CM | POA: Diagnosis not present

## 2024-03-17 MED ORDER — FLUCONAZOLE 150 MG PO TABS: ORAL_TABLET | ORAL | 0 refills | Status: DC

## 2024-03-17 MED ORDER — CEPHALEXIN 500 MG PO CAPS: 500.0000 mg | ORAL_CAPSULE | Freq: Two times a day (BID) | ORAL | 0 refills | Status: AC

## 2024-03-17 NOTE — Progress Notes (Signed)
 E-Visit for Urinary Problems  We are sorry that you are not feeling well.  Here is how we plan to help!  Based on what you shared with me it looks like you most likely have a simple urinary tract infection.  A UTI (Urinary Tract Infection) is a bacterial infection of the bladder.  Most cases of urinary tract infections are simple to treat but a key part of your care is to encourage you to drink plenty of fluids and watch your symptoms carefully.  I have prescribed Keflex  500 mg twice a day for 7 days.  Your symptoms should gradually improve. Call us  if the burning in your urine worsens, you develop worsening fever, back pain or pelvic pain or if your symptoms do not resolve after completing the antibiotic.  I have also sent in a 2-dose course of Diflucan . One to take now, and the other at end of antibiotic course.  Urinary tract infections can be prevented by drinking plenty of water to keep your body hydrated.  Also be sure when you wipe, wipe from front to back and don't hold it in!  If possible, empty your bladder every 4 hours.  HOME CARE Drink plenty of fluids Compete the full course of the antibiotics even if the symptoms resolve Remember, when you need to go.go. Holding in your urine can increase the likelihood of getting a UTI! GET HELP RIGHT AWAY IF: You cannot urinate You get a high fever Worsening back pain occurs You see blood in your urine You feel sick to your stomach or throw up You feel like you are going to pass out  MAKE SURE YOU  Understand these instructions. Will watch your condition. Will get help right away if you are not doing well or get worse.   Thank you for choosing an e-visit.  Your e-visit answers were reviewed by a board certified advanced clinical practitioner to complete your personal care plan. Depending upon the condition, your plan could have included both over the counter or prescription medications.  Please review your pharmacy choice. Make  sure the pharmacy is open so you can pick up prescription now. If there is a problem, you may contact your provider through [employer] and have the prescription routed to another pharmacy.  Your safety is important to us . If you have drug allergies check your prescription carefully.   For the next 24 hours you can use MyChart to ask questions about today's visit, request a non-urgent call back, or ask for a work or school excuse. You will get an email in the next two days asking about your experience. I hope that your e-visit has been valuable and will speed your recovery.

## 2024-03-17 NOTE — Progress Notes (Signed)
 Message sent to patient requesting further input regarding current symptoms. Awaiting patient response.

## 2024-03-17 NOTE — Progress Notes (Signed)
 I have spent 5 minutes in review of e-visit questionnaire, review and updating patient chart, medical decision making and response to patient.   Piedad Climes, PA-C

## 2024-03-19 ENCOUNTER — Encounter: Payer: Self-pay | Admitting: Family Medicine

## 2024-03-23 ENCOUNTER — Other Ambulatory Visit: Payer: Self-pay | Admitting: *Deleted

## 2024-03-23 DIAGNOSIS — Z111 Encounter for screening for respiratory tuberculosis: Secondary | ICD-10-CM

## 2024-03-25 ENCOUNTER — Other Ambulatory Visit: Payer: Self-pay

## 2024-03-25 NOTE — Telephone Encounter (Signed)
 Patient would like for the nurse to call her.

## 2024-03-25 NOTE — Telephone Encounter (Signed)
 Patient was called and scheduled for nurse visit  and labs on tomorrow,03/26/24.

## 2024-03-25 NOTE — Progress Notes (Signed)
 TB gold was already ordered for patient to have completed.

## 2024-03-26 ENCOUNTER — Other Ambulatory Visit

## 2024-03-26 ENCOUNTER — Ambulatory Visit

## 2024-03-30 ENCOUNTER — Ambulatory Visit: Admitting: Dietician

## 2024-04-09 ENCOUNTER — Encounter: Payer: 59 | Admitting: Family Medicine

## 2024-04-10 ENCOUNTER — Telehealth: Admitting: Physician Assistant

## 2024-04-10 DIAGNOSIS — N3090 Cystitis, unspecified without hematuria: Secondary | ICD-10-CM | POA: Diagnosis not present

## 2024-04-10 DIAGNOSIS — B3731 Acute candidiasis of vulva and vagina: Secondary | ICD-10-CM

## 2024-04-10 MED ORDER — CEPHALEXIN 500 MG PO CAPS: 500.0000 mg | ORAL_CAPSULE | Freq: Two times a day (BID) | ORAL | 0 refills | Status: AC

## 2024-04-10 MED ORDER — FLUCONAZOLE 150 MG PO TABS: 150.0000 mg | ORAL_TABLET | Freq: Once | ORAL | 0 refills | Status: AC

## 2024-04-10 NOTE — Progress Notes (Signed)
 E-Visit for Urinary Problems  We are sorry that you are not feeling well.  Here is how we plan to help!  Based on what you shared with me it looks like you most likely have a simple urinary tract infection.  A UTI (Urinary Tract Infection) is a bacterial infection of the bladder.  Most cases of urinary tract infections are simple to treat but a key part of your care is to encourage you to drink plenty of fluids and watch your symptoms carefully.  I have prescribed Keflex  500 mg twice a day for 7 days. I have also prescribed a dose of fluconazole  for a yeast infection.  Your symptoms should gradually improve. Call us  if the burning in your urine worsens, you develop worsening fever, back pain or pelvic pain or if your symptoms do not resolve after completing the antibiotic.  Urinary tract infections can be prevented by drinking plenty of water to keep your body hydrated.  Also be sure when you wipe, wipe from front to back and don't hold it in!  If possible, empty your bladder every 4 hours.  HOME CARE Drink plenty of fluids Compete the full course of the antibiotics even if the symptoms resolve Remember, when you need to go.go. Holding in your urine can increase the likelihood of getting a UTI! GET HELP RIGHT AWAY IF: You cannot urinate You get a high fever Worsening back pain occurs You see blood in your urine You feel sick to your stomach or throw up You feel like you are going to pass out  MAKE SURE YOU  Understand these instructions. Will watch your condition. Will get help right away if you are not doing well or get worse.   Thank you for choosing an e-visit.  Your e-visit answers were reviewed by a board certified advanced clinical practitioner to complete your personal care plan. Depending upon the condition, your plan could have included both over the counter or prescription medications.  Please review your pharmacy choice. Make sure the pharmacy is open so you can pick up  prescription now. If there is a problem, you may contact your provider through [employer] and have the prescription routed to another pharmacy.  Your safety is important to us . If you have drug allergies check your prescription carefully.   For the next 24 hours you can use MyChart to ask questions about today's visit, request a non-urgent call back, or ask for a work or school excuse. You will get an email in the next two days asking about your experience. I hope that your e-visit has been valuable and will speed your recovery.  Approximately 5 minutes was spent documenting and reviewing patient's chart.

## 2024-04-11 MED ORDER — FLUCONAZOLE 150 MG PO TABS: 150.0000 mg | ORAL_TABLET | Freq: Every day | ORAL | 0 refills | Status: AC

## 2024-04-11 NOTE — Addendum Note (Signed)
 Addended by: ALMEDA DEGREE on: 04/11/2024 12:06 PM   Modules accepted: Orders

## 2024-04-15 ENCOUNTER — Telehealth: Admitting: Physician Assistant

## 2024-04-15 DIAGNOSIS — J069 Acute upper respiratory infection, unspecified: Secondary | ICD-10-CM

## 2024-04-15 MED ORDER — IPRATROPIUM BROMIDE 0.03 % NA SOLN: 2.0000 | Freq: Two times a day (BID) | NASAL | 0 refills | Status: AC

## 2024-04-15 NOTE — Progress Notes (Signed)
 I have spent 5 minutes in review of e-visit questionnaire, review and updating patient chart, medical decision making and response to patient.   Elsie Velma Lunger, PA-C

## 2024-04-15 NOTE — Progress Notes (Signed)

## 2024-04-21 ENCOUNTER — Encounter: Admitting: Student

## 2024-04-24 ENCOUNTER — Other Ambulatory Visit (INDEPENDENT_AMBULATORY_CARE_PROVIDER_SITE_OTHER)

## 2024-04-24 ENCOUNTER — Encounter: Payer: Self-pay | Admitting: Family Medicine

## 2024-04-24 ENCOUNTER — Ambulatory Visit: Payer: Self-pay | Admitting: Family Medicine

## 2024-04-24 ENCOUNTER — Ambulatory Visit (INDEPENDENT_AMBULATORY_CARE_PROVIDER_SITE_OTHER)

## 2024-04-24 DIAGNOSIS — Z23 Encounter for immunization: Secondary | ICD-10-CM | POA: Diagnosis not present

## 2024-04-24 DIAGNOSIS — Z111 Encounter for screening for respiratory tuberculosis: Secondary | ICD-10-CM | POA: Diagnosis not present

## 2024-04-24 DIAGNOSIS — E785 Hyperlipidemia, unspecified: Secondary | ICD-10-CM

## 2024-04-24 DIAGNOSIS — R739 Hyperglycemia, unspecified: Secondary | ICD-10-CM

## 2024-04-24 DIAGNOSIS — E119 Type 2 diabetes mellitus without complications: Secondary | ICD-10-CM | POA: Diagnosis not present

## 2024-04-24 LAB — LIPID PANEL
Cholesterol: 211 mg/dL — ABNORMAL HIGH (ref 0–200)
HDL: 47.8 mg/dL (ref >39.00)
LDL Cholesterol: 151 mg/dL — ABNORMAL HIGH (ref 0–99)
NonHDL: 163.31
Total CHOL/HDL Ratio: 4
Triglycerides: 64 mg/dL (ref 0.0–149.0)
VLDL: 12.8 mg/dL (ref 0.0–40.0)

## 2024-04-24 LAB — HEMOGLOBIN A1C: Hgb A1c MFr Bld: 6.4 % (ref 4.6–6.5)

## 2024-04-24 NOTE — Progress Notes (Signed)
 Pt here today for Tdap and Flu vaccine,  Tdap injected into L deltoid.  Fluzone injected into R deltoid.   Pt tolerated both injections well. Immunization report/record given to Pt.

## 2024-04-28 ENCOUNTER — Encounter: Payer: Self-pay | Admitting: Family Medicine

## 2024-04-29 ENCOUNTER — Encounter: Admitting: Student

## 2024-04-29 LAB — QUANTIFERON-TB GOLD PLUS
Mitogen-NIL: >10 [IU]/mL
NIL: 0.02 [IU]/mL
QuantiFERON-TB Gold Plus: NEGATIVE
TB1-NIL: 0.01 [IU]/mL
TB2-NIL: 0.01 [IU]/mL

## 2024-04-30 NOTE — Progress Notes (Signed)
 Patient reviewed via MyChart.

## 2024-05-21 ENCOUNTER — Institutional Professional Consult (permissible substitution): Admitting: Plastic Surgery

## 2024-06-15 ENCOUNTER — Encounter: Attending: Family Medicine | Admitting: Dietician

## 2024-06-15 ENCOUNTER — Encounter: Payer: Self-pay | Admitting: Dietician

## 2024-06-15 DIAGNOSIS — E639 Nutritional deficiency, unspecified: Secondary | ICD-10-CM | POA: Insufficient documentation

## 2024-06-15 NOTE — Progress Notes (Signed)
 Medical Nutrition Therapy  Appointment Start time:  0800  Appointment End time:  0835 Employee Wellness Visit 3 of 3 EID: [id number] Visit was conducted on Teacher, English As A Foreign Language location: Office Patient location: Home   Primary concerns today: Improving labs (A1c, Cholesterol)  Referral diagnosis: N/A Preferred learning style: No preference indicated Learning readiness: Change in progress   NUTRITION ASSESSMENT   Clinical Medical Hx: T2DM, HLD, Obesity Medications: N/A Labs (04/24/2024): A1c - 6.4%, Cholesterol - 211, LDL - 151 Notable Signs/Symptoms: N/A   Lifestyle & Dietary Hx Pt reports getting labs done in September, A1c has improved but cholesterol is elevated, states they are confused as to why. Pt reports mainly juicing fruits, adding in soy milk for protein, may eat 1 meal a day that consists of a protein, starch, and a vegetable, sometimes will only have juice. 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 Pt reports scaling back exercise to 3 times per week due to busier schedule, still walking on weekends with son.    Estimated daily fluid intake: 48 oz Supplements: N/A Sleep: Sleeping well Stress / self-care: Lower!! Current average weekly physical activity: Working out 3 days   24-Hr Dietary Recall First Meal: 2 scrambled eggs, bagel w/ butter Snack:  Second Meal: Peanut butter and jelly on wheat Snack:  Third Meal: Rotisserie chicken, white rice Beverages: Water   NUTRITION DIAGNOSIS  NB-1.1 Food and nutrition-related knowledge deficit As related to diabetes, HLD.  As evidenced by A1c of 6.7%, LDL of 151, dietary history high in energy dense foods high in carbs/saturated  fats.   NUTRITION INTERVENTION  Nutrition education (E-1) on the following topics:  Educate pt on factors that can elevate LDL cholesterol, including high dietary intake of saturated fats. Educate pt on identifying sources of saturated fats, and how to make alternative food choices to lower saturated fat intake. Educate pt on the role of soluble fiber in binding to cholesterol in the GI tract an eliminating it from the body. Educate pt on dietary sources of soluble fiber.  Educate pt on the potential dietary causes of hypertriglyceridemia, including concentrated sugars and sugar sweetened beverages. Educate on the role of elevated LDL,total cholesterol, and triglycerides on cardiovascular health. Educate pt on the role of physical activity in lowering LDL and increasing HDL cholesterol. Educated patient on the pathophysiology of diabetes. This includes why our bodies need circulating blood sugar, the relationship between insulin and blood sugar, and the results of insulin resistance and/or pancreatic insufficiency on the development of diabetes. Educated patient on factors that contribute to elevation of blood sugars, such as stress, illness, injury,and food choices. Discussed the role that physical activity plays in lowering blood sugar. Educate patient on the three main macronutrients. Protein, fats, and carbohydrates. Discussed how each of these macronutrients affect blood sugar levels, especially carbohydrate, and the importance of eating a consistent amount of carbohydrate throughout the day.   Handouts Provided Include  Balanced Plate NEW: Protein Food List NEW: Carbohydrate Food list NEW: Non Starchy Vegetables food list  Learning Style & Readiness for Change Teaching method utilized: Visual & Auditory  Demonstrated degree of understanding via: Teach Back  Barriers to learning/adherence to lifestyle change: None  Goals Established  by Pt Continue to choose lean proteins (skinless poultry)  and low fat or fat free dairy to reduce saturated fat intake! Keep up the great work exercising on a regular basis! Try Barilla Chickpea pasta for a high protein pasta, use ground turkey in place of ground beef. Blend whole fruit in place of juicing, add kale for extra fiber, and soy milk for protein.   MONITORING & EVALUATION Dietary intake, weekly physical activity, and work/life balance PRN  Next Steps  Patient is to follow up for wellness visits for 2026 PRN

## 2024-06-15 NOTE — Patient Instructions (Addendum)
 Continue to choose lean proteins (skinless poultry) and low fat or fat free dairy to reduce saturated fat intake!  Keep up the great work exercising on a regular basis!  Try Barilla Chickpea pasta for a high protein pasta, use ground turkey in place of ground beef.  Blend whole fruit in place of juicing, add kale for extra fiber, and soy milk for protein.

## 2024-06-16 ENCOUNTER — Other Ambulatory Visit (HOSPITAL_COMMUNITY): Payer: Self-pay

## 2024-08-02 NOTE — Assessment & Plan Note (Signed)
 [id number] acceptable but climbing, minimize simple carbs and increase activity levels. Set patient up with glucometer to manage sugars and repeat labs in 3-4 months

## 2024-08-02 NOTE — Assessment & Plan Note (Signed)
 Encouraged DASH or MIND diet, decrease po intake and increase exercise as tolerated. Needs 7-8 hours of sleep nightly. Avoid trans fats, eat small, frequent meals every 4-5 hours with lean proteins, complex carbs and healthy fats. Minimize simple carbs, high fat foods and processed foods

## 2024-08-02 NOTE — Progress Notes (Unsigned)
 Subjective:    Patient ID: Sandy Love, female    DOB: 02/03/1981, 43 y.o.   MRN: [id number]  No chief complaint on file.   HPI Discussed the use of AI scribe software for clinical note transcription with the patient, who gave verbal consent to proceed.  History of Present Illness Sandy Love is a 43 year old female who presents for a follow-up on weight management and medication options.  She is focused on managing her weight and elevated cholesterol levels. Recent lab work indicated high cholesterol, and she is currently managing her condition without medication due to a family history of bone disease.  She has a history of elevated A1c levels, with a previous reading of 6.7. Her A1c has since decreased. She is not on any diabetes medication but is exploring options that could assist with weight management.  She is actively working on her nutrition and has started juicing, focusing on fruits and vegetables like blueberries, kale, spinach, apples, and pineapple. She is also seeing a nutritionist to guide her dietary choices and is mindful of fruit choices due to their sugar content.  She is considering increasing her physical activity and has access to a local gym, which she plans to utilize more consistently. She acknowledges the challenge of maintaining consistency in her exercise routine. She has a history of being an athlete but does not enjoy gym workouts, although she recognizes their therapeutic value.  No current gastrointestinal symptoms.    Past Medical History:  Diagnosis Date   History of miscarriage 09/20/2014   X 3     Obesity 09/20/2014   Pedal edema 02/27/2015   Preventative health care 11/18/2014    Past Surgical History:  Procedure Laterality Date   CERVICAL CERCLAGE N/A 04/25/2019   Procedure: CERCLAGE CERVICAL;  Surgeon: Arna Ranks, MD;  Location: [facility];  Service: Gynecology;  Laterality: N/A;  request extra assist for holding retractors   CERVICAL  CERCLAGE N/A 06/13/2019   Procedure: CERCLAGE REMOVAL;  Surgeon: Kandyce Sor, MD;  Location: [facility];  Service: Gynecology;  Laterality: N/A;   DILATION AND CURETTAGE OF UTERUS      Family History  Problem Relation Age of Onset   Diabetes Maternal Grandmother    Diabetes Paternal Grandmother    Obesity Sister    Renal Disease Brother        single kidney   Dementia Paternal Grandfather    Obesity Brother    Cancer Maternal Aunt 17       breast    Social History   Socioeconomic History   Marital status: Single    Spouse name: Not on file   Number of children: Not on file   Years of education: Not on file   Highest education level: Associate degree: academic program  Occupational History   Not on file  Tobacco Use   Smoking status: Former    Types: Cigars, Cigarettes   Smokeless tobacco: Former    Quit date: 12/19/2018   Tobacco comments:    black and milds  Vaping Use   Vaping status: Never Used  Substance and Sexual Activity   Alcohol use: Not Currently    Comment: occ   Drug use: No   Sexual activity: Yes    Birth control/protection: None  Other Topics Concern   Not on file  Social History Narrative   Not on file   Social Drivers of Health   Tobacco Use: Medium Risk (06/15/2024)   Patient  History    Smoking Tobacco Use: Former    Smokeless Tobacco Use: Former    Passive Exposure: Not on Stage Manager: Not on file  Food Insecurity: No Food Insecurity (09/26/2023)   Hunger Vital Sign    Worried About Running Out of Food in the Last Year: Never true    Ran Out of Food in the Last Year: Never true  Transportation Needs: No Transportation Needs (09/26/2023)   PRAPARE - Administrator, Civil Service (Medical): No    Lack of Transportation (Non-Medical): No  Physical Activity: Insufficiently Active (09/26/2023)   Exercise Vital Sign    Days of Exercise per Week: 3 days    Minutes of Exercise per Session: 30 min  Stress: Stress  Concern Present (09/26/2023)   [employer] of Occupational Health - Occupational Stress Questionnaire    Feeling of Stress : To some extent  Social Connections: Unknown (09/26/2023)   Social Connection and Isolation Panel    Frequency of Communication with Friends and Family: Twice a week    Frequency of Social Gatherings with Friends and Family: Never    Attends Religious Services: Never    Database Administrator or Organizations: No    Attends Engineer, Structural: Not on file    Marital Status: Patient declined  Intimate Partner Violence: Not on file  Depression (PHQ2-9): Low Risk (12/09/2023)   Depression (PHQ2-9)    PHQ-2 Score: 0  Alcohol Screen: Not on file  Housing: Unknown (09/26/2023)   Housing Stability Vital Sign    Unable to Pay for Housing in the Last Year: No    Number of Times Moved in the Last Year: Not on file    Homeless in the Last Year: No  Utilities: Not on file  Health Literacy: Not on file    Outpatient Medications Prior to Visit  Medication Sig Dispense Refill   fluconazole  (DIFLUCAN ) 150 MG tablet Take 1 tablet PO once. Repeat at end of antibiotic course. 2 tablet 0   ipratropium (ATROVENT ) 0.03 % nasal spray Place 2 sprays into both nostrils every 12 (twelve) hours. 30 mL 0   No facility-administered medications prior to visit.    Allergies[1]  Review of Systems  Constitutional:  Negative for fever and malaise/fatigue.  HENT:  Negative for congestion.   Eyes:  Negative for blurred vision.  Respiratory:  Negative for shortness of breath.   Cardiovascular:  Negative for chest pain, palpitations and leg swelling.  Gastrointestinal:  Negative for abdominal pain, blood in stool and nausea.  Genitourinary:  Negative for dysuria and frequency.  Musculoskeletal:  Negative for falls.  Skin:  Negative for rash.  Neurological:  Negative for dizziness, loss of consciousness and headaches.  Endo/Heme/Allergies:  Negative for environmental allergies.   Psychiatric/Behavioral:  Negative for depression. The patient is not nervous/anxious.        Objective:    Physical Exam Constitutional:      General: She is not in acute distress.    Appearance: Normal appearance. She is well-developed. She is not toxic-appearing.  HENT:     Head: Normocephalic and atraumatic.     Right Ear: External ear normal.     Left Ear: External ear normal.     Nose: Nose normal.  Eyes:     General:        Right eye: No discharge.        Left eye: No discharge.     Conjunctiva/sclera: Conjunctivae normal.  Neck:     Thyroid : No thyromegaly.  Cardiovascular:     Rate and Rhythm: Normal rate and regular rhythm.     Heart sounds: Normal heart sounds. No murmur heard. Pulmonary:     Effort: Pulmonary effort is normal. No respiratory distress.     Breath sounds: Normal breath sounds.  Abdominal:     General: Bowel sounds are normal.     Palpations: Abdomen is soft.     Tenderness: There is no abdominal tenderness. There is no guarding.  Musculoskeletal:        General: Normal range of motion.     Cervical back: Neck supple.  Lymphadenopathy:     Cervical: No cervical adenopathy.  Skin:    General: Skin is warm and dry.  Neurological:     Mental Status: She is alert and oriented to person, place, and time.  Psychiatric:        Mood and Affect: Mood normal.        Behavior: Behavior normal.        Thought Content: Thought content normal.        Judgment: Judgment normal.    There were no vitals taken for this visit. Wt Readings from Last 3 Encounters:  09/26/23 (!) 349 lb 9.6 oz (158.6 kg)  08/16/22 (!) 329 lb 9.6 oz (149.5 kg)  08/10/21 291 lb 9.6 oz (132.3 kg)    Diabetic Foot Exam - Simple   No data filed    Lab Results  Component Value Date   WBC 5.9 09/26/2023   HGB 12.6 09/26/2023   HCT 38.4 09/26/2023   PLT 278.0 09/26/2023   GLUCOSE 85 09/26/2023   CHOL 211 (H) 04/24/2024   TRIG 64.0 04/24/2024   HDL 47.80 04/24/2024    LDLCALC 151 (H) 04/24/2024   ALT 18 09/26/2023   AST 15 09/26/2023   NA 137 09/26/2023   K 4.7 09/26/2023   CL 101 09/26/2023   CREATININE 0.88 09/26/2023   BUN 10 09/26/2023   CO2 25 09/26/2023   TSH 2.35 09/26/2023   HGBA1C 6.4 04/24/2024    Lab Results  Component Value Date   TSH 2.35 09/26/2023   Lab Results  Component Value Date   WBC 5.9 09/26/2023   HGB 12.6 09/26/2023   HCT 38.4 09/26/2023   MCV 86.0 09/26/2023   PLT 278.0 09/26/2023   Lab Results  Component Value Date   NA 137 09/26/2023   K 4.7 09/26/2023   CO2 25 09/26/2023   GLUCOSE 85 09/26/2023   BUN 10 09/26/2023   CREATININE 0.88 09/26/2023   BILITOT 0.4 09/26/2023   ALKPHOS 58 09/26/2023   AST 15 09/26/2023   ALT 18 09/26/2023   PROT 6.9 09/26/2023   ALBUMIN 3.9 09/26/2023   CALCIUM  9.0 09/26/2023   ANIONGAP 7 06/08/2019   GFR 81.38 09/26/2023   Lab Results  Component Value Date   CHOL 211 (H) 04/24/2024   Lab Results  Component Value Date   HDL 47.80 04/24/2024   Lab Results  Component Value Date   LDLCALC 151 (H) 04/24/2024   Lab Results  Component Value Date   TRIG 64.0 04/24/2024   Lab Results  Component Value Date   CHOLHDL 4 04/24/2024   Lab Results  Component Value Date   HGBA1C 6.4 04/24/2024       Assessment & Plan:  Morbid obesity (HCC) Assessment & Plan: Encouraged DASH or MIND diet, decrease po intake and increase exercise as tolerated. Needs 7-8  hours of sleep nightly. Avoid trans fats, eat small, frequent meals every 4-5 hours with lean proteins, complex carbs and healthy fats. Minimize simple carbs, high fat foods and processed foods.    Hyperlipidemia, unspecified hyperlipidemia type Assessment & Plan: Encourage heart healthy diet such as MIND or DASH diet, increase exercise, avoid trans fats, simple carbohydrates and processed foods, consider a krill or fish or flaxseed oil cap daily. Start Atorvastatin 10 mg daily if patient willing   Diabetes mellitus  without complication (HCC) Assessment & Plan: hgba1c acceptable but climbing, minimize simple carbs and increase activity levels. Set patient up with glucometer to manage sugars and repeat labs in 3-4 months     Assessment and Plan Assessment & Plan Morbid obesity Chronic condition with ongoing weight management challenges. She is considering pharmacological intervention with Mounjaro , a GLP-1 receptor agonist, for weight loss. Concerns about long-term use and side effects, particularly gastrointestinal, were discussed. Emphasized the importance of lifestyle changes, including diet and exercise, to maintain weight loss. Mounjaro  is expected to be used for several months to a year, with dose titration based on tolerance and effectiveness. Insurance coverage is available through the diabetes pathway due to previous A1c levels. - Initiated Mounjaro  at the lowest dose with gradual titration based on tolerance and effectiveness. - Ordered baseline blood work including cholesterol, glucose, and kidney function before starting Mounjaro . - Scheduled follow-up in three months to assess medication tolerance and effectiveness. - Encouraged lifestyle modifications including diet and exercise.  Type 2 diabetes mellitus Previously diagnosed with an A1c of 6.7, indicating diet-controlled diabetes. Current A1c has improved, suggesting remission. Eligible for Mounjaro  through the diabetes pathway due to previous A1c levels. Discussed the potential for weight loss to improve glycemic control and overall health outcomes. - Continue monitoring A1c levels. - Encouraged dietary management and lifestyle changes to maintain glycemic control.  Hyperlipidemia Chronic condition with recent labs showing slightly elevated cholesterol levels. She is against medication due to family history of bone disease. Discussed lifestyle modifications as a primary management strategy. - Encouraged dietary modifications to manage  cholesterol levels. - Will monitor lipid levels with regular blood work.  Recording duration: 22 minutes     Harlene Horton, MD    [1] No Known Allergies

## 2024-08-02 NOTE — Assessment & Plan Note (Signed)
 Encourage heart healthy diet such as MIND or DASH diet, increase exercise, avoid trans fats, simple carbohydrates and processed foods, consider a krill or fish or flaxseed oil cap daily. Start Atorvastatin 10 mg daily if patient willing

## 2024-08-06 ENCOUNTER — Ambulatory Visit: Admitting: Family Medicine

## 2024-08-06 ENCOUNTER — Encounter: Payer: Self-pay | Admitting: Family Medicine

## 2024-08-06 ENCOUNTER — Other Ambulatory Visit: Payer: Self-pay

## 2024-08-06 DIAGNOSIS — E119 Type 2 diabetes mellitus without complications: Secondary | ICD-10-CM

## 2024-08-06 DIAGNOSIS — E785 Hyperlipidemia, unspecified: Secondary | ICD-10-CM

## 2024-08-06 LAB — CBC WITH DIFFERENTIAL/PLATELET
Basophils Absolute: 0 K/uL (ref 0.0–0.1)
Basophils Relative: 1 % (ref 0.0–3.0)
Eosinophils Absolute: 0.1 K/uL (ref 0.0–0.7)
Eosinophils Relative: 1.9 % (ref 0.0–5.0)
HCT: 37.5 % (ref 36.0–46.0)
Hemoglobin: 12.1 g/dL (ref 12.0–15.0)
Lymphocytes Relative: 35.2 % (ref 12.0–46.0)
Lymphs Abs: 1.5 K/uL (ref 0.7–4.0)
MCHC: 32.4 g/dL (ref 30.0–36.0)
MCV: 83.6 fl (ref 78.0–100.0)
Monocytes Absolute: 0.3 K/uL (ref 0.1–1.0)
Monocytes Relative: 7.7 % (ref 3.0–12.0)
Neutro Abs: 2.3 K/uL (ref 1.4–7.7)
Neutrophils Relative %: 54.2 % (ref 43.0–77.0)
Platelets: 279 K/uL (ref 150.0–400.0)
RBC: 4.48 Mil/uL (ref 3.87–5.11)
RDW: 15 % (ref 11.5–15.5)
WBC: 4.3 K/uL (ref 4.0–10.5)

## 2024-08-06 LAB — LIPID PANEL
Cholesterol: 196 mg/dL (ref 28–200)
HDL: 49.7 mg/dL (ref >39.00)
LDL Cholesterol: 135 mg/dL — ABNORMAL HIGH (ref 10–99)
NonHDL: 146.79
Total CHOL/HDL Ratio: 4
Triglycerides: 57 mg/dL (ref 10.0–149.0)
VLDL: 11.4 mg/dL (ref 0.0–40.0)

## 2024-08-06 LAB — COMPREHENSIVE METABOLIC PANEL WITH GFR
ALT: 12 U/L (ref 3–35)
AST: 14 U/L (ref 5–37)
Albumin: 4 g/dL (ref 3.5–5.2)
Alkaline Phosphatase: 57 U/L (ref 39–117)
BUN: 13 mg/dL (ref 6–23)
CO2: 27 meq/L (ref 19–32)
Calcium: 8.8 mg/dL (ref 8.4–10.5)
Chloride: 103 meq/L (ref 96–112)
Creatinine, Ser: 0.86 mg/dL (ref 0.40–1.20)
GFR: 83.16 mL/min (ref >60.00)
Glucose, Bld: 91 mg/dL (ref 70–99)
Potassium: 4.3 meq/L (ref 3.5–5.1)
Sodium: 138 meq/L (ref 135–145)
Total Bilirubin: 0.4 mg/dL (ref 0.2–1.2)
Total Protein: 7.1 g/dL (ref 6.0–8.3)

## 2024-08-06 LAB — HEMOGLOBIN A1C: Hgb A1c MFr Bld: 6.3 % (ref 4.6–6.5)

## 2024-08-06 LAB — TSH: TSH: 2.3 u[IU]/mL (ref 0.35–5.50)

## 2024-08-06 MED ORDER — TIRZEPATIDE 2.5 MG/0.5ML ~~LOC~~ SOAJ
2.5000 mg | SUBCUTANEOUS | 1 refills | Status: DC
  Filled 2024-08-06 (×2): qty 2, 28d supply, fill #0

## 2024-08-06 NOTE — Patient Instructions (Signed)

## 2024-08-07 ENCOUNTER — Ambulatory Visit: Payer: Self-pay | Admitting: Family Medicine

## 2024-08-07 ENCOUNTER — Other Ambulatory Visit: Payer: Self-pay

## 2024-08-07 NOTE — Progress Notes (Signed)
 Patient reviewed via MyChart.

## 2024-08-26 ENCOUNTER — Encounter: Payer: Self-pay | Admitting: Family Medicine

## 2024-08-26 ENCOUNTER — Other Ambulatory Visit: Payer: Self-pay | Admitting: Family

## 2024-08-26 MED ORDER — TIRZEPATIDE 5 MG/0.5ML ~~LOC~~ SOAJ
5.0000 mg | SUBCUTANEOUS | 1 refills | Status: DC
  Filled 2024-08-26: qty 2, 28d supply, fill #0

## 2024-08-27 ENCOUNTER — Other Ambulatory Visit (HOSPITAL_COMMUNITY): Payer: Self-pay

## 2024-09-02 ENCOUNTER — Other Ambulatory Visit (HOSPITAL_COMMUNITY): Payer: Self-pay

## 2024-09-04 ENCOUNTER — Other Ambulatory Visit (HOSPITAL_COMMUNITY): Payer: Self-pay

## 2024-09-07 ENCOUNTER — Other Ambulatory Visit: Payer: Self-pay | Admitting: Family

## 2024-09-07 ENCOUNTER — Encounter (HOSPITAL_COMMUNITY): Payer: Self-pay

## 2024-09-07 ENCOUNTER — Other Ambulatory Visit (HOSPITAL_COMMUNITY): Payer: Self-pay

## 2024-09-07 MED ORDER — TIRZEPATIDE 5 MG/0.5ML ~~LOC~~ SOAJ
5.0000 mg | SUBCUTANEOUS | 1 refills | Status: DC
  Filled 2024-09-07: qty 6, 84d supply, fill #0
  Filled 2024-09-07: qty 2, 28d supply, fill #0

## 2024-09-08 ENCOUNTER — Telehealth (HOSPITAL_COMMUNITY): Payer: Self-pay

## 2024-09-08 ENCOUNTER — Other Ambulatory Visit (HOSPITAL_BASED_OUTPATIENT_CLINIC_OR_DEPARTMENT_OTHER): Payer: Self-pay

## 2024-09-08 ENCOUNTER — Other Ambulatory Visit (HOSPITAL_COMMUNITY): Payer: Self-pay

## 2024-09-08 ENCOUNTER — Other Ambulatory Visit: Payer: Self-pay

## 2024-09-08 MED ORDER — TIRZEPATIDE 5 MG/0.5ML ~~LOC~~ SOAJ
5.0000 mg | SUBCUTANEOUS | 1 refills | Status: DC
  Filled 2024-09-08: qty 2, 28d supply, fill #0

## 2024-09-08 NOTE — Addendum Note (Signed)
 Addended by: November Sypher C on: 09/08/2024 08:27 AM   Modules accepted: Orders

## 2024-09-08 NOTE — Telephone Encounter (Signed)
 Needed DX code. No PA needed at this time.

## 2024-09-16 NOTE — Assessment & Plan Note (Signed)
 Her BMI today is 46. Encouraged ongoing weight-loss efforts, as even modest reductions can significantly improve overall health. Encouraged DASH or MIND diet, decrease po intake and increase exercise as tolerated. Needs 7-8 hours of sleep nightly. Avoid trans fats, eat small, frequent meals every 4-5 hours with lean proteins, complex carbs and healthy fats. Minimize simple carbs, high fat foods and processed foods

## 2024-09-16 NOTE — Assessment & Plan Note (Signed)
 Encourage heart healthy diet such as MIND or DASH diet, increase exercise, avoid trans fats, simple carbohydrates and processed foods, consider a krill or fish or flaxseed oil cap daily.

## 2024-09-16 NOTE — Progress Notes (Signed)
 "   MyChart Video Visit    Virtual Visit via Video Note   This patient is at least at moderate risk for complications without adequate follow up. This format is felt to be most appropriate for this patient at this time. Physical exam was limited by quality of the video and audio technology used for the visit. Sandy Love, CMA was able to get the patient set up on a video visit.  Patient location: Home Patient and provider in visit Provider location: Office  I discussed the limitations of evaluation and management by telemedicine and the availability of in person appointments. The patient expressed understanding and agreed to proceed.  Visit Date: 09/17/2024  Today's healthcare provider: Harlene Horton, MD     Subjective:    Patient ID: Sandy Love, female    DOB: 1981/08/10, 44 y.o.   MRN: [id number]  Chief Complaint  Patient presents with   Medical Management of Chronic Issues    Patient presents today for a follow-up.   Quality Metric Gaps    Eye exam, foot exam,urine microalbumin, pneumococcal vaccine    HPI Discussed the use of AI scribe software for clinical note transcription with the patient, who gave verbal consent to proceed.  History of Present Illness Sandy Love is a 44 year old female who presents for a routine follow-up visit. No complaints of CP/palp/SOB/HA/congestion/fevers/GI or GU c/o. Taking meds as prescribed   She is currently managing type 2 diabetes mellitus with Mounjaro , having increased her dose to 5 mg, which she is tolerating well. No concerning high or low numbers.  She is exploring surgical options for weight management, specifically liposuction and a tummy tuck, and has a consultation scheduled with Dr. Lowery next week to discuss these options further.    Past Medical History:  Diagnosis Date   History of miscarriage 09/20/2014   X 3     Obesity 09/20/2014   Pedal edema 02/27/2015   Preventative health care 11/18/2014    Past Surgical  History:  Procedure Laterality Date   CERVICAL CERCLAGE N/A 04/25/2019   Procedure: CERCLAGE CERVICAL;  Surgeon: Arna Ranks, MD;  Location: [facility];  Service: Gynecology;  Laterality: N/A;  request extra assist for holding retractors   CERVICAL CERCLAGE N/A 06/13/2019   Procedure: CERCLAGE REMOVAL;  Surgeon: Kandyce Sor, MD;  Location: [facility];  Service: Gynecology;  Laterality: N/A;   DILATION AND CURETTAGE OF UTERUS      Family History  Problem Relation Age of Onset   Diabetes Maternal Grandmother    Diabetes Paternal Grandmother    Obesity Sister    Renal Disease Brother        single kidney   Dementia Paternal Grandfather    Obesity Brother    Cancer Maternal Aunt 34       breast    Social History   Socioeconomic History   Marital status: Single    Spouse name: Not on file   Number of children: Not on file   Years of education: Not on file   Highest education level: Associate degree: academic program  Occupational History   Not on file  Tobacco Use   Smoking status: Former    Types: Cigars, Cigarettes   Smokeless tobacco: Former    Quit date: 12/19/2018   Tobacco comments:    black and milds  Vaping Use   Vaping status: Never Used  Substance and Sexual Activity   Alcohol use: Not Currently    Comment: occ  Drug use: No   Sexual activity: Yes    Birth control/protection: None  Other Topics Concern   Not on file  Social History Narrative   Not on file   Social Drivers of Health   Tobacco Use: Medium Risk (09/17/2024)   Patient History    Smoking Tobacco Use: Former    Smokeless Tobacco Use: Former    Passive Exposure: Not on Actuary Strain: Not on file  Food Insecurity: No Food Insecurity (09/26/2023)   Hunger Vital Sign    Worried About Running Out of Food in the Last Year: Never true    Ran Out of Food in the Last Year: Never true  Transportation Needs: No Transportation Needs (09/26/2023)   PRAPARE - Scientist, Research (physical Sciences) (Medical): No    Lack of Transportation (Non-Medical): No  Physical Activity: Insufficiently Active (09/26/2023)   Exercise Vital Sign    Days of Exercise per Week: 3 days    Minutes of Exercise per Session: 30 min  Stress: Stress Concern Present (09/26/2023)   [employer] of Occupational Health - Occupational Stress Questionnaire    Feeling of Stress : To some extent  Social Connections: Unknown (09/26/2023)   Social Connection and Isolation Panel    Frequency of Communication with Friends and Family: Twice a week    Frequency of Social Gatherings with Friends and Family: Never    Attends Religious Services: Never    Database Administrator or Organizations: No    Attends Engineer, Structural: Not on file    Marital Status: Patient declined  Intimate Partner Violence: Not on file  Depression (PHQ2-9): Low Risk (08/06/2024)   Depression (PHQ2-9)    PHQ-2 Score: 0  Alcohol Screen: Not on file  Housing: Unknown (09/26/2023)   Housing Stability Vital Sign    Unable to Pay for Housing in the Last Year: No    Number of Times Moved in the Last Year: Not on file    Homeless in the Last Year: No  Utilities: Not on file  Health Literacy: Not on file    Outpatient Medications Prior to Visit  Medication Sig Dispense Refill   ipratropium (ATROVENT ) 0.03 % nasal spray Place 2 sprays into both nostrils every 12 (twelve) hours. 30 mL 0   tirzepatide  (MOUNJARO ) 5 MG/0.5ML Pen Inject 5 mg into the skin once a week. 6 mL 1   No facility-administered medications prior to visit.    Allergies[1]  Review of Systems  Constitutional:  Negative for fever and malaise/fatigue.  HENT:  Negative for congestion.   Eyes:  Negative for blurred vision.  Respiratory:  Negative for shortness of breath.   Cardiovascular:  Negative for chest pain, palpitations and leg swelling.  Gastrointestinal:  Negative for abdominal pain, blood in stool and nausea.  Genitourinary:  Negative for  dysuria and frequency.  Musculoskeletal:  Negative for falls.  Skin:  Negative for rash.  Neurological:  Negative for dizziness, loss of consciousness and headaches.  Endo/Heme/Allergies:  Negative for environmental allergies.  Psychiatric/Behavioral:  Negative for depression. The patient is not nervous/anxious.        Objective:    Physical Exam Constitutional:      General: She is not in acute distress.    Appearance: Normal appearance. She is not ill-appearing or toxic-appearing.  HENT:     Head: Normocephalic and atraumatic.     Right Ear: External ear normal.     Left Ear: External  ear normal.     Nose: Nose normal.  Eyes:     General:        Right eye: No discharge.        Left eye: No discharge.  Pulmonary:     Effort: Pulmonary effort is normal.  Skin:    Findings: No rash.  Neurological:     Mental Status: She is alert and oriented to person, place, and time.  Psychiatric:        Behavior: Behavior normal.     Ht 5' 9 (1.753 m)   LMP 08/28/2024   BMI 48.67 kg/m  Wt Readings from Last 3 Encounters:  08/06/24 (!) 329 lb 9.6 oz (149.5 kg)  09/26/23 (!) 349 lb 9.6 oz (158.6 kg)  08/16/22 (!) 329 lb 9.6 oz (149.5 kg)       Assessment & Plan:  Morbid obesity (HCC) Assessment & Plan: Encouraged DASH or MIND diet, decrease po intake and increase exercise as tolerated. Needs 7-8 hours of sleep nightly. Avoid trans fats, eat small, frequent meals every 4-5 hours with lean proteins, complex carbs and healthy fats. Minimize simple carbs, high fat foods and processed foods.    Hyperlipidemia, unspecified hyperlipidemia type Assessment & Plan: Encourage heart healthy diet such as MIND or DASH diet, increase exercise, avoid trans fats, simple carbohydrates and processed foods, consider a krill or fish or flaxseed oil cap daily.    Other orders -     Tirzepatide ; Inject 7.5 mg into the skin once a week.  Dispense: 6 mL; Refill: 1     Assessment and  Plan Assessment & Plan Morbid obesity Considering surgical options for weight management, including tummy tuck and liposuction. Discussed differences between procedures and potential risks. Consultation with Dr. Lowery scheduled for next week. - Proceed with consultation with Dr. Lowery for surgical options.  Type 2 diabetes mellitus Currently on Mounjaro , tolerating 5 mg dose well. Plan to increase to 7.5 mg after four weeks if tolerated, with potential further increase to 10 mg. - Increase Mounjaro  to 7.5 mg after four weeks if tolerated. - Will consider increasing Mounjaro  to 10 mg after four weeks if tolerated.  Recording duration: 7 minutes     I discussed the assessment and treatment plan with the patient. The patient was provided an opportunity to ask questions and all were answered. The patient agreed with the plan and demonstrated an understanding of the instructions.   The patient was advised to call back or seek an in-person evaluation if the symptoms worsen or if the condition fails to improve as anticipated.  Harlene Horton, MD [facility] [phone] (phone) [phone] (fax)  [facility]     [1] No Known Allergies  "

## 2024-09-17 ENCOUNTER — Other Ambulatory Visit (HOSPITAL_COMMUNITY): Payer: Self-pay

## 2024-09-17 ENCOUNTER — Telehealth (INDEPENDENT_AMBULATORY_CARE_PROVIDER_SITE_OTHER): Admitting: Family Medicine

## 2024-09-17 ENCOUNTER — Encounter: Payer: Self-pay | Admitting: Family Medicine

## 2024-09-17 DIAGNOSIS — E785 Hyperlipidemia, unspecified: Secondary | ICD-10-CM

## 2024-09-17 MED ORDER — TIRZEPATIDE 7.5 MG/0.5ML ~~LOC~~ SOAJ
7.5000 mg | SUBCUTANEOUS | 1 refills | Status: AC
  Filled 2024-09-17: qty 6, 84d supply, fill #0

## 2024-09-18 ENCOUNTER — Encounter: Payer: Self-pay | Admitting: Family Medicine

## 2024-09-22 ENCOUNTER — Institutional Professional Consult (permissible substitution): Payer: Self-pay | Admitting: Plastic Surgery

## 2024-11-05 ENCOUNTER — Ambulatory Visit: Admitting: Family Medicine

## 2024-11-12 ENCOUNTER — Encounter: Admitting: Family Medicine

## 2024-11-19 ENCOUNTER — Ambulatory Visit: Admitting: Family Medicine

## 2024-12-11 ENCOUNTER — Encounter: Admitting: Student
